# Patient Record
Sex: Female | Born: 1945
Health system: Southern US, Community
[De-identification: ages and names within clinical notes are randomized; demographics above are authoritative.]

## PROBLEM LIST (undated history)

## (undated) DIAGNOSIS — J300 Vasomotor rhinitis: Secondary | ICD-10-CM

## (undated) DIAGNOSIS — F419 Anxiety disorder, unspecified: Secondary | ICD-10-CM

## (undated) DIAGNOSIS — E7800 Pure hypercholesterolemia, unspecified: Secondary | ICD-10-CM

## (undated) DIAGNOSIS — K900 Celiac disease: Secondary | ICD-10-CM

## (undated) DIAGNOSIS — I959 Hypotension, unspecified: Secondary | ICD-10-CM

## (undated) DIAGNOSIS — J309 Allergic rhinitis, unspecified: Secondary | ICD-10-CM

## (undated) DIAGNOSIS — M1990 Unspecified osteoarthritis, unspecified site: Secondary | ICD-10-CM

## (undated) HISTORY — DX: Hypotension, unspecified: I95.9

## (undated) HISTORY — DX: Vasomotor rhinitis: J30.0

## (undated) HISTORY — DX: Anxiety disorder, unspecified: F41.9

## (undated) HISTORY — DX: Allergic rhinitis, unspecified: J30.9

## (undated) HISTORY — DX: Pure hypercholesterolemia, unspecified: E78.00

---

## 1979-01-06 HISTORY — PX: TUBAL LIGATION: SHX77

## 2001-01-05 HISTORY — PX: GANGLION CYST EXCISION: SHX1691

## 2001-05-20 ENCOUNTER — Other Ambulatory Visit: Admission: RE | Admit: 2001-05-20 | Discharge: 2001-05-20 | Payer: Self-pay | Admitting: Family Medicine

## 2002-08-08 ENCOUNTER — Encounter: Payer: Self-pay | Admitting: Gastroenterology

## 2002-08-29 ENCOUNTER — Encounter: Payer: Self-pay | Admitting: Gastroenterology

## 2002-08-29 ENCOUNTER — Encounter (INDEPENDENT_AMBULATORY_CARE_PROVIDER_SITE_OTHER): Payer: Self-pay | Admitting: *Deleted

## 2002-08-29 ENCOUNTER — Encounter: Admission: RE | Admit: 2002-08-29 | Discharge: 2002-08-29 | Payer: Self-pay | Admitting: Gastroenterology

## 2002-10-25 ENCOUNTER — Encounter (INDEPENDENT_AMBULATORY_CARE_PROVIDER_SITE_OTHER): Payer: Self-pay | Admitting: Specialist

## 2002-10-25 ENCOUNTER — Encounter (INDEPENDENT_AMBULATORY_CARE_PROVIDER_SITE_OTHER): Payer: Self-pay | Admitting: *Deleted

## 2002-10-25 ENCOUNTER — Encounter: Payer: Self-pay | Admitting: Gastroenterology

## 2002-10-25 ENCOUNTER — Ambulatory Visit (HOSPITAL_COMMUNITY): Admission: RE | Admit: 2002-10-25 | Discharge: 2002-10-25 | Payer: Self-pay | Admitting: Gastroenterology

## 2003-01-01 ENCOUNTER — Encounter (INDEPENDENT_AMBULATORY_CARE_PROVIDER_SITE_OTHER): Payer: Self-pay | Admitting: *Deleted

## 2003-01-01 ENCOUNTER — Observation Stay (HOSPITAL_COMMUNITY): Admission: RE | Admit: 2003-01-01 | Discharge: 2003-01-02 | Payer: Self-pay | Admitting: Surgery

## 2003-01-01 ENCOUNTER — Encounter (INDEPENDENT_AMBULATORY_CARE_PROVIDER_SITE_OTHER): Payer: Self-pay

## 2003-01-01 ENCOUNTER — Encounter: Payer: Self-pay | Admitting: Gastroenterology

## 2003-01-06 HISTORY — PX: GALLBLADDER SURGERY: SHX652

## 2004-08-29 ENCOUNTER — Other Ambulatory Visit: Admission: RE | Admit: 2004-08-29 | Discharge: 2004-08-29 | Payer: Self-pay | Admitting: Family Medicine

## 2006-01-08 ENCOUNTER — Other Ambulatory Visit: Admission: RE | Admit: 2006-01-08 | Discharge: 2006-01-08 | Payer: Self-pay | Admitting: Family Medicine

## 2006-08-26 ENCOUNTER — Encounter: Admission: RE | Admit: 2006-08-26 | Discharge: 2006-08-26 | Payer: Self-pay | Admitting: Otolaryngology

## 2007-03-10 ENCOUNTER — Other Ambulatory Visit: Admission: RE | Admit: 2007-03-10 | Discharge: 2007-03-10 | Payer: Self-pay | Admitting: Family Medicine

## 2007-04-29 ENCOUNTER — Emergency Department (HOSPITAL_COMMUNITY): Admission: EM | Admit: 2007-04-29 | Discharge: 2007-04-29 | Payer: Self-pay | Admitting: Emergency Medicine

## 2007-05-06 ENCOUNTER — Encounter: Admission: RE | Admit: 2007-05-06 | Discharge: 2007-05-06 | Payer: Self-pay | Admitting: Family Medicine

## 2007-05-10 ENCOUNTER — Encounter: Payer: Self-pay | Admitting: Gastroenterology

## 2007-05-11 ENCOUNTER — Encounter: Payer: Self-pay | Admitting: Gastroenterology

## 2007-05-18 ENCOUNTER — Encounter (INDEPENDENT_AMBULATORY_CARE_PROVIDER_SITE_OTHER): Payer: Self-pay | Admitting: *Deleted

## 2007-05-18 ENCOUNTER — Ambulatory Visit (HOSPITAL_COMMUNITY): Admission: RE | Admit: 2007-05-18 | Discharge: 2007-05-18 | Payer: Self-pay | Admitting: Gastroenterology

## 2007-05-26 ENCOUNTER — Encounter: Payer: Self-pay | Admitting: Gastroenterology

## 2007-06-08 ENCOUNTER — Encounter: Payer: Self-pay | Admitting: Gastroenterology

## 2007-06-24 ENCOUNTER — Encounter: Payer: Self-pay | Admitting: Gastroenterology

## 2007-07-14 ENCOUNTER — Encounter: Payer: Self-pay | Admitting: Gastroenterology

## 2007-08-03 ENCOUNTER — Encounter: Admission: RE | Admit: 2007-08-03 | Discharge: 2007-08-03 | Payer: Self-pay | Admitting: Gastroenterology

## 2007-08-03 ENCOUNTER — Encounter (INDEPENDENT_AMBULATORY_CARE_PROVIDER_SITE_OTHER): Payer: Self-pay | Admitting: *Deleted

## 2008-01-27 ENCOUNTER — Encounter: Admission: RE | Admit: 2008-01-27 | Discharge: 2008-01-27 | Payer: Self-pay | Admitting: Family Medicine

## 2008-09-07 ENCOUNTER — Encounter: Admission: RE | Admit: 2008-09-07 | Discharge: 2008-09-07 | Payer: Self-pay | Admitting: Family Medicine

## 2008-10-15 ENCOUNTER — Other Ambulatory Visit: Admission: RE | Admit: 2008-10-15 | Discharge: 2008-10-15 | Payer: Self-pay | Admitting: Family Medicine

## 2008-10-23 ENCOUNTER — Encounter: Admission: RE | Admit: 2008-10-23 | Discharge: 2008-10-23 | Payer: Self-pay | Admitting: Family Medicine

## 2009-04-05 ENCOUNTER — Encounter: Admission: RE | Admit: 2009-04-05 | Discharge: 2009-04-05 | Payer: Self-pay | Admitting: Family Medicine

## 2009-04-10 ENCOUNTER — Encounter: Admission: RE | Admit: 2009-04-10 | Discharge: 2009-04-10 | Payer: Self-pay | Admitting: Family Medicine

## 2009-11-18 ENCOUNTER — Encounter: Payer: Self-pay | Admitting: Gastroenterology

## 2009-12-18 ENCOUNTER — Encounter: Payer: Self-pay | Admitting: Gastroenterology

## 2010-01-07 ENCOUNTER — Encounter: Payer: Self-pay | Admitting: Gastroenterology

## 2010-01-27 ENCOUNTER — Encounter: Payer: Self-pay | Admitting: Gastroenterology

## 2010-02-06 NOTE — Letter (Signed)
Summary: New Patient letter  [facility]  [address] [place], [place] [address]   Phone: [phone]  Fax: [phone]       01/07/2010 MRN: [id number]  Shelly [name] [address] [place], [place]  [address]  Dear Ms. Kleist,  Welcome to the Gastroenterology Division at [facility].    You are scheduled to see Dr.  Russella Dar on 02-13-10 at 11am on the 3rd floor at [facility], [address]  We ask that you try to arrive at our office 15 minutes prior to your appointment time to allow for check-in.  We would like you to complete the enclosed self-administered evaluation form prior to your visit and bring it with you on the day of your appointment.  We will review it with you.  Also, please bring a complete list of all your medications or, if you prefer, bring the medication bottles and we will list them.  Please bring your insurance card so that we may make a copy of it.  If your insurance requires a referral to see a specialist, please bring your referral form from your primary care physician.  Co-payments are due at the time of your visit and may be paid by cash, check or credit card.     Your office visit will consist of a consult with your physician (includes a physical exam), any laboratory testing he/she may order, scheduling of any necessary diagnostic testing (e.g. x-ray, ultrasound, CT-scan), and scheduling of a procedure (e.g. Endoscopy, Colonoscopy) if required.  Please allow enough time on your schedule to allow for any/all of these possibilities.    If you cannot keep your appointment, please call [phone] to cancel or reschedule prior to your appointment date.  This allows [place] the opportunity to schedule an appointment for another patient in need of care.  If you do not cancel or reschedule by 5 p.m. the business day prior to your appointment date, you will be charged a $50.00 late cancellation/no-show fee.    Thank you for choosing [facility] for your medical needs.  We appreciate the opportunity to care for you.  Please visit [place] at our website  to learn more about our practice.                     Sincerely,                                                             The Gastroenterology Division

## 2010-02-13 ENCOUNTER — Ambulatory Visit (INDEPENDENT_AMBULATORY_CARE_PROVIDER_SITE_OTHER): Payer: BC Managed Care – PPO | Admitting: Gastroenterology

## 2010-02-13 ENCOUNTER — Encounter: Payer: Self-pay | Admitting: Gastroenterology

## 2010-02-13 DIAGNOSIS — R1033 Periumbilical pain: Secondary | ICD-10-CM

## 2010-02-13 DIAGNOSIS — R197 Diarrhea, unspecified: Secondary | ICD-10-CM | POA: Insufficient documentation

## 2010-02-13 DIAGNOSIS — F329 Major depressive disorder, single episode, unspecified: Secondary | ICD-10-CM | POA: Insufficient documentation

## 2010-02-13 DIAGNOSIS — F411 Generalized anxiety disorder: Secondary | ICD-10-CM | POA: Insufficient documentation

## 2010-02-20 NOTE — Assessment & Plan Note (Signed)
Summary: ABD APIN/YF - SAW DR MANN 2 YRS AGO DR GAGES WANTS HER TO SEE...   History of Present Illness Visit Type: Initial Consult Primary GI MD: Elie Goody MD [facility] Primary Provider: Shaune Pollack, MD Requesting Provider: Shaune Pollack, MD Chief Complaint: Patient c/o periumbilical abdominal pain as well as diarrhea. There has also been some nausea History of Present Illness:   This is a 65 year old female here today with her husband for a second opinion involving chronic, intermittent diarrhea, and abdominal pain. She's been followed by Dr. Loreta Ave for many years and has undergone extensive evaluations in 2004 and in 2009. I reviewed the results of the 2004 evaluation. However, I do not have any records from 2009 at this time. Patient states the 2009 workup included stool studies, celiac antibodies, colonoscopy and upper endoscopy. Recent stool studies and CMET per Dr. Kevan Ny were entirely unremarkable. Patient states she is intolerant to gluten, and since beginning a gluten-free diet her continuous, chronic diarrhea has abated.  Recently she has episodes about once per month of loose, watery, nonbloody diarrhea associated with periumbilical pain and nausea. Her symptoms tend to last for several days, then completely abate and then she is completely asymptomatic. She has been told of irritable bowel syndrome in the past.   GI Review of Systems    Reports abdominal pain, belching, loss of appetite, nausea, and  vomiting.     Location of  Abdominal pain: periumbilical.    Denies acid reflux, bloating, chest pain, dysphagia with liquids, dysphagia with solids, heartburn, vomiting blood, weight loss, and  weight gain.      Reports diarrhea.     Denies anal fissure, black tarry stools, change in bowel habit, constipation, diverticulosis, fecal incontinence, heme positive stool, hemorrhoids, irritable bowel syndrome, jaundice, light color stool, liver problems, rectal bleeding, and  rectal pain.    Current Medications (verified): 1)  Multivitamins   Tabs (Multiple Vitamin) .... One Tablet By Mouth Once Daily 2)  Vitamin D3 2000 Unit Caps (Cholecalciferol) .... One Capsule By Mouth Every Other Day 3)  Sudafed 30 Mg Tabs (Pseudoephedrine Hcl) .... 1/2 Tablet By Mouth As Needed 4)  Zofran 4 Mg Tabs (Ondansetron Hcl) .... One Tablet By Mouth As Needed 5)  Astepro 0.15 % Soln (Azelastine Hcl) .... One Spray in Each Nostril As Needed 6)  Ipratropium Bromide 0.03 % Soln (Ipratropium Bromide) .... 2 Applications in Each Nostril As Needed 7)  Zithromax Z-Pak 250 Mg Tabs (Azithromycin) .... Use As Directed 8)  Progesterone Cream .... Apply Per Vagina Every Night  Allergies: 1)  ! Penicillin 2)  ! * Levbid 3)  ! Prednisone 4)  ! Cortisone 5)  ! Sulfa 6)  ! * Gluten 7)  ! * Clindamycin  Past History:  Past Medical History: Reviewed history from 02/12/2010 and no changes required. Anxiety Disorder Depression Hyperlipidemia Vitamin D deficiency Allergic rhinitis Borderline delayed gastric emptying Hemorrhoids  Past Surgical History: Cholecystectomy 12/2002 Tubal Ligation Left wrist ganglion cyst removal  Family History: Reviewed history from 02/12/2010 and no changes required. Father: deceased 55 years prostate cancer Mother: deceased 78 years stroke, elevated cholesterol No FH of Colon Cancer:  Social History: Single and Widowed Retired Patient currently smokes. 4 cigs daily Alcohol Use - no Daily Caffeine Use 1 cups daily Illicit Drug Use - no  Review of Systems       The patient complains of allergy/sinus, back pain, cough, fatigue, and night sweats.  The pertinent positives and negatives are noted as above and in the HPI. All other ROS were reviewed and were negative.   Vital Signs:  Patient profile:   65 year old female Height:      65 inches Weight:      123.25 pounds BMI:     20.58 BSA:     1.61 Pulse rate:   100 / minute Pulse rhythm:    regular BP sitting:   96 / 64  (left arm)  Vitals Entered By: Lamona Curl CMA Duncan Dull) (February 13, 2010 10:51 AM)  Physical Exam  General:  Well developed, well nourished, no acute distress. Head:  Normocephalic and atraumatic. Eyes:  PERRLA, no icterus. Ears:  Normal auditory acuity. Mouth:  No deformity or lesions, dentition normal. Neck:  Supple; no masses or thyromegaly. Lungs:  Clear throughout to auscultation. Heart:  Regular rate and rhythm; no murmurs, rubs,  or bruits. Abdomen:  Soft, nontender and nondistended. No masses, hepatosplenomegaly or hernias noted. Normal bowel sounds. Msk:  Symmetrical with no gross deformities. Normal posture. Pulses:  Normal pulses noted. Extremities:  No clubbing, cyanosis, edema or deformities noted. Neurologic:  Alert and  oriented x4;  grossly normal neurologically. Cervical Nodes:  No significant cervical adenopathy. Inguinal Nodes:  No significant inguinal adenopathy. Psych:  Alert and cooperative. Anxious.    Impression & Recommendations:  Problem # 1:  DIARRHEA (ICD-787.91) Chronic, intermittent diarrhea. Previous continuous diarrhea resolved on a gluten-free diet. Requests records from Dr. Loreta Ave for further review before proceeding with any diagnostic testing. It seems quite likely that she has underlying irritable bowel syndrome and I recommended beginning an anti-spasmodic. She seems quite resistant to this diagnosis and is very skeptical that something else have been overlooked. She is reluctant to begin any medication because she states she frequently has side effects to "adult doses" of medications. Trial of glycopyrrolate 1 mg twice daily. Consider a trial probiotics. Consider further diagnostic evaluation and consider referral to a tertiary Medical Center based on review of Dr. Kenna Gilbert prior workup.  Problem # 2:  ABDOMINAL PAIN-PERIUMBILICAL (ICD-789.05) past intermittent periumbilical abdominal pain, associated with  diarrhea and nausea. Given the extensive evaluation to date. I think irritable bowel syndrome or functional abdominal pain is quite likely. Will consider further evaluation and consider referral to a tertiary medical center after review with Dr. Kenna Gilbert records.  Problem # 3:  ANXIETY (ICD-300.00)  Problem # 4:  DEPRESSION (ICD-311)  Patient Instructions: 1)  Pick up your prescription from your pharmacy.  2)  Faxed release of information to Dr. Kenna Gilbert office and we will obtain records of prior labs, x-rays, and procedure reports and pathologies. We will contact you after these records are obtained for further evaluation.  3)  Copy sent to : Shaune Pollack, MD 4)  The medication list was reviewed and reconciled.  All changed / newly prescribed medications were explained.  A complete medication list was provided to the patient / caregiver.  Prescriptions: GLYCOPYRROLATE 1 MG TABS (GLYCOPYRROLATE) one tablet by mouth two times a day  #60 x 5   Entered by:   Christie Nottingham CMA (AAMA)   Authorized by:   Meryl Dare MD [facility]   Signed by:   Christie Nottingham CMA (AAMA) on 02/13/2010   Method used:   Electronically to        [facility]  [phone]* (retail)       [address]       [place]  [place], [place]  [address]       Ph: [phone] or [phone]       Fax: [phone]   RxID:   [id number] GLYCOPYRROLATE 1 MG TABS (GLYCOPYRROLATE) one tablet by mouth two times a day  #60 x 5   Entered by:   Christie Nottingham CMA (AAMA)   Authorized by:   Meryl Dare MD [facility]   Signed by:   Christie Nottingham CMA (AAMA) on 02/13/2010   Method used:   Electronically to        [facility]  [phone]* (retail)       [address]       [place], [place]  [address]       Ph: [phone] or [phone]       Fax: [phone]   RxID:   [id number]

## 2010-02-20 NOTE — Op Note (Signed)
Summary: Colonoscopy and biopsy    NAME:  Shelly Shelly [name], Shelly Shelly [name]                        ACCOUNT NO.:  [id number]   MEDICAL RECORD NO.:  [id number]                   PATIENT TYPE:  AMB   LOCATION:  ENDO                                 FACILITY:  [facility]   PHYSICIAN:  Anselmo Rod, M.D.               DATE OF BIRTH:  1945-04-19   DATE OF PROCEDURE:  10/25/2002  DATE OF DISCHARGE:                                 OPERATIVE REPORT   PROCEDURE PERFORMED:  Colonoscopy with multiple biopsies.   ENDOSCOPIST:  Charna Elizabeth, M.D.   INSTRUMENT USED:  Olympus video colonoscope.   INDICATIONS FOR PROCEDURE:  The patient is Shelly [name] 65 year old white female  undergoing screening colonoscopy.  The patient has Shelly [name] history of occasional  rectal bleeding interspersed with bouts of diarrhea.   PREPROCEDURE PREPARATION:  Informed consent was procured from the patient.  The patient was fasted for eight hours prior to the procedure and prepped  with Shelly [name] bottle of magnesium citrate and Shelly [name] gallon of GoLYTELY the night prior  to the procedure.   PREPROCEDURE PHYSICAL:  The patient had stable vital signs.  Neck supple.  Chest clear to auscultation.  S1 and S2 regular.  Abdomen soft with normal  bowel sounds.   DESCRIPTION OF PROCEDURE:  The patient was placed in left lateral decubitus  position and sedated with 40 mg of Demerol and 4 mg of Versed intravenously.  Once the patient was adequately sedated and maintained on low flow oxygen  and continuous cardiac monitoring, the Olympus video colonoscope was  advanced from the rectum to the cecum and terminal ileum without difficulty.  The appendicular orifice and ileocecal valve were clearly visualized and  photographed.  Multiple erosions were seen in the terminal ileum and these  were biopsied for pathology.  Shelly [name] small polyp was seen in the base of the  cecum.  Some erythema was noted around the appendicular orifice and this was  biopsied for pathology as well considering  the patient's history of  diarrhea.  No masses, polyps, or diverticulosis were seen in the right  colon, transverse colon, left colon.  Two small polyps were biopsied from  the rectum.  Small internal hemorrhoids were seen on retroflexion and anal  inspection respectively.  Random colon biopsies were done to rule out  colitis versus microscopic colitis.   IMPRESSION:  1. Bleeding internal and external hemorrhoids.  2. Random colon biopsies done to rule out colitis versus lymphocytic     colitis.  3. Two small polyps biopsied from the rectum.  4. Polyp biopsied from the cecal base.  Biopsies done from the appendicular     orifice.  5. Scattered erosions in terminal ileum biopsied.   RECOMMENDATIONS:  1. Await pathology results.  2. Avoid all nonsteroidals including aspirin for now.  3. Outpatient follow-up in the next two weeks for further recommendations.  4. Continue high fiber diet for  now.                                                   Anselmo Rod, M.D.    JNM/MEDQ  D:  10/25/2002  T:  10/25/2002  Job:  [id number]   cc:   Duncan Dull, M.D.  [address]  [place]  [place] [address]  Fax: [phone]       SP Surgical Pathology - STATUS: Final             By: Windy Kalata,      Perform Date: 20Oct04 00:00  Ordered By: Cassandria Anger Date: 20Oct04 15:50  Facility: [facility]                              Department: CPATH  Service Report Text  [facility]   [address]   [place], [place] [address]   [phone]    REPORT OF SURGICAL PATHOLOGY    Case #: [id number]   Patient Name: Shelly Shelly [name], Shelly Shelly [name].   PID: [id number]   Pathologist: Lyn Hollingshead. Delila Spence, MD   DOB/Age April 07, 1945 (Age: 22) Gender: F   Date Taken: 10/25/2002   Date Received: 10/25/2002    FINAL DIAGNOSIS    ***MICROSCOPIC EXAMINATION AND DIAGNOSIS***    1. COLON BIOPSY, APPENDICAL ORIFICE: NO ACTIVE MUCOSAL COLITIS,    DYSPLASIA OR MALIGNANCY IDENTIFIED.    2. TERMINAL ILEUM, BIOPSY: FRAGMENTS OF TERMINAL ILEUM AND   COLONIC TYPE EPITHELIUM WITH FOCAL ACTIVE MUCOSAL INFLAMMATION.   NO DYSPLASIA, GRANULOMAS OR MALIGNANCY IDENTIFIED.    3. COLON, BIOPSY: BENIGN COLONIC MUCOSA. NO SIGNIFICANT   INFLAMMATION OR OTHER ABNORMALITIES IDENTIFIED.    4. rectum: HYPERPLASTIC POLYP(S). NO ADENOMATOUS CHANGE OR   MALIGNANCY IDENTIFIED.    COMMENT   2. The ileocecal valve type mucosa shows focal active mucosal   inflammation with associated mucus depletion and Shelly [name] focal increase   in inflammatory cells in the lamina propria. No granulomas are   seen. These findings may be seen with Crohn's disease. However,   another important differential diagnostic consideration is NSAIDS   related mucosal injury. Clinical and endoscopic correlation would   be helpful. EAA:mw 10-25-02    3. There is colonic mucosa with normal crypt architecture and no   objective increase in inflammation. No active inflammation,   microscopic colitis, collagenous colitis or significant chronic   changes identified. No hyperplastic or adenomatous changes are   seen, and there is no evidence of malignancy.    mw   Date Reported: 10/27/2002 Alden Server Shelly [name]. Delila Spence, MD   *** Electronically Signed Out By EAA ***    Clinical information   R/O collagenous colitis, diarrhea (mw)    specimen(s) obtained   1: Colon, biopsy, appendical orifice   2: Ileum, biopsy, lesions   3: Colon, biopsy, random   4: Rectum, polyp(s)    Gross Description   1. Received in formalin is Shelly [name] tan, soft tissue fragment that is   submitted in toto. Size: 0.2 cm    2. Received in formalin are tan, soft tissue fragments that are   submitted in toto. Number: 4   Size: 0.2  to 0.4 cm    3. Received in formalin are tan, soft tissue fragments that are   submitted in toto. Number: 5   Size: 0.2 to 0.3 cm    4. Received in formalin are tan, soft tissue fragments that are    submitted in toto. Number: 2   Size: each 0.3 cm (JBM:kcv 10-20)    kv/

## 2010-02-20 NOTE — Op Note (Signed)
Summary: Laparoscopic Cholecystectomy   NAME:  Shelly, [name]                        ACCOUNT NO.:  [id number]   MEDICAL RECORD NO.:  [id number]                   PATIENT TYPE:  AMB   LOCATION:  DAY                                  FACILITY:  [facility]   PHYSICIAN:  Abigail Miyamoto, M.D.              DATE OF BIRTH:  [birth year]   DATE OF PROCEDURE:  01/01/2003  DATE OF DISCHARGE:                                 OPERATIVE REPORT   PREOPERATIVE DIAGNOSIS:  Biliary dyskinesia.   POSTOPERATIVE DIAGNOSIS:  Biliary dyskinesia.   PROCEDURE:  Laparoscopic cholecystectomy with intraoperative cholangiogram.   SURGEON:  Abigail Miyamoto, M.D.   ANESTHESIA:  General endotracheal anesthesia, 0.25% Marcaine.   ESTIMATED BLOOD LOSS:  Minimal.   FINDINGS:  The patient was found to have a normal cholangiogram.   PROCEDURE IN DETAIL:  The patient was brought to the operating room,  identified as Shelly [name].  She was placed supine on the operating table,  and general anesthesia was induced.  Her abdomen was then prepped and draped  in the usual sterile fashion.  Using a #15 blade, a small transverse  incision was made below the umbilicus.  The incision was carried down to the  fascia, which was then opened with a scalpel.  A hemostat was then used to  pass into the peritoneal cavity.  Next a 0 Vicryl pursestring suture was  placed around the fascial opening.  The Hasson port was placed through the  opening and insufflation of the abdomen was begun.  A 12 mm port was then  placed in the patient's epigastrium and two 5 mm ports were placed in the  patient's right flank under direct vision.  The gallbladder was then grasped  and retracted above the liver bed.  Dissection was then carried down to the  base of the gallbladder.  The cystic duct was easily dissected out and  clipped once distally.  It was then partly opened with the laparoscopic  scissors.  An angiocatheter was then inserted in the  right upper quadrant  under direct vision.  The cholangiocatheter was then passed through this and  placed into the opening into the cystic duct.  A cholangiogram was then  performed with contrast under direct fluoroscopy.  Contrast was seen to flow  into the entire biliary system and duodenum without evidence of  abnormalities.  The cholangiocatheter was then completely removed from the  abdomen.  The cystic duct was then clipped three times proximally and  transected with the scissors.  The cystic artery was dissected out, clipped  twice proximally, once distally, and transected with the scissors.  The  gallbladder was slowly dissected free from the liver bed with the  electrocautery.  Once it was free from the liver bed, the liver bed was  examined and hemostasis was achieved with the cautery.  The gallbladder was  then removed through the incision at the umbilicus.  The 0 Vicryl at the  umbilicus was tied in place, closing the fascial defect.  All ports were  then removed under direct vision and the abdomen was deflated.  All  incisions were anesthetized with 0.25%  Marcaine and closed with 4-0 Monocryl subcuticular sutures.  Steri-Strips,  gauze, and tape were then applied.  The patient tolerated the procedure  well.  All sponge, needle, and instrument counts were correct at the end of  the procedure.  The patient was then extubated in the operating room and  taken in stable condition to the recovery room.                                               Abigail Miyamoto, M.D.    DB/MEDQ  D:  01/01/2003  T:  01/01/2003  Job:  [id number]   cc:   Anselmo Rod, M.D.  Fax: [phone]        SP Surgical Pathology - STATUS: Final             By: Guilford Shi MD , Clovis Pu       Perform Date: 27Dec04 00:00  Ordered By: Forrestine Him Date: 27Dec04 11:15  Facility: [facility]                              Department: CPATH  Service Report Text  [facility]   [address] [place], [place] [address]   [phone]    REPORT OF SURGICAL PATHOLOGY    Case #: [id number]   Patient Name: Shelly [name], [name]   PID: [id number]   Pathologist: Marcie Bal, MD   DOB/Age Jul 04, 1945 (Age: 65) Gender: F   Date Taken: 01/01/2003   Date Received: 01/01/2003    FINAL DIAGNOSIS    ***MICROSCOPIC EXAMINATION AND DIAGNOSIS***    GALLBLADDER: MILD NONSPECIFIC CHRONIC CHOLECYSTITIS, CLINICALLY   BILIARY DYSKINESIA.    cf   Date Reported: 01/02/2003 Marcie Bal, MD   *** Electronically Signed Out By TAZ ***    Clinical information   Biliary dyskinesia. (mw)    specimen(s) obtained   Gallbladder    Gross Description   Size/?Intact: 7.3 x 3 x 1.9 cm, intact   Serosal surface: Pink, hyperemic, focally roughened   Mucosa/Wall: The mucosa is tan-green to hyperemic, smooth, soft.   Contents: 0.2 cm thick, no gross stones   Cystic duct: Patent   Block Summary: 1 block (SW:jy) 01/01/03    jy/

## 2010-02-26 NOTE — Letter (Signed)
Summary: [facility]   Imported By: Sherian Rein 02/18/2010 11:46:27  _____________________________________________________________________  External Attachment:    Type:   Image     Comment:   External Document

## 2010-02-26 NOTE — Letter (Signed)
Summary: [facility]   Imported By: Sherian Rein 02/18/2010 11:43:10  _____________________________________________________________________  External Attachment:    Type:   Image     Comment:   External Document

## 2010-03-13 NOTE — Letter (Signed)
Summary: [facility]  [facility]   Imported By: Sherian Rein 03/07/2010 07:13:08  _____________________________________________________________________  External Attachment:    Type:   Image     Comment:   External Document

## 2010-03-13 NOTE — Procedures (Signed)
Summary: EGD/[facility]  EGD/[facility]   Imported By: Sherian Rein 03/07/2010 07:12:13  _____________________________________________________________________  External Attachment:    Type:   Image     Comment:   External Document

## 2010-03-13 NOTE — Procedures (Signed)
Summary: Colonoscopy/[facility]  Colonoscopy/[facility]   Imported By: Sherian Rein 03/07/2010 07:11:18  _____________________________________________________________________  External Attachment:    Type:   Image     Comment:   External Document

## 2010-03-13 NOTE — Letter (Signed)
Summary: [facility]  [facility]   Imported By: Sherian Rein 03/07/2010 07:15:23  _____________________________________________________________________  External Attachment:    Type:   Image     Comment:   External Document

## 2010-03-13 NOTE — Letter (Signed)
Summary: [facility]  [facility]   Imported By: Sherian Rein 03/07/2010 07:14:32  _____________________________________________________________________  External Attachment:    Type:   Image     Comment:   External Document

## 2010-03-13 NOTE — Letter (Signed)
Summary: [facility]  [facility]   Imported By: Sherian Rein 03/07/2010 07:13:48  _____________________________________________________________________  External Attachment:    Type:   Image     Comment:   External Document

## 2010-03-13 NOTE — Letter (Signed)
Summary: [facility]  [facility]   Imported By: Sherian Rein 03/07/2010 07:04:24  _____________________________________________________________________  External Attachment:    Type:   Image     Comment:   External Document

## 2010-05-05 ENCOUNTER — Other Ambulatory Visit: Payer: Self-pay | Admitting: Family Medicine

## 2010-05-05 DIAGNOSIS — R918 Other nonspecific abnormal finding of lung field: Secondary | ICD-10-CM

## 2010-05-12 ENCOUNTER — Other Ambulatory Visit: Payer: BC Managed Care – PPO

## 2010-05-23 NOTE — Op Note (Signed)
NAME:  Shelly [name], Shelly [name]                        ACCOUNT NO.:  [id number]   MEDICAL RECORD NO.:  [id number]                   PATIENT TYPE:  AMB   LOCATION:  DAY                                  FACILITY:  [facility]   PHYSICIAN:  Abigail Miyamoto, M.D.              DATE OF BIRTH:  [birth year]   DATE OF PROCEDURE:  01/01/2003  DATE OF DISCHARGE:                                 OPERATIVE REPORT   PREOPERATIVE DIAGNOSIS:  Biliary dyskinesia.   POSTOPERATIVE DIAGNOSIS:  Biliary dyskinesia.   PROCEDURE:  Laparoscopic cholecystectomy with intraoperative cholangiogram.   SURGEON:  Abigail Miyamoto, M.D.   ANESTHESIA:  General endotracheal anesthesia, 0.25% Marcaine.   ESTIMATED BLOOD LOSS:  Minimal.   FINDINGS:  The patient was found to have a normal cholangiogram.   PROCEDURE IN DETAIL:  The patient was brought to the operating room,  identified as [name].  She was placed supine on the operating table,  and general anesthesia was induced.  Her abdomen was then prepped and draped  in the usual sterile fashion.  Using a #15 blade, a small transverse  incision was made below the umbilicus.  The incision was carried down to the  fascia, which was then opened with a scalpel.  A hemostat was then used to  pass into the peritoneal cavity.  Next a 0 Vicryl pursestring suture was  placed around the fascial opening.  The Hasson port was placed through the  opening and insufflation of the abdomen was begun.  A 12 mm port was then  placed in the patient's epigastrium and two 5 mm ports were placed in the  patient's right flank under direct vision.  The gallbladder was then grasped  and retracted above the liver bed.  Dissection was then carried down to the  base of the gallbladder.  The cystic duct was easily dissected out and  clipped once distally.  It was then partly opened with the laparoscopic  scissors.  An angiocatheter was then inserted in the right upper quadrant  under direct vision.   The cholangiocatheter was then passed through this and  placed into the opening into the cystic duct.  A cholangiogram was then  performed with contrast under direct fluoroscopy.  Contrast was seen to flow  into the entire biliary system and duodenum without evidence of  abnormalities.  The cholangiocatheter was then completely removed from the  abdomen.  The cystic duct was then clipped three times proximally and  transected with the scissors.  The cystic artery was dissected out, clipped  twice proximally, once distally, and transected with the scissors.  The  gallbladder was slowly dissected free from the liver bed with the  electrocautery.  Once it was free from the liver bed, the liver bed was  examined and hemostasis was achieved with the cautery.  The gallbladder was  then removed through the incision at the umbilicus.  The 0 Vicryl at  the  umbilicus was tied in place, closing the fascial defect.  All ports were  then removed under direct vision and the abdomen was deflated.  All  incisions were anesthetized with 0.25%  Marcaine and closed with 4-0 Monocryl subcuticular sutures.  Steri-Strips,  gauze, and tape were then applied.  The patient tolerated the procedure  well.  All sponge, needle, and instrument counts were correct at the end of  the procedure.  The patient was then extubated in the operating room and  taken in stable condition to the recovery room.                                               Abigail Miyamoto, M.D.    DB/MEDQ  D:  01/01/2003  T:  01/01/2003  Job:  [id number]   cc:   Anselmo Rod, M.D.  Fax: [phone]

## 2010-05-23 NOTE — Op Note (Signed)
NAME:  Shelly [name], Shelly [name]                        ACCOUNT NO.:  [id number]   MEDICAL RECORD NO.:  [id number]                   PATIENT TYPE:  AMB   LOCATION:  ENDO                                 FACILITY:  [facility]   PHYSICIAN:  Anselmo Rod, M.D.               DATE OF BIRTH:  1945-02-25   DATE OF PROCEDURE:  10/25/2002  DATE OF DISCHARGE:                                 OPERATIVE REPORT   PROCEDURE PERFORMED:  Colonoscopy with multiple biopsies.   ENDOSCOPIST:  Charna Elizabeth, M.D.   INSTRUMENT USED:  Olympus video colonoscope.   INDICATIONS FOR PROCEDURE:  The patient is a 66 year old white female  undergoing screening colonoscopy.  The patient has a history of occasional  rectal bleeding interspersed with bouts of diarrhea.   PREPROCEDURE PREPARATION:  Informed consent was procured from the patient.  The patient was fasted for eight hours prior to the procedure and prepped  with a bottle of magnesium citrate and a gallon of GoLYTELY the night prior  to the procedure.   PREPROCEDURE PHYSICAL:  The patient had stable vital signs.  Neck supple.  Chest clear to auscultation.  S1 and S2 regular.  Abdomen soft with normal  bowel sounds.   DESCRIPTION OF PROCEDURE:  The patient was placed in left lateral decubitus  position and sedated with 40 mg of Demerol and 4 mg of Versed intravenously.  Once the patient was adequately sedated and maintained on low flow oxygen  and continuous cardiac monitoring, the Olympus video colonoscope was  advanced from the rectum to the cecum and terminal ileum without difficulty.  The appendicular orifice and ileocecal valve were clearly visualized and  photographed.  Multiple erosions were seen in the terminal ileum and these  were biopsied for pathology.  A small polyp was seen in the base of the  cecum.  Some erythema was noted around the appendicular orifice and this was  biopsied for pathology as well considering the patient's history of  diarrhea.   No masses, polyps, or diverticulosis were seen in the right  colon, transverse colon, left colon.  Two small polyps were biopsied from  the rectum.  Small internal hemorrhoids were seen on retroflexion and anal  inspection respectively.  Random colon biopsies were done to rule out  colitis versus microscopic colitis.   IMPRESSION:  1. Bleeding internal and external hemorrhoids.  2. Random colon biopsies done to rule out colitis versus lymphocytic     colitis.  3. Two small polyps biopsied from the rectum.  4. Polyp biopsied from the cecal base.  Biopsies done from the appendicular     orifice.  5. Scattered erosions in terminal ileum biopsied.   RECOMMENDATIONS:  1. Await pathology results.  2. Avoid all nonsteroidals including aspirin for now.  3. Outpatient follow-up in the next two weeks for further recommendations.  4. Continue high fiber diet for now.  Anselmo Rod, M.D.    JNM/MEDQ  D:  10/25/2002  T:  10/25/2002  Job:  [id number]   cc:   Duncan Dull, M.D.  [address]  [place]  [place] [address]  Fax: [phone]

## 2010-09-30 LAB — COMPREHENSIVE METABOLIC PANEL
ALT: 19
AST: 23
Albumin: 3.6
Alkaline Phosphatase: 60
BUN: 13
CO2: 27
Calcium: 8.4
Chloride: 108
Creatinine, Ser: 0.81
GFR calc Af Amer: >60
GFR calc non Af Amer: >60
Glucose, Bld: 113 — ABNORMAL HIGH
Potassium: 3.8
Sodium: 141
Total Bilirubin: 0.2 — ABNORMAL LOW
Total Protein: 6.1

## 2010-09-30 LAB — DIFFERENTIAL
Basophils Absolute: 0
Basophils Relative: 0
Eosinophils Absolute: 0.1
Eosinophils Relative: 1
Lymphocytes Relative: 8 — ABNORMAL LOW
Lymphs Abs: 1.3
Monocytes Absolute: 0.9
Monocytes Relative: 5
Neutro Abs: 14.2 — ABNORMAL HIGH
Neutrophils Relative %: 86 — ABNORMAL HIGH

## 2010-09-30 LAB — CBC
HCT: 36.7
Hemoglobin: 12.2
MCHC: 33.3
MCV: 95.8
Platelets: 362
RBC: 3.83 — ABNORMAL LOW
RDW: 12.3
WBC: 16.4 — ABNORMAL HIGH

## 2010-09-30 LAB — URINALYSIS, ROUTINE W REFLEX MICROSCOPIC
Bilirubin Urine: NEGATIVE
Glucose, UA: NEGATIVE
Hgb urine dipstick: NEGATIVE
Ketones, ur: NEGATIVE
Nitrite: NEGATIVE
Protein, ur: NEGATIVE
Specific Gravity, Urine: 1.018
Urobilinogen, UA: 0.2
pH: 7

## 2010-09-30 LAB — POCT CARDIAC MARKERS
CKMB, poc: <1 — ABNORMAL LOW
CKMB, poc: <1 — ABNORMAL LOW
Myoglobin, poc: 55.2
Myoglobin, poc: 72.3
Operator id: 272551
Operator id: 272551
Troponin i, poc: <0.05
Troponin i, poc: <0.05

## 2010-10-20 ENCOUNTER — Other Ambulatory Visit: Payer: BC Managed Care – PPO

## 2010-10-23 ENCOUNTER — Ambulatory Visit
Admission: RE | Admit: 2010-10-23 | Discharge: 2010-10-23 | Disposition: A | Discharge status: Home / Self Care | Payer: BC Managed Care – PPO | Source: Ambulatory Visit | Attending: Family Medicine | Admitting: Family Medicine

## 2010-10-23 DIAGNOSIS — R918 Other nonspecific abnormal finding of lung field: Secondary | ICD-10-CM

## 2010-10-23 MED ORDER — IOHEXOL 300 MG/ML  SOLN: 75.0000 mL | Freq: Once | INTRAMUSCULAR | Status: AC | PRN

## 2011-08-31 ENCOUNTER — Encounter: Payer: Self-pay | Admitting: Pulmonary Disease

## 2011-08-31 ENCOUNTER — Ambulatory Visit (INDEPENDENT_AMBULATORY_CARE_PROVIDER_SITE_OTHER): Payer: Medicare Other | Admitting: Pulmonary Disease

## 2011-08-31 VITALS — BP 98/60 | HR 84 | Temp 98.7°F | Ht 65.0 in | Wt 125.6 lb

## 2011-08-31 DIAGNOSIS — R042 Hemoptysis: Secondary | ICD-10-CM | POA: Insufficient documentation

## 2011-08-31 DIAGNOSIS — R918 Other nonspecific abnormal finding of lung field: Secondary | ICD-10-CM | POA: Insufficient documentation

## 2011-08-31 NOTE — Progress Notes (Signed)
  Subjective:    Patient ID: Shelly [name], female    DOB: [birth year], 66 y.o.   MRN: [id number]  HPI The patient is a very pleasant 66 year old female who I've been asked to see for hemoptysis.  Approximately one week ago she started having a significant cough with what she describes as "chunky mucus", but she is unable to describe if this was purulent in nature.  2 days later she began to cough up quarter-sized mucus with bright red blood, and did this multiple times over approximately 72 hours.  She currently is now bringing up scant mucus with very small streaks of bright red blood.  The patient did have a sinus infection approximately a month ago, and noticed that she did have some blood admixed in her nasal drainage.  She was treated with an antibiotic, and feels that she is better.  She currently is having no epistaxis, but does have persistent postnasal drip.  She denies any shortness of breath, chest pain, or lower extremity edema.  She has no history of thromboembolic disease.  She does have a history of pulmonary nodules that are being followed with serial CT chest, the last of which was done in October of last year.  2 of the nodules were stable from 2010, and one grew by 1.5 mm.  The patient does have a history of smoking one half pack per day for 50 years, but she tells me that her health maintenance is up to date.  She has no history of TB exposure, and has never had a positive PPD.   Review of Systems  Constitutional: Negative for fever and unexpected weight change.  HENT: Positive for congestion, rhinorrhea, sneezing, trouble swallowing and sinus pressure. Negative for ear pain, nosebleeds, sore throat, dental problem and postnasal drip.   Eyes: Negative for redness and itching.  Respiratory: Positive for cough and chest tightness. Negative for shortness of breath and wheezing.   Cardiovascular: Negative for palpitations and leg swelling.  Gastrointestinal: Negative for nausea and  vomiting.  Genitourinary: Negative for dysuria.  Musculoskeletal: Negative for joint swelling.  Skin: Negative for rash.  Neurological: Negative for headaches.  Hematological: Bruises/bleeds easily.  Psychiatric/Behavioral: Negative for dysphoric mood. The patient is not nervous/anxious.        Objective:   Physical Exam Constitutional:  Well developed, no acute distress  HENT:  Nares patent without discharge  Oropharynx without exudate, palate and uvula are normal  Eyes:  Perrla, eomi, no scleral icterus  Neck:  No JVD, no TMG  Cardiovascular:  Normal rate, regular rhythm, no rubs or gallops.  No murmurs        Intact distal pulses  Pulmonary :  Normal breath sounds, no stridor or respiratory distress   No rales, rhonchi, or wheezing  Abdominal:  Soft, nondistended, bowel sounds present.  No tenderness noted.   Musculoskeletal:  No lower extremity edema noted.  Lymph Nodes:  No cervical lymphadenopathy noted  Skin:  No cyanosis noted  Neurologic:  Alert, appropriate, moves all 4 extremities without obvious deficit.         Assessment & Plan:

## 2011-08-31 NOTE — Assessment & Plan Note (Signed)
The patient has a history of pulmonary nodules, with very little change over a year and a half.  I suspect this has nothing to do with her history of hemoptysis, however she is due for a followup CT in another month anyway.  I will go ahead and order a CT chest, and will discuss with her when the results are available.

## 2011-08-31 NOTE — Patient Instructions (Addendum)
Will schedule for scan of chest to followup prior nodules given your new symptoms.  Will call you with results, and decide on a plan from there.

## 2011-08-31 NOTE — Assessment & Plan Note (Signed)
The patient gives a history for hemoptysis of significant quantity for approximately 72 hours.  It has now almost totally resolved, and she is just bringing up a few streaks over the last 48 hours.  She did have a sinus infection about a month ago, and was treated with antibiotics, and she also had a worsening cough with "chunky" mucus 2 days prior to bringing up the blood.  It is unclear whether this is coming from her sinuses, or from her lower airway.  She does have a long history of smoking, and also an abnormal CT, and therefore have to be aggressive with our evaluation.  If her followup CT is unremarkable, I would probably just follow her and see how she responds.  She understands there is about a 2-3% incidence of an endobronchial process that is not seen by x-rays in patients with a hemoptysis that have a long history of smoking.  I would certainly do an airway exam if she has a recurrence of her symptoms.

## 2011-09-02 ENCOUNTER — Ambulatory Visit (INDEPENDENT_AMBULATORY_CARE_PROVIDER_SITE_OTHER)
Admission: RE | Admit: 2011-09-02 | Discharge: 2011-09-02 | Disposition: A | Discharge status: Home / Self Care | Payer: Medicare Other | Source: Ambulatory Visit | Attending: Pulmonary Disease | Admitting: Pulmonary Disease

## 2011-09-02 ENCOUNTER — Institutional Professional Consult (permissible substitution): Payer: BC Managed Care – PPO | Admitting: Pulmonary Disease

## 2011-09-02 DIAGNOSIS — R918 Other nonspecific abnormal finding of lung field: Secondary | ICD-10-CM

## 2011-09-02 DIAGNOSIS — R042 Hemoptysis: Secondary | ICD-10-CM

## 2011-12-28 ENCOUNTER — Other Ambulatory Visit (HOSPITAL_COMMUNITY)
Admission: RE | Admit: 2011-12-28 | Discharge: 2011-12-28 | Disposition: A | Discharge status: Home / Self Care | Payer: Medicare Other | Source: Ambulatory Visit | Attending: Family Medicine | Admitting: Family Medicine

## 2011-12-28 ENCOUNTER — Other Ambulatory Visit: Payer: Self-pay | Admitting: Family Medicine

## 2011-12-28 DIAGNOSIS — Z01419 Encounter for gynecological examination (general) (routine) without abnormal findings: Secondary | ICD-10-CM | POA: Insufficient documentation

## 2012-07-27 ENCOUNTER — Other Ambulatory Visit: Payer: Self-pay | Admitting: Family Medicine

## 2013-06-22 ENCOUNTER — Encounter (HOSPITAL_COMMUNITY): Payer: Self-pay | Admitting: *Deleted

## 2013-06-22 ENCOUNTER — Encounter (HOSPITAL_COMMUNITY): Payer: Self-pay | Admitting: Pharmacy Technician

## 2013-06-26 ENCOUNTER — Other Ambulatory Visit: Payer: Self-pay | Admitting: Gastroenterology

## 2013-06-26 NOTE — Addendum Note (Signed)
Addended by: Wesson Stith on: 06/26/2013 04:05 PM   Modules accepted: Orders  

## 2013-06-28 ENCOUNTER — Encounter (HOSPITAL_COMMUNITY): Payer: Self-pay | Admitting: *Deleted

## 2013-06-28 ENCOUNTER — Encounter (HOSPITAL_COMMUNITY): Payer: Medicare Other | Admitting: Anesthesiology

## 2013-06-28 ENCOUNTER — Encounter (HOSPITAL_COMMUNITY)
Admission: RE | Disposition: A | Discharge status: Home / Self Care | Payer: Self-pay | Source: Ambulatory Visit | Attending: Gastroenterology

## 2013-06-28 ENCOUNTER — Ambulatory Visit (HOSPITAL_COMMUNITY)
Admission: RE | Admit: 2013-06-28 | Discharge: 2013-06-28 | Disposition: A | Discharge status: Home / Self Care | Payer: Medicare Other | Source: Ambulatory Visit | Attending: Gastroenterology | Admitting: Gastroenterology

## 2013-06-28 ENCOUNTER — Ambulatory Visit (HOSPITAL_COMMUNITY): Payer: Medicare Other | Admitting: Anesthesiology

## 2013-06-28 DIAGNOSIS — F3289 Other specified depressive episodes: Secondary | ICD-10-CM | POA: Insufficient documentation

## 2013-06-28 DIAGNOSIS — R1011 Right upper quadrant pain: Secondary | ICD-10-CM | POA: Insufficient documentation

## 2013-06-28 DIAGNOSIS — F329 Major depressive disorder, single episode, unspecified: Secondary | ICD-10-CM | POA: Insufficient documentation

## 2013-06-28 DIAGNOSIS — F17200 Nicotine dependence, unspecified, uncomplicated: Secondary | ICD-10-CM | POA: Insufficient documentation

## 2013-06-28 DIAGNOSIS — F411 Generalized anxiety disorder: Secondary | ICD-10-CM | POA: Insufficient documentation

## 2013-06-28 HISTORY — DX: Celiac disease: K90.0

## 2013-06-28 HISTORY — PX: EUS: SHX5427

## 2013-06-28 HISTORY — DX: Unspecified osteoarthritis, unspecified site: M19.90

## 2013-06-28 SURGERY — ESOPHAGEAL ENDOSCOPIC ULTRASOUND (EUS) RADIAL
Anesthesia: Monitor Anesthesia Care

## 2013-06-28 MED ORDER — KETAMINE HCL 10 MG/ML IJ SOLN
INTRAMUSCULAR | Status: DC | PRN
  Administered 2013-06-28: 20 mg via INTRAVENOUS

## 2013-06-28 MED ORDER — LIDOCAINE HCL (CARDIAC) 20 MG/ML IV SOLN
INTRAVENOUS | Status: DC | PRN
  Administered 2013-06-28: 100 mg via INTRAVENOUS

## 2013-06-28 MED ORDER — PROPOFOL 10 MG/ML IV BOLUS
INTRAVENOUS | Status: DC | PRN
  Administered 2013-06-28: 20 mg via INTRAVENOUS
  Administered 2013-06-28: 10 mg via INTRAVENOUS
  Administered 2013-06-28: 20 mg via INTRAVENOUS

## 2013-06-28 MED ORDER — LIDOCAINE HCL (CARDIAC) 20 MG/ML IV SOLN
INTRAVENOUS | Status: AC
  Filled 2013-06-28: qty 5

## 2013-06-28 MED ORDER — LACTATED RINGERS IV SOLN
INTRAVENOUS | Status: DC
  Administered 2013-06-28: 1000 mL via INTRAVENOUS

## 2013-06-28 MED ORDER — PROPOFOL 10 MG/ML IV BOLUS
INTRAVENOUS | Status: AC
Start: 2013-06-28 — End: 2013-06-28
  Filled 2013-06-28: qty 20

## 2013-06-28 MED ORDER — KETAMINE HCL 10 MG/ML IJ SOLN
INTRAMUSCULAR | Status: AC
  Filled 2013-06-28: qty 1

## 2013-06-28 MED ORDER — PROPOFOL INFUSION 10 MG/ML OPTIME
INTRAVENOUS | Status: DC | PRN
  Administered 2013-06-28: 120 ug/kg/min via INTRAVENOUS

## 2013-06-28 MED ORDER — SODIUM CHLORIDE 0.9 % IV SOLN: INTRAVENOUS | Status: DC

## 2013-06-28 MED ORDER — PROPOFOL 10 MG/ML IV BOLUS
INTRAVENOUS | Status: AC
  Filled 2013-06-28: qty 20

## 2013-06-28 NOTE — H&P (Signed)
Patient interval history reviewed.  Patient examined again.  There has been no change from documented H/P dated 06/09/13 (scanned into chart from our office) except as documented above.  Assessment:  1.  Abdominal pain, right-sided.  Plan:  1.  Endoscopic ultrasound to assess for chronic pancreatitis, choledocholithiasis. 2.  Risks (bleeding, infection, bowel perforation that could require surgery, sedation-related changes in cardiopulmonary systems), benefits (identification and possible treatment of source of symptoms, exclusion of certain causes of symptoms), and alternatives (watchful waiting, radiographic imaging studies, empiric medical treatment) of upper endoscopy with ultrasound (EUS) were explained to patient/family in detail and patient wishes to proceed.

## 2013-06-28 NOTE — Anesthesia Postprocedure Evaluation (Signed)
  Anesthesia Post-op Note  Patient: Shelly [name]  Procedure(s) Performed: Procedure(s) (LRB): ESOPHAGEAL ENDOSCOPIC ULTRASOUND (EUS) RADIAL (N/A)  Patient Location: PACU  Anesthesia Type: MAC  Level of Consciousness: awake and alert   Airway and Oxygen Therapy: Patient Spontanous Breathing  Post-op Pain: mild  Post-op Assessment: Post-op Vital signs reviewed, Patient's Cardiovascular Status Stable, Respiratory Function Stable, Patent Airway and No signs of Nausea or vomiting  Last Vitals:  Filed Vitals:   06/28/13 1135  BP: 100/56  Pulse: 78  Temp:   Resp: 16    Post-op Vital Signs: stable   Complications: No apparent anesthesia complications

## 2013-06-28 NOTE — Op Note (Signed)
[facility] [place], [address]   ENDOSCOPIC ULTRASOUND PROCEDURE REPORT  PATIENT: Shelly, [name]  MR#: [id number] BIRTHDATE: 06-Feb-1945  GENDER: Female ENDOSCOPIST: Arta Silence, MD REFERRED BY:  Darcus Austin, M.D. PROCEDURE DATE:  06/28/2013 PROCEDURE:   Upper EUS ASA CLASS:      Class II INDICATIONS:   1.  right upper quadrant abdominal pain (cyclical). MEDICATIONS: MAC sedation, administered by CRNA  DESCRIPTION OF PROCEDURE:   After the risks benefits and alternatives of the procedure were  explained, informed consent was obtained. The patient was then placed in the left, lateral, decubitus postion and IV sedation was administered. Throughout the procedure, the patients blood pressure, pulse and oxygen saturations were monitored continuously.  Under direct visualization, the radial forward-viewing echoendoscope was introduced through the mouth  and advanced to the second portion of the duodenum .  Water was used as necessary to provide an acoustic interface.  Upon completion of the imaging, water was removed and the patient was sent to the recovery room in satisfactory condition.      FINDINGS:      Limited mucosal views of esophagus, stomach and duodenum to the second portion were normal.  Via EUS, Normal-appearing pancreas; no pancreatic mass; no pancreatic cyst; no features of chronic pancreatitis.  Normal ampulla via EUS.  Bile duct non-dilated.  No bile duct wall thickening and no choledocholithiasis seen.  Post-cholecystectomy.  Normal limited views of left lobe of liver.  IMPRESSION:     As above.  No explanation for patient's intermittent abdominal pain.  RECOMMENDATIONS:     1.  Watch for potential complications of procedure. 2.  If pain remains sporadic, would consider imaging (xray +/- CT) and labs (CBC, CMP, lipase, urine porphobilinogen) IN MIDST of symptoms. 3.  If pain is more progressive in nature, might  consider trial of antispasmodic. 4.  Follow-up with [facility] in 3 months.   _______________________________ Lorrin MaisArta Silence, MD 06/28/2013 11:08 AM   CC:

## 2013-06-28 NOTE — Transfer of Care (Signed)
Immediate Anesthesia Transfer of Care Note  Patient: Shelly [name]  Procedure(s) Performed: Procedure(s): ESOPHAGEAL ENDOSCOPIC ULTRASOUND (EUS) RADIAL (N/A)  Patient Location: PACU and Endoscopy Unit  Anesthesia Type:MAC  Level of Consciousness: awake, sedated and responds to stimulation  Airway & Oxygen Therapy: Patient Spontanous Breathing and Patient connected to nasal cannula oxygen  Post-op Assessment: Report given to PACU RN and Post -op Vital signs reviewed and stable  Post vital signs: Reviewed and stable  Complications: No apparent anesthesia complications

## 2013-06-28 NOTE — Anesthesia Preprocedure Evaluation (Signed)
Anesthesia Evaluation  Patient identified by MRN, date of birth, ID band Patient awake    Reviewed: Allergy & Precautions, H&P , NPO status , Patient's Chart, lab work & pertinent test results  Airway Mallampati: II TM Distance: >3 FB Neck ROM: Full    Dental no notable dental hx.    Pulmonary Current Smoker,  breath sounds clear to auscultation  Pulmonary exam normal       Cardiovascular negative cardio ROS  Rhythm:Regular Rate:Normal     Neuro/Psych PSYCHIATRIC DISORDERS Anxiety Depression negative neurological ROS  negative psych ROS   GI/Hepatic negative GI ROS, Neg liver ROS,   Endo/Other  negative endocrine ROS  Renal/GU negative Renal ROS  negative genitourinary   Musculoskeletal negative musculoskeletal ROS (+)   Abdominal   Peds negative pediatric ROS (+)  Hematology negative hematology ROS (+)   Anesthesia Other Findings   Reproductive/Obstetrics negative OB ROS                           Anesthesia Physical Anesthesia Plan  ASA: II  Anesthesia Plan: MAC   Post-op Pain Management:    Induction: Intravenous  Airway Management Planned:   Additional Equipment:   Intra-op Plan:   Post-operative Plan:   Informed Consent: I have reviewed the patients History and Physical, chart, labs and discussed the procedure including the risks, benefits and alternatives for the proposed anesthesia with the patient or authorized representative who has indicated his/her understanding and acceptance.   Dental advisory given  Plan Discussed with: CRNA  Anesthesia Plan Comments:         Anesthesia Quick Evaluation

## 2013-06-28 NOTE — Discharge Instructions (Signed)
Endoscopic Ultrasound  Care After Please read the instructions outlined below and refer to this sheet in the next few weeks. These discharge instructions provide you with general information on caring for yourself after you leave the hospital. Your doctor may also give you specific instructions. While your treatment has been planned according to the most current medical practices available, unavoidable complications occasionally occur. If you have any problems or questions after discharge, please call Dr. Paulita Fujita [facility]) at [phone].  HOME CARE INSTRUCTIONS Activity  You may resume your regular activity but move at a slower pace for the next 24 hours.   Take frequent rest periods for the next 24 hours.   Walking will help expel (get rid of) the air and reduce the bloated feeling in your abdomen.   No driving for 24 hours (because of the anesthesia (medicine) used during the test).   You may shower.   Do not sign any important legal documents or operate any machinery for 24 hours (because of the anesthesia used during the test).  Nutrition  Drink plenty of fluids.   You may resume your normal diet.   Begin with a light meal and progress to your normal diet.   Avoid alcoholic beverages for 24 hours or as instructed by your caregiver.  Medications You may resume your normal medications unless your caregiver tells you otherwise. What you can expect today  You may experience abdominal discomfort such as a feeling of fullness or "gas" pains.   You may experience a sore throat for 2 to 3 days. This is normal. Gargling with salt water may help this.    SEEK IMMEDIATE MEDICAL CARE IF:  You have excessive nausea (feeling sick to your stomach) and/or vomiting.   You have severe abdominal pain and distention (swelling).   You have trouble swallowing.   You have a temperature over 100 F (37.8 C).   You have rectal bleeding or vomiting of blood.  Document  Released: 08/06/2003 Document Revised: 09/03/2010 Document Reviewed: 02/16/2007 [facility] Patient Information 2012 [employer].

## 2013-06-29 ENCOUNTER — Encounter (HOSPITAL_COMMUNITY): Payer: Self-pay | Admitting: Gastroenterology

## 2014-05-29 ENCOUNTER — Other Ambulatory Visit: Payer: Self-pay | Admitting: Orthopaedic Surgery

## 2014-05-29 DIAGNOSIS — M79641 Pain in right hand: Secondary | ICD-10-CM

## 2014-06-08 ENCOUNTER — Ambulatory Visit
Admission: RE | Admit: 2014-06-08 | Discharge: 2014-06-08 | Disposition: A | Discharge status: Home / Self Care | Payer: Medicare Other | Source: Ambulatory Visit | Attending: Orthopaedic Surgery | Admitting: Orthopaedic Surgery

## 2014-06-08 DIAGNOSIS — M79641 Pain in right hand: Secondary | ICD-10-CM

## 2014-08-15 ENCOUNTER — Other Ambulatory Visit: Payer: Self-pay | Admitting: Orthopaedic Surgery

## 2014-08-15 DIAGNOSIS — M545 Low back pain: Secondary | ICD-10-CM

## 2014-08-17 ENCOUNTER — Ambulatory Visit
Admission: RE | Admit: 2014-08-17 | Discharge: 2014-08-17 | Disposition: A | Discharge status: Home / Self Care | Payer: Medicare Other | Source: Ambulatory Visit | Attending: Orthopaedic Surgery | Admitting: Orthopaedic Surgery

## 2014-08-17 ENCOUNTER — Other Ambulatory Visit: Payer: Self-pay | Admitting: Orthopaedic Surgery

## 2014-08-17 DIAGNOSIS — M545 Low back pain: Secondary | ICD-10-CM

## 2014-08-20 ENCOUNTER — Other Ambulatory Visit: Payer: Medicare Other

## 2014-08-22 ENCOUNTER — Other Ambulatory Visit: Payer: Medicare Other

## 2014-09-12 ENCOUNTER — Ambulatory Visit: Payer: Medicare Other | Attending: Orthopaedic Surgery | Admitting: Physical Therapy

## 2014-09-12 ENCOUNTER — Encounter: Payer: Self-pay | Admitting: Physical Therapy

## 2014-09-12 DIAGNOSIS — M545 Low back pain, unspecified: Secondary | ICD-10-CM

## 2014-09-12 DIAGNOSIS — M79605 Pain in left leg: Secondary | ICD-10-CM

## 2014-09-12 NOTE — Patient Instructions (Signed)
Sleeping on Side   Place pillow between knees. Use cervical support under neck and a roll around waist as needed.   Copyright  VHI. All rights reserved.  Posture - Sitting   Sit upright, head facing forward. Try using a roll to support lower back. Keep shoulders relaxed, and avoid rounded back. Keep hips level with knees. Avoid crossing legs for long periods.   Copyright  VHI. All rights reserved.  Reading   When reading, hold material in tilted position and maintain good sitting posture.   Copyright  VHI. All rights reserved.  Computer Work   Position work to Programmer, multimedia. Use proper work and seat height. Keep shoulders back and down, wrists straight, and elbows at right angles. Use chair that provides full back support. Add footrest and lumbar roll as needed.   Copyright  VHI. All rights reserved.  Lifting Principles .Maintain proper posture and head alignment. .Slide object as close as possible before lifting. .Move obstacles out of the way. .Test before lifting; ask for help if too heavy. .Tighten stomach muscles without holding breath. .Use smooth movements; do not jerk. .Use legs to do the work, and pivot with feet. .Distribute the work load symmetrically and close to the center of trunk. .Push instead of pull whenever possible.  Copyright  VHI. All rights reserved.  Low Shelf   Squat down, and bring item close to lift.   Copyright  VHI. All rights reserved.  Piriformis Stretch, Sitting   Sit, one ankle on opposite knee, same-side hand on crossed knee. Push down on knee, keeping spine straight. Lean torso forward, with flat back, until tension is felt in hamstrings and gluteals of crossed-leg side. Hold _15__ seconds.  Repeat _2__ times per session. Do _2__ sessions per day.  Copyright  VHI. All rights reserved.  [facility] [address], [place], [place] [address] Phone # [phone] Fax [phone]

## 2014-09-12 NOTE — Therapy (Signed)
[facility] [address] [place], [place], [address] Phone: [phone]   Fax:  [phone]  Physical Therapy Evaluation  Patient Details  Name: Shelly [name] MRN: [id number] Date of Birth: 09-06-1945 Referring Provider:  Garald Balding, MD  Encounter Date: 09/12/2014      PT End of Session - 09/12/14 1522    Visit Number 1   Date for PT Re-Evaluation 11/07/14   PT Start Time 0626   PT Stop Time 1523   PT Time Calculation (min) 38 min   Activity Tolerance Patient tolerated treatment well   Behavior During Therapy [facility] for tasks assessed/performed      Past Medical History  Diagnosis Date  . Allergic rhinitis   . Hypotension   . Arthritis     right hand  . Transient gluten sensitivity     diarrhea and stomaCH pains    Past Surgical History  Procedure Laterality Date  . Gallbladder surgery  2005  . Tubal ligation  1981  . Eus N/A 06/28/2013    Procedure: ESOPHAGEAL ENDOSCOPIC ULTRASOUND (EUS) RADIAL;  Surgeon: Arta Silence, MD;  Location: [facility];  Service: Endoscopy;  Laterality: N/A;    There were no vitals filed for this visit.  Visit Diagnosis:  Lumbar pain with radiation down left leg - Plan: PT plan of care cert/re-cert      Subjective Assessment - 09/12/14 1446    Subjective Patient reports sudden onset of back and leg pain that started 04/2014.  Patient reports it happened after she was painting and taking down wallpaper in her house. Patient had a steroid injection on 08/29/2014.  Scheduled another shot on 08/19/2014.    Pertinent History atient   Limitations Sitting   How long can you sit comfortably? 30 min   How long can you stand comfortably? No difficulty   How long can you walk comfortably? no difficulty   Patient Stated Goals stop hurting; increase back strength; return to prior functional level   Currently in Pain? Yes   Pain Score 1   10/10 in the AM   Pain Location Back   left leg   Pain Orientation Left   Pain Descriptors / Indicators Aching  left leg numbness   Pain Type Acute pain   Pain Onset More than a month ago   Pain Frequency Intermittent   Aggravating Factors  sitting and riding in a car   Pain Relieving Factors sit straight up   Effect of Pain on Daily Activities unable to do alot of stuff- gardening, cleaning, mopping floor, washing dishes, feed cats, bending over   Multiple Pain Sites No            [facility] PT Assessment - 09/12/14 0001    Assessment   Medical Diagnosis Facet OA L4-5, L5 nerve root irritation   Onset Date/Surgical Date 04/06/14   Prior Therapy None   Precautions   Precautions None   Balance Screen   Has the patient fallen in the past 6 months No   Has the patient had a decrease in activity level because of a fear of falling?  No   Is the patient reluctant to leave their home because of a fear of falling?  No   Prior Function   Level of Independence Needs assistance with ADLs;Needs assistance with homemaking   Leisure pain to sit and read, computer   Comments trouble gardening, riding bike   Cognition   Overall Cognitive Status Within  Functional Limits for tasks assessed   Observation/Other Assessments   Focus on Therapeutic Outcomes (FOTO)  55% limitation  goal 45% limitation   ROM / Strength   AROM / PROM / Strength AROM;Strength   AROM   AROM Assessment Site Lumbar   Lumbar Flexion 100% due to bending at hips   Lumbar Extension decreased by 25%   Lumbar - Right Side Bend full   Lumbar - Left Side Bend full   Strength   Overall Strength Comments bil. hip strength is 4/5.    Palpation   Spinal mobility L4-L5 decreased movement   SI assessment  left posteriorly rotated; sacrum rotated right   Palpation comment neural tenstion test on left LE positive; tenderness located around left greater trochanter   Special Tests    Special Tests Lumbar   Hip Special Tests  Trendelenberg Test   Straight Leg Raise    Findings Negative   Side  Left   Comment pain at 65 degree   Trendelenburg Test   Findings Positive   Side Right;Left                           PT Education - 09/12/14 1521    Education provided Yes   Education Details body mechanics with sitting, sleeping, lifting principles, piriformis stretch   Person(s) Educated Patient   Methods Explanation;Demonstration;Tactile cues;Verbal cues;Handout   Comprehension Returned demonstration;Verbalized understanding          PT Short Term Goals - 09/12/14 1527    PT SHORT TERM GOAL #1   Title independent with initial HEP   Time 4   Period Weeks   Status New   PT SHORT TERM GOAL #2   Title ability to return demonstration with correct body mechanics with daily tasks   Time 4   Period Weeks   Status New   PT SHORT TERM GOAL #3   Title pain with daily tasks decreased >/= 25%   Time 4   Period Weeks   Status New           PT Long Term Goals - 09/12/14 1528    PT LONG TERM GOAL #1   Title independent with HEP   Time 8   Period Weeks   Status New   PT LONG TERM GOAL #2   Title sit for 1- 2 hours with pain decreased >/= 75%   Time 8   Period Weeks   Status New   PT LONG TERM GOAL #3   Title perform daily tasks with pain decreased >/= 75%   Time 8   Period Weeks   Status New   PT LONG TERM GOAL #4   Title sleep without difficulty due to pain decreased   Time 8   Period Weeks   Status New   PT LONG TERM GOAL #5   Title sit in recliner with her cats with minimal difficulty   Time 8   Period Weeks   Status New               Plan - 09/12/14 1522    Clinical Impression Statement Patient is a 69 year old female with diagnosis of facet OA L4-5, L5 nerve root irritation with sudden onset on 04/06/2014.  Patient has had 1 steroid injection and scheduled for another next week.  FOTO score is 55% limitaiton.  Lumbar extension decreased by 25% and flexion decreased by 75% due to most movement at  hips.   Left ilium is rotated posteriorly and sacrum rotated right.  Decreased movement of L4-5.  Palpable tenderness located in left posterior greater trochanter.  Positive neural tension test in left LE.  Trendelenberg bil. Patient will benefit from physical therapy to improeve lumbar ROM and reduce pain.    Pt will benefit from skilled therapeutic intervention in order to improve on the following deficits Decreased activity tolerance;Decreased mobility;Decreased strength;Improper body mechanics;Pain;Increased muscle spasms;Decreased endurance;Decreased range of motion;Increased fascial restricitons   Rehab Potential Excellent   Clinical Impairments Affecting Rehab Potential None   PT Frequency 2x / week   PT Duration 8 weeks   PT Next Visit Plan modalities as needed, correct ilium, joint mobilization to lumbar and sacrum, soft tissue work to left greater trochanter, neural tension stretch to left LE, body mechanics, core stabilization   PT Home Exercise Plan Core stabilization   Recommended Other Services None   Consulted and Agree with Plan of Care Patient          G-Codes - 2014/09/27 1442    Functional Assessment Tool Used FOTO score is 55% limitaiton   Functional Limitation Other PT primary   Other PT Primary Current Status (B2010) At least 40 percent but less than 60 percent impaired, limited or restricted   Other PT Primary Goal Status (O7121) At least 40 percent but less than 60 percent impaired, limited or restricted       Problem List Patient Active Problem List   Diagnosis Date Noted  . Pulmonary nodules 08/31/2011  . Hemoptysis, unspecified 08/31/2011  . ANXIETY 02/13/2010  . DEPRESSION 02/13/2010  . DIARRHEA 02/13/2010  . ABDOMINAL PAIN-PERIUMBILICAL 97/58/8325    Rece Zechman,PT 2014/09/27, 3:31 PM  [facility] [address] [place], [place], [address] Phone: [phone]   Fax:  [phone]

## 2014-09-18 ENCOUNTER — Encounter: Payer: Self-pay | Admitting: Physical Therapy

## 2014-09-18 ENCOUNTER — Ambulatory Visit: Payer: Medicare Other | Admitting: Physical Therapy

## 2014-09-18 DIAGNOSIS — M545 Low back pain, unspecified: Secondary | ICD-10-CM

## 2014-09-18 DIAGNOSIS — M79605 Pain in left leg: Secondary | ICD-10-CM

## 2014-09-18 NOTE — Therapy (Signed)
[facility] [address] [place], [place], [address] Phone: [phone]   Fax:  [phone]  Physical Therapy Treatment  Patient Details  Name: Shelly [name] MRN: [id number] Date of Birth: [birth year] Referring Provider:  Garald Balding, MD  Encounter Date: 09/18/2014      PT End of Session - 09/18/14 1708    Visit Number 2   Number of Visits 10   Date for PT Re-Evaluation 11/07/14   PT Start Time 3154   PT Stop Time 1615   PT Time Calculation (min) 45 min   Activity Tolerance Patient tolerated treatment well   Behavior During Therapy [facility] for tasks assessed/performed      Past Medical History  Diagnosis Date  . Allergic rhinitis   . Hypotension   . Arthritis     right hand  . Transient gluten sensitivity     diarrhea and stomaCH pains    Past Surgical History  Procedure Laterality Date  . Gallbladder surgery  2005  . Tubal ligation  1981  . Eus N/A 06/28/2013    Procedure: ESOPHAGEAL ENDOSCOPIC ULTRASOUND (EUS) RADIAL;  Surgeon: Shelly Silence, MD;  Location: [facility];  Service: Endoscopy;  Laterality: N/A;    There were no vitals filed for this visit.  Visit Diagnosis:  Lumbar pain with radiation down left leg      Subjective Assessment - 09/18/14 1550    Subjective Patient reports sudden onset of back and leg pain that started 04/2014.  Patient reports it started after she was painting and taking down wallpaper in her house. Patient had a steroid injection on 08/29/2014 and is scheduled to have another injection tomorrow Sept 14th. .    Currently in Pain? Yes   Pain Score 7    Pain Location Back   Pain Orientation Left;Other (Comment)  buttocks area   Pain Descriptors / Indicators Aching   Pain Type Acute pain   Pain Onset More than a month ago   Pain Frequency Intermittent   Multiple Pain Sites No                         [facility] Adult PT Treatment/Exercise -  09/18/14 0001    Exercises   Exercises Lumbar;Knee/Hip   Lumbar Exercises: Stretches   Pelvic Tilt 5 reps  x 2    Piriformis Stretch 3 reps;20 seconds  each leg    Lumbar Exercises: Supine   Other Supine Lumbar Exercises SKC, BKC, trunkrotation, x 3 with 20 sec hold each while on Heating pack    Knee/Hip Exercises: Stretches   Active Hamstring Stretch Both;3 reps;20 seconds  using green strap   Knee/Hip Exercises: Aerobic   Nustep level 1 x 4 min                 PT Education - 09/18/14 1707    Education provided Yes   Education Details pelvic tilt, SKC, DKC, trunk rotation, hamstring stretch   Person(s) Educated Patient   Methods Explanation;Demonstration;Tactile cues;Handout   Comprehension Returned demonstration;Verbalized understanding          PT Short Term Goals - 09/18/14 1714    PT SHORT TERM GOAL #1   Title independent with initial HEP   Time 4   Period Weeks   Status On-going   PT SHORT TERM GOAL #2   Title ability to return demonstration with correct body mechanics with daily tasks   Time 4  Period Weeks   Status On-going   PT SHORT TERM GOAL #3   Title pain with daily tasks decreased >/= 25%   Time 4   Period Weeks   Status On-going           PT Long Term Goals - 09/18/14 1714    PT LONG TERM GOAL #1   Title independent with HEP   Time 8   Period Weeks   Status On-going   PT LONG TERM GOAL #2   Title sit for 1- 2 hours with pain decreased >/= 75%   Time 8   Period Weeks   Status On-going   PT LONG TERM GOAL #3   Title perform daily tasks with pain decreased >/= 75%   Time 8   Period Weeks   Status On-going   PT LONG TERM GOAL #4   Title sleep without difficulty due to pain decreased   Time 8   Period Weeks   Status On-going   PT LONG TERM GOAL #5   Title sit in recliner with her cats with minimal difficulty   Time 8   Period Weeks   Status On-going               Plan - 09/18/14 1709    Clinical Impression  Statement Patient is a 69 y o female with diagnois of facet OA l4-L5, L5 nerve root irritation with sudden onset on 04/06/2014. Pt arrived with pain 7/10 and after PT pain down to 5/10 in left buttocks area. Pt will continiue to benefit from skilled PT   Rehab Potential Excellent   PT Frequency 2x / week   PT Duration 8 weeks   PT Next Visit Plan review HE basic back, modalities as needed, correct ilium, joint mobilization to lumbar and sacrum, soft tissue work to left greater trochanter,  core stabilization   PT Home Exercise Plan basic back pelvic tilt, SKC, BKC, trunk rotation   Consulted and Agree with Plan of Care Patient        Problem List Patient Active Problem List   Diagnosis Date Noted  . Pulmonary nodules 08/31/2011  . Hemoptysis, unspecified 08/31/2011  . ANXIETY 02/13/2010  . DEPRESSION 02/13/2010  . DIARRHEA 02/13/2010  . ABDOMINAL PAIN-PERIUMBILICAL 92/11/9415    [name],Shelly [name] PTA 09/18/2014, 5:19 PM  [facility] [address], [place], [place], [address] Phone: [phone]   Fax:  [phone]

## 2014-09-18 NOTE — Patient Instructions (Signed)
Bridge   Lie back, legs bent. Inhale, pressing hips up. Keeping ribs in, lengthen lower back. Exhale, rolling down along spine from top. Repeat __ 10 times times. Do _2-3  ___ sessions per day.  Copyright  VHI. All rights reserved.   Pelvic Tilt   Flatten back by tightening stomach muscles and buttocks. Repeat  10 ____ times per set. Do 2-3 ____ sets per session. Do many ____ sessions per day.  http://orth.exer.us/134   Copyright  VHI. All rights reserved. Knee to Chest (Flexion)   Pull knee toward chest. Feel stretch in lower back or buttock area. Breathing deeply, Hold 20  seconds. Repeat with other knee. Repeat __3__ times each leg.. Do _2-3___ sessions per day.    PERFORM also with Both legs   X 3 with 20 sec hold each  http://gt2.exer.us/225   Copyright  VHI. All rights reserved.   Lower Trunk Rotation Stretch   Keeping back flat and feet together, rotate knees to left side. Hold 20  Seconds. Turn your head to opposite side  Repeat  3____ times each side. Do 1 sets per session. Do 2-3 sessions per day.  http://orth.exer.us/122   Copyright  VHI. All rights reserved.  Supine: Leg Stretch With Strap (Basic)   Lie on back with one knee bent, foot flat on floor. Hook strap around other foot. Straighten knee. . Hold 20 seconds. Relax leg completely down to floor.  Repeat _3 times per session. Do 2-3  sessions per day.

## 2014-09-27 ENCOUNTER — Ambulatory Visit: Payer: Medicare Other | Admitting: Physical Therapy

## 2014-09-27 ENCOUNTER — Encounter: Payer: Self-pay | Admitting: Physical Therapy

## 2014-09-27 DIAGNOSIS — M545 Low back pain, unspecified: Secondary | ICD-10-CM

## 2014-09-27 DIAGNOSIS — M79605 Pain in left leg: Secondary | ICD-10-CM

## 2014-09-27 NOTE — Therapy (Signed)
[facility] [address], [place], [place], [address] Phone: [phone]   Fax:  [phone]  Physical Therapy Treatment  Patient Details  Name: Shelly [name] MRN: [id number] Date of Birth: April 23, 1945 Referring Provider:  Garald Balding, MD  Encounter Date: 09/27/2014      PT End of Session - 09/27/14 1404    Visit Number 3   Number of Visits 10  Medicare   Date for PT Re-Evaluation 11/07/14   PT Start Time 1400   PT Stop Time 1440   PT Time Calculation (min) 40 min   Activity Tolerance Patient tolerated treatment well   Behavior During Therapy [facility] for tasks assessed/performed      Past Medical History  Diagnosis Date  . Allergic rhinitis   . Hypotension   . Arthritis     right hand  . Transient gluten sensitivity     diarrhea and stomaCH pains    Past Surgical History  Procedure Laterality Date  . Gallbladder surgery  2005  . Tubal ligation  1981  . Eus N/A 06/28/2013    Procedure: ESOPHAGEAL ENDOSCOPIC ULTRASOUND (EUS) RADIAL;  Surgeon: Arta Silence, MD;  Location: [facility];  Service: Endoscopy;  Laterality: N/A;    There were no vitals filed for this visit.  Visit Diagnosis:  Lumbar pain with radiation down left leg      Subjective Assessment - 09/27/14 1406    Subjective I had a steroid shot on Monday.  I feel 50% better.    Limitations Sitting   How long can you sit comfortably? 30 min   How long can you stand comfortably? No difficulty   How long can you walk comfortably? no difficulty   Patient Stated Goals stop hurting; increase back strength; return to prior functional level   Currently in Pain? Yes   Pain Score 5    Pain Location Hip  leg   Pain Orientation Left   Pain Descriptors / Indicators Aching   Pain Type Acute pain   Pain Radiating Towards down left leg   Pain Onset More than a month ago   Pain Frequency Intermittent   Aggravating Factors  sit in a slumped  position, riding in a car   Pain Relieving Factors sit up straight   Multiple Pain Sites No                         [facility] Adult PT Treatment/Exercise - 09/27/14 0001    Lumbar Exercises: Stretches   Single Knee to Chest Stretch 3 reps;10 seconds  left   Piriformis Stretch 3 reps;20 seconds  each leg    Lumbar Exercises: Quadruped   Madcat/Old Horse 5 reps  but stopped due to radiating pain into left gluteal   Knee/Hip Exercises: Aerobic   Nustep level 1 x 4 min   Manual Therapy   Manual Therapy Muscle Energy Technique;Soft tissue mobilization;Joint mobilization   Soft tissue mobilization left piriformis, left gluteal, left quadratus, left psoas   Muscle Energy Technique to correct left post. rotated ilium                PT Education - 09/27/14 1439    Education provided Yes   Education Details SKC, piriformis stretch, trunk sidebending,    Person(s) Educated Patient   Methods Explanation;Demonstration;Verbal cues;Handout   Comprehension Returned demonstration;Verbalized understanding          PT Short Term Goals - 09/18/14 1714  PT SHORT TERM GOAL #1   Title independent with initial HEP   Time 4   Period Weeks   Status On-going   PT SHORT TERM GOAL #2   Title ability to return demonstration with correct body mechanics with daily tasks   Time 4   Period Weeks   Status On-going   PT SHORT TERM GOAL #3   Title pain with daily tasks decreased >/= 25%   Time 4   Period Weeks   Status On-going           PT Long Term Goals - 09/18/14 1714    PT LONG TERM GOAL #1   Title independent with HEP   Time 8   Period Weeks   Status On-going   PT LONG TERM GOAL #2   Title sit for 1- 2 hours with pain decreased >/= 75%   Time 8   Period Weeks   Status On-going   PT LONG TERM GOAL #3   Title perform daily tasks with pain decreased >/= 75%   Time 8   Period Weeks   Status On-going   PT LONG TERM GOAL #4   Title sleep without difficulty  due to pain decreased   Time 8   Period Weeks   Status On-going   PT LONG TERM GOAL #5   Title sit in recliner with her cats with minimal difficulty   Time 8   Period Weeks   Status On-going               Plan - 09/27/14 1441    Clinical Impression Statement Patient is a 69 year old female with diagnosis of facet OA K4-5 nerve root irritation.  Patient had left rotated ilium but after therapy was corrected.  Patient had trigger points in left piriformis, left quadratus and psoas.  Paitent is not abel to perfrom activities with trunk flexion due to increase pain.  Patient will benefit from physical therapy to reduce pain and increase mobility.    Pt will benefit from skilled therapeutic intervention in order to improve on the following deficits Decreased activity tolerance;Decreased mobility;Decreased strength;Improper body mechanics;Pain;Increased muscle spasms;Decreased endurance;Decreased range of motion;Increased fascial restricitons   Rehab Potential Excellent   Clinical Impairments Affecting Rehab Potential None   PT Frequency 2x / week   PT Duration 8 weeks   PT Treatment/Interventions Taping;Manual techniques;Patient/family education;Neuromuscular re-education;Traction;Ultrasound;Therapeutic activities;Therapeutic exercise;Moist Heat;Electrical Stimulation;Cryotherapy;ADLs/Self Care Home Management   PT Next Visit Plan soft tissue work, Economist, core stabilization   PT Home Exercise Plan body mechanics   Consulted and Agree with Plan of Care Patient        Problem List Patient Active Problem List   Diagnosis Date Noted  . Pulmonary nodules 08/31/2011  . Hemoptysis, unspecified 08/31/2011  . ANXIETY 02/13/2010  . DEPRESSION 02/13/2010  . DIARRHEA 02/13/2010  . ABDOMINAL PAIN-PERIUMBILICAL 81/85/6314    Fayette Hamada,PT 09/27/2014, 2:45 PM  [facility] [address] [place], [place],  [address] Phone: [phone]   Fax:  [phone]

## 2014-09-27 NOTE — Patient Instructions (Signed)
Piriformis stretch laying on back with left leg on right knee, push left knee downward.  Hold 30 sec 3 times.  Knee-to-Chest Stretch: Unilateral   With hand behind right knee, pull knee in to chest until a comfortable stretch is felt in lower back and buttocks. Keep back relaxed. Hold _30___ seconds. Repeat __2__ times per set. Do __1__ sets per session. Do __1__ sessions per day.  http://orth.exer.us/126   Copyright  VHI. All rights reserved.  Thoracolumbar Side-Bend: Double Arm (Standing)   Hands clasped, reach over head to left side until stretch is felt. Hold __5__ seconds. Relax. Repeat _2___ times per set. Do __1__ sets per session. Do _1___ sessions per day.  http://orth.exer.us/264   Copyright  VHI. All rights reserved.  [place] [address] [place], [place] [address] Phone # [phone] Fax [phone]

## 2014-10-02 ENCOUNTER — Encounter: Payer: Self-pay | Admitting: Physical Therapy

## 2014-10-02 ENCOUNTER — Ambulatory Visit: Payer: Medicare Other | Admitting: Physical Therapy

## 2014-10-02 DIAGNOSIS — M545 Low back pain, unspecified: Secondary | ICD-10-CM

## 2014-10-02 DIAGNOSIS — M79605 Pain in left leg: Secondary | ICD-10-CM

## 2014-10-02 NOTE — Patient Instructions (Signed)

## 2014-10-02 NOTE — Therapy (Signed)
[facility] [address], [place], [place], [address] Phone: [phone]   Fax:  [phone]  Physical Therapy Treatment  Patient Details  Name: Shelly [name] MRN: [id number] Date of Birth: 1945/11/26 Referring Provider:  Garald Balding, MD  Encounter Date: 10/02/2014      PT End of Session - 10/02/14 1546    Visit Number 4   Number of Visits 10   Date for PT Re-Evaluation 11/07/14   PT Start Time 5681   PT Stop Time 1615   PT Time Calculation (min) 45 min   Activity Tolerance Patient tolerated treatment well   Behavior During Therapy [facility] for tasks assessed/performed      Past Medical History  Diagnosis Date  . Allergic rhinitis   . Hypotension   . Arthritis     right hand  . Transient gluten sensitivity     diarrhea and stomaCH pains    Past Surgical History  Procedure Laterality Date  . Gallbladder surgery  2005  . Tubal ligation  1981  . Eus N/A 06/28/2013    Procedure: ESOPHAGEAL ENDOSCOPIC ULTRASOUND (EUS) RADIAL;  Surgeon: Arta Silence, MD;  Location: [facility];  Service: Endoscopy;  Laterality: N/A;    There were no vitals filed for this visit.  Visit Diagnosis:  Lumbar pain with radiation down left leg      Subjective Assessment - 10/02/14 1537    Subjective Pt reports pain in right buttocks area rated as 6/10, the back and left leg feels better since steroid shot   Currently in Pain? Yes   Pain Score 6    Pain Location Hip   Pain Orientation Left   Pain Descriptors / Indicators Aching   Pain Type Acute pain   Pain Onset More than a month ago   Pain Frequency Intermittent   Multiple Pain Sites No                         [facility] Adult PT Treatment/Exercise - 10/02/14 0001    Exercises   Exercises Lumbar;Knee/Hip   Lumbar Exercises: Stretches   Single Knee to Chest Stretch 3 reps;10 seconds  diagonal left only   Piriformis Stretch 3 reps;20 seconds  each  leg   Lumbar Exercises: Quadruped   Madcat/Old Horse 5 reps  x 3, pt tolerated better today   Knee/Hip Exercises: Aerobic   Nustep level 1 x 6 min  seat#5, arms #7   Manual Therapy   Manual Therapy Soft tissue mobilization;Myofascial release  to left gluteal area, with PROM    Soft tissue mobilization left piriformis positioning, left gluteal area, hamstrings with PROM for elongation                PT Education - 10/02/14 1557    Education provided Yes   Education Details TA activation, with leg movements ER, slides, marching   Person(s) Educated Patient   Methods Explanation;Handout   Comprehension Returned demonstration;Verbal cues required          PT Short Term Goals - 10/02/14 1553    PT SHORT TERM GOAL #1   Title independent with initial HEP   Time 4   Period Weeks   Status On-going   PT SHORT TERM GOAL #2   Title ability to return demonstration with correct body mechanics with daily tasks   Time 4   Period Weeks   Status On-going   PT SHORT TERM GOAL #3  Title pain with daily tasks decreased >/= 25%   Time 4   Period Weeks   Status On-going           PT Long Term Goals - 10/02/14 1553    PT LONG TERM GOAL #1   Title independent with HEP   Time 8   Period Weeks   Status On-going   PT LONG TERM GOAL #2   Title sit for 1- 2 hours with pain decreased >/= 75%   Time 8   Period Weeks   Status On-going   PT LONG TERM GOAL #3   Title perform daily tasks with pain decreased >/= 75%   Time 8   Period Weeks   Status On-going   PT LONG TERM GOAL #4   Title sleep without difficulty due to pain decreased   Time 8   Status On-going   PT LONG TERM GOAL #5   Title sit in recliner with her cats with minimal difficulty   Time 8   Period Weeks   Status On-going               Plan - 10/02/14 1546    Clinical Impression Statement Patient is a 69 year old female with diagnosis of facet OA L4-5 nerve root irritation. Pt with palpaple  tenderness left piriformis, lumbar paraspinals, QL and iliopsoas. Pt will conitnue to benefit from skilled Pt to reduce pain and increase mobility and strength.   Pt will benefit from skilled therapeutic intervention in order to improve on the following deficits Decreased activity tolerance;Decreased mobility;Decreased strength;Improper body mechanics;Pain;Increased muscle spasms;Decreased endurance;Decreased range of motion;Increased fascial restricitons   Rehab Potential Excellent   PT Frequency 2x / week   PT Duration 8 weeks   PT Next Visit Plan soft tissue work, Economist, core stabilization   PT Home Exercise Plan body mechanics   Consulted and Agree with Plan of Care Patient        Problem List Patient Active Problem List   Diagnosis Date Noted  . Pulmonary nodules 08/31/2011  . Hemoptysis, unspecified 08/31/2011  . ANXIETY 02/13/2010  . DEPRESSION 02/13/2010  . DIARRHEA 02/13/2010  . ABDOMINAL PAIN-PERIUMBILICAL 58/09/9831    [name],Akaisha Truman PTA 10/02/2014, 4:22 PM  [facility] [address] [place], [place], [address] Phone: [phone]   Fax:  [phone]

## 2014-10-04 ENCOUNTER — Encounter: Payer: Self-pay | Admitting: Physical Therapy

## 2014-10-04 ENCOUNTER — Ambulatory Visit: Payer: Medicare Other | Admitting: Physical Therapy

## 2014-10-04 DIAGNOSIS — M545 Low back pain, unspecified: Secondary | ICD-10-CM

## 2014-10-04 DIAGNOSIS — M79605 Pain in left leg: Secondary | ICD-10-CM

## 2014-10-04 NOTE — Therapy (Signed)
[facility] [address] [place], [place], [address] Phone: [phone]   Fax:  [phone]  Physical Therapy Treatment  Patient Details  Name: Shelly [name] MRN: [id number] Date of Birth: 05/08/1945 Referring Provider:  Garald Balding, MD  Encounter Date: 10/04/2014      PT End of Session - 10/04/14 1617    Visit Number 5   Number of Visits 10   Date for PT Re-Evaluation 11/07/14   PT Start Time 1532   PT Stop Time 1614   PT Time Calculation (min) 42 min   Activity Tolerance Patient tolerated treatment well   Behavior During Therapy [facility] for tasks assessed/performed      Past Medical History  Diagnosis Date  . Allergic rhinitis   . Hypotension   . Arthritis     right hand  . Transient gluten sensitivity     diarrhea and stomaCH pains    Past Surgical History  Procedure Laterality Date  . Gallbladder surgery  2005  . Tubal ligation  1981  . Eus N/A 06/28/2013    Procedure: ESOPHAGEAL ENDOSCOPIC ULTRASOUND (EUS) RADIAL;  Surgeon: Arta Silence, MD;  Location: [facility];  Service: Endoscopy;  Laterality: N/A;    There were no vitals filed for this visit.  Visit Diagnosis:  Lumbar pain with radiation down left leg      Subjective Assessment - 10/04/14 1539    Subjective Pt reports pain in Rt buttocks area rated as 4/10, the back and the left leg feels better.   Currently in Pain? Yes   Pain Score 4    Pain Location Buttocks   Pain Orientation Left   Pain Descriptors / Indicators Sharp;Numbness   Pain Type Acute pain   Pain Onset More than a month ago   Pain Frequency Intermittent   Multiple Pain Sites No                         [facility] Adult PT Treatment/Exercise - 10/04/14 0001    Bed Mobility   Bed Mobility --  Educated pt on fascial release with foam roll and tennisball   Exercises   Exercises Lumbar;Knee/Hip   Lumbar Exercises: Stretches   Single Knee to Chest  Stretch 3 reps;10 seconds  diagonal left only   Piriformis Stretch 3 reps;20 seconds  each leg   Lumbar Exercises: Quadruped   Madcat/Old Horse 5 reps   Knee/Hip Exercises: Aerobic   Nustep level 1 x 8 min   Knee/Hip Exercises: Standing   Other Standing Knee Exercises tennis ball release to left gluteus area    Knee/Hip Exercises: Supine   Other Supine Knee/Hip Exercises Foam roll  to release gluteus left    Manual Therapy   Manual Therapy Soft tissue mobilization;Myofascial release   Soft tissue mobilization left piriformis positioning, left gluteal area, hamstrings with PROM for elongation                  PT Short Term Goals - 10/02/14 1553    PT SHORT TERM GOAL #1   Title independent with initial HEP   Time 4   Period Weeks   Status On-going   PT SHORT TERM GOAL #2   Title ability to return demonstration with correct body mechanics with daily tasks   Time 4   Period Weeks   Status On-going   PT SHORT TERM GOAL #3   Title pain with daily tasks decreased >/=  25%   Time 4   Period Weeks   Status On-going           PT Long Term Goals - 10/02/14 1553    PT LONG TERM GOAL #1   Title independent with HEP   Time 8   Period Weeks   Status On-going   PT LONG TERM GOAL #2   Title sit for 1- 2 hours with pain decreased >/= 75%   Time 8   Period Weeks   Status On-going   PT LONG TERM GOAL #3   Title perform daily tasks with pain decreased >/= 75%   Time 8   Period Weeks   Status On-going   PT LONG TERM GOAL #4   Title sleep without difficulty due to pain decreased   Time 8   Status On-going   PT LONG TERM GOAL #5   Title sit in recliner with her cats with minimal difficulty   Time 8   Period Weeks   Status On-going               Plan - 10/04/14 1618    Clinical Impression Statement Pt is a 68 y.o. female with diagnosis of facet OA L4-5 nerve irritation. Pt with palpaple tenderness along left sacral border and piriformis, lumbar paraspinals  and Lt QL and iliopsoas. Pt will continue to benefit from skilled PT to reduce pain and to incre mobility and strength   Pt will benefit from skilled therapeutic intervention in order to improve on the following deficits Decreased activity tolerance;Decreased mobility;Decreased strength;Improper body mechanics;Pain;Increased muscle spasms;Decreased endurance;Decreased range of motion;Increased fascial restricitons   Rehab Potential Excellent   Clinical Impairments Affecting Rehab Potential None   PT Frequency 2x / week   PT Duration 8 weeks   PT Treatment/Interventions Taping;Manual techniques;Patient/family education;Neuromuscular re-education;Traction;Ultrasound;Therapeutic activities;Therapeutic exercise;Moist Heat;Electrical Stimulation;Cryotherapy;ADLs/Self Care Home Management   PT Next Visit Plan Review exercises with faom roll and ball to release left buttocks area   PT Home Exercise Plan body mechanics   Consulted and Agree with Plan of Care Patient        Problem List Patient Active Problem List   Diagnosis Date Noted  . Pulmonary nodules 08/31/2011  . Hemoptysis, unspecified 08/31/2011  . ANXIETY 02/13/2010  . DEPRESSION 02/13/2010  . DIARRHEA 02/13/2010  . ABDOMINAL PAIN-PERIUMBILICAL 81/27/5170    [name],Danialle Dement PTA 10/04/2014, 5:26 PM  [facility] [address] [place], [place], [address] Phone: [phone]   Fax:  [phone]

## 2014-10-09 ENCOUNTER — Ambulatory Visit: Payer: Medicare Other | Attending: Orthopaedic Surgery | Admitting: Physical Therapy

## 2014-10-09 ENCOUNTER — Encounter: Payer: Self-pay | Admitting: Physical Therapy

## 2014-10-09 DIAGNOSIS — M545 Low back pain, unspecified: Secondary | ICD-10-CM

## 2014-10-09 DIAGNOSIS — M79605 Pain in left leg: Secondary | ICD-10-CM

## 2014-10-09 NOTE — Therapy (Signed)
[facility] [address] [place], [place], [address] Phone: [phone]   Fax:  [phone]  Physical Therapy Treatment  Patient Details  Name: Shelly [name] MRN: [id number] Date of Birth: [birth year] Referring Provider:  Garald Balding, MD  Encounter Date: 10/09/2014      PT End of Session - 10/09/14 1447    Visit Number 6   Number of Visits 10  Medicare   Date for PT Re-Evaluation 11/07/14   PT Start Time 9381   PT Stop Time 1525   PT Time Calculation (min) 40 min   Activity Tolerance Patient tolerated treatment well   Behavior During Therapy [facility] for tasks assessed/performed      Past Medical History  Diagnosis Date  . Allergic rhinitis   . Hypotension   . Arthritis     right hand  . Transient gluten sensitivity     diarrhea and stomaCH pains    Past Surgical History  Procedure Laterality Date  . Gallbladder surgery  2005  . Tubal ligation  1981  . Eus N/A 06/28/2013    Procedure: ESOPHAGEAL ENDOSCOPIC ULTRASOUND (EUS) RADIAL;  Surgeon: Arta Silence, MD;  Location: [facility];  Service: Endoscopy;  Laterality: N/A;    There were no vitals filed for this visit.  Visit Diagnosis:  Lumbar pain with radiation down left leg      Subjective Assessment - 10/09/14 1447    Subjective Patient reports the left buttocks is feeling better.  I bought a foam roll to work the muscle out.  The foam roll is helping.    How long can you sit comfortably? unable to sit in a soft chair, sit in a hard chair for as long as she wants   How long can you stand comfortably? No difficulty   How long can you walk comfortably? no difficulty   Patient Stated Goals stop hurting; increase back strength; return to prior functional level   Currently in Pain? Yes   Pain Score 5    Pain Location Buttocks   Pain Orientation Left   Pain Descriptors / Indicators Sharp;Numbness   Pain Type Acute pain   Pain Radiating  Towards down left leg   Pain Onset More than a month ago   Pain Frequency Intermittent   Aggravating Factors  sitting in soft chair, mornings are worse, riding in a car   Pain Relieving Factors sit up straight on hard chair   Effect of Pain on Daily Activities unable to do gardening   Multiple Pain Sites No                         [facility] Adult PT Treatment/Exercise - 10/09/14 0001    Lumbar Exercises: Standing   Wall Slides 10 reps;5 seconds  with green ball between back and wall   Lumbar Exercises: Supine   Other Supine Lumbar Exercises lay supine on foam roll for decompression 1 min; alternate shoulder flexion 15 x; horizontal abduction 15x; heel raises 15x, alternate hip flexion 10x   Lumbar Exercises: Quadruped   Madcat/Old Horse 5 reps  VC to stay in painfree range   Knee/Hip Exercises: Aerobic   Nustep level 1 x 8 min   Knee/Hip Exercises: Supine   Other Supine Knee/Hip Exercises Foam roll  to release gluteus left    Manual Therapy   Manual Therapy Soft tissue mobilization;Myofascial release   Soft tissue mobilization left piriformis , left gluteal and  left qudaratus                PT Education - 10/09/14 1520    Education provided Yes   Education Details wall squats, foam roll exercises   Person(s) Educated Patient   Methods Explanation;Demonstration;Verbal cues;Handout   Comprehension Returned demonstration;Verbalized understanding          PT Short Term Goals - 10/02/14 1553    PT SHORT TERM GOAL #1   Title independent with initial HEP   Time 4   Period Weeks   Status On-going   PT SHORT TERM GOAL #2   Title ability to return demonstration with correct body mechanics with daily tasks   Time 4   Period Weeks   Status On-going   PT SHORT TERM GOAL #3   Title pain with daily tasks decreased >/= 25%   Time 4   Period Weeks   Status On-going           PT Long Term Goals - 10/09/14 1526    PT LONG TERM GOAL #1   Title  independent with HEP   Time 8   Period Weeks   Status On-going  still learning exercises   PT LONG TERM GOAL #2   Title sit for 1- 2 hours with pain decreased >/= 75%   Time 8   Period Weeks   Status On-going   PT LONG TERM GOAL #3   Title perform daily tasks with pain decreased >/= 75%   Time 8   Period Weeks   Status On-going   PT LONG TERM GOAL #4   Title sleep without difficulty due to pain decreased   Time 8   Period Weeks   Status On-going   PT LONG TERM GOAL #5   Title sit in recliner with her cats with minimal difficulty   Time 8   Period Weeks   Status On-going               Plan - 10/09/14 1521    Clinical Impression Statement Patient is a 69 year old female with diagnosis of facet OA L4-5 nerve irritation. Patient has tightness located in left gluteal and quadratus.  Patient able to start squatting with back against green physioball. Patient will benefit from physical therapy to improve core strength, restore her to bending for her gardening, and decrease her pain.    Pt will benefit from skilled therapeutic intervention in order to improve on the following deficits Decreased activity tolerance;Decreased mobility;Decreased strength;Improper body mechanics;Pain;Increased muscle spasms;Decreased endurance;Decreased range of motion;Increased fascial restricitons   Rehab Potential Excellent   Clinical Impairments Affecting Rehab Potential None   PT Frequency 2x / week   PT Duration 8 weeks   PT Treatment/Interventions Taping;Manual techniques;Patient/family education;Neuromuscular re-education;Traction;Ultrasound;Therapeutic activities;Therapeutic exercise;Moist Heat;Electrical Stimulation;Cryotherapy;ADLs/Self Care Home Management   PT Next Visit Plan See patient 1 time per week as per her request, continue with core strengthening   PT Home Exercise Plan progress as needed   Consulted and Agree with Plan of Care Patient        Problem List Patient Active  Problem List   Diagnosis Date Noted  . Pulmonary nodules 08/31/2011  . Hemoptysis, unspecified 08/31/2011  . ANXIETY 02/13/2010  . DEPRESSION 02/13/2010  . DIARRHEA 02/13/2010  . ABDOMINAL PAIN-PERIUMBILICAL 08/18/4816    Latarshia Jersey,PT 10/09/2014, 3:27 PM  [facility] [address], [place], [place], [address] Phone: [phone]   Fax:  [phone]

## 2014-10-11 ENCOUNTER — Encounter: Payer: Medicare Other | Admitting: Physical Therapy

## 2014-10-17 ENCOUNTER — Encounter: Payer: Self-pay | Admitting: Physical Therapy

## 2014-10-17 ENCOUNTER — Ambulatory Visit: Payer: Medicare Other | Admitting: Physical Therapy

## 2014-10-17 DIAGNOSIS — M79605 Pain in left leg: Secondary | ICD-10-CM

## 2014-10-17 DIAGNOSIS — M545 Low back pain, unspecified: Secondary | ICD-10-CM

## 2014-10-17 NOTE — Therapy (Signed)
[facility] [address] [place], [place], [address] Phone: [phone]   Fax:  [phone]  Physical Therapy Treatment  Patient Details  Name: Shelly [name] MRN: [id number] Date of Birth: [birth year] Referring Provider:  Darcus Austin, MD  Encounter Date: 10/17/2014      PT End of Session - 10/17/14 1532    Visit Number 7   Number of Visits 10   Date for PT Re-Evaluation 11/07/14   PT Start Time 1450   PT Stop Time 1545   PT Time Calculation (min) 55 min   Activity Tolerance Patient tolerated treatment well   Behavior During Therapy [facility] for tasks assessed/performed      Past Medical History  Diagnosis Date  . Allergic rhinitis   . Hypotension   . Arthritis     right hand  . Transient gluten sensitivity     diarrhea and stomaCH pains    Past Surgical History  Procedure Laterality Date  . Gallbladder surgery  2005  . Tubal ligation  1981  . Eus N/A 06/28/2013    Procedure: ESOPHAGEAL ENDOSCOPIC ULTRASOUND (EUS) RADIAL;  Surgeon: Arta Silence, MD;  [facility]: [facility];  Service: Endoscopy;  Laterality: N/A;    There were no vitals filed for this visit.  Visit Diagnosis:  Lumbar pain with radiation down left leg      Subjective Assessment - 10/17/14 1454    Subjective Had a few good days after last weeks sessoin. Starting yesterday pain increased in back, leg and calf.    Currently in Pain? Yes   Pain Score 5    Pain Location Back   Pain Orientation Left   Pain Descriptors / Indicators Shooting   Aggravating Factors  Gardening   Pain Relieving Factors Walking, standing   Multiple Pain Sites No                         [facility] Adult PT Treatment/Exercise - 10/17/14 0001    Knee/Hip Exercises: Aerobic   Nustep Started but stopped after 2 min secondary to increasing pain.   Moist Heat Therapy   Number Minutes Moist Heat 15 Minutes   Moist Heat Location Lumbar Spine   Electrical Stimulation   Electrical Stimulation Location Lt low back   Electrical Stimulation Action IFC   Electrical Stimulation Parameters 80-150 HZ sidelying   Electrical Stimulation Goals Pain  muscle inhibition   Ultrasound   Ultrasound Location LT low back   Ultrasound Parameters 100% 1.2wtcm2 1 MZ   Ultrasound Goals Pain  Muscle spasm   Manual Therapy   Manual Therapy Soft tissue mobilization;Myofascial release   Soft tissue mobilization LT lumbar and paraspinals                  PT Short Term Goals - 10/17/14 1533    PT SHORT TERM GOAL #1   Title independent with initial HEP   Time 4   Period Weeks   Status Achieved   PT SHORT TERM GOAL #2   Title ability to return demonstration with correct body mechanics with daily tasks   Time 4   Period Weeks   Status On-going  Pt still bending and stooping ina staining fashion   PT SHORT TERM GOAL #3   Title pain with daily tasks decreased >/= 25%   Time 4   Period Weeks   Status On-going  Not consistent this week.  PT Long Term Goals - 10/09/14 1526    PT LONG TERM GOAL #1   Title independent with HEP   Time 8   Period Weeks   Status On-going  still learning exercises   PT LONG TERM GOAL #2   Title sit for 1- 2 hours with pain decreased >/= 75%   Time 8   Period Weeks   Status On-going   PT LONG TERM GOAL #3   Title perform daily tasks with pain decreased >/= 75%   Time 8   Period Weeks   Status On-going   PT LONG TERM GOAL #4   Title sleep without difficulty due to pain decreased   Time 8   Period Weeks   Status On-going   PT LONG TERM GOAL #5   Title sit in recliner with her cats with minimal difficulty   Time 8   Period Weeks   Status On-going               Plan - 10/17/14 1522    Clinical Impression Statement Pt did a lot of gardening last week and feels this is why her pain has exacerbated. She reportes she did a lot of bending and unsupported stooping. Pt's back  muscles were in significant spasm today. No new goals were  met this week. Sh ewill continue with her current HEP.    Pt will benefit from skilled therapeutic intervention in order to improve on the following deficits Decreased activity tolerance;Decreased mobility;Decreased strength;Improper body mechanics;Pain;Increased muscle spasms;Decreased endurance;Decreased range of motion;Increased fascial restricitons   Rehab Potential Excellent   Clinical Impairments Affecting Rehab Potential None   PT Frequency 2x / week   PT Duration 8 weeks   PT Treatment/Interventions Taping;Manual techniques;Patient/family education;Neuromuscular re-education;Traction;Ultrasound;Therapeutic activities;Therapeutic exercise;Moist Heat;Electrical Stimulation;Cryotherapy;ADLs/Self Care Home Management   PT Next Visit Plan See if pain better, continue with core strengthening if better.    Consulted and Agree with Plan of Care Patient        Problem List Patient Active Problem List   Diagnosis Date Noted  . Pulmonary nodules 08/31/2011  . Hemoptysis, unspecified 08/31/2011  . ANXIETY 02/13/2010  . DEPRESSION 02/13/2010  . DIARRHEA 02/13/2010  . ABDOMINAL PAIN-PERIUMBILICAL 44/01/270    Weslyn Holsonback, PTA 10/17/2014, 3:35 PM  [facility] [address], [place], [place], [address] Phone: [phone]   Fax:  [phone]

## 2014-10-24 ENCOUNTER — Ambulatory Visit: Payer: Medicare Other | Admitting: Physical Therapy

## 2014-10-24 ENCOUNTER — Encounter: Payer: Self-pay | Admitting: Physical Therapy

## 2014-10-24 DIAGNOSIS — M545 Low back pain, unspecified: Secondary | ICD-10-CM

## 2014-10-24 DIAGNOSIS — M79605 Pain in left leg: Secondary | ICD-10-CM

## 2014-10-24 NOTE — Therapy (Signed)
[facility] [address] [place], [place], [address] Phone: [phone]   Fax:  [phone]  Physical Therapy Treatment  Patient Details  Name: Shelly [name] MRN: [id number] Date of Birth: 10/24/1945 No Data Recorded  Encounter Date: 10/24/2014      PT End of Session - 10/24/14 1514    Visit Number 8   Number of Visits 10   Date for PT Re-Evaluation 11/07/14   PT Start Time 6962   PT Stop Time 1525   PT Time Calculation (min) 38 min   Activity Tolerance Patient tolerated treatment well   Behavior During Therapy [facility] for tasks assessed/performed      Past Medical History  Diagnosis Date  . Allergic rhinitis   . Hypotension   . Arthritis     right hand  . Transient gluten sensitivity     diarrhea and stomaCH pains    Past Surgical History  Procedure Laterality Date  . Gallbladder surgery  2005  . Tubal ligation  1981  . Eus N/A 06/28/2013    Procedure: ESOPHAGEAL ENDOSCOPIC ULTRASOUND (EUS) RADIAL;  Surgeon: Arta Silence, MD;  Location: [facility];  Service: Endoscopy;  Laterality: N/A;    There were no vitals filed for this visit.  Visit Diagnosis:  Lumbar pain with radiation down left leg      Subjective Assessment - 10/24/14 1450    Subjective Much, much better since last week. Back feels weak.    Currently in Pain? No/denies   Aggravating Factors  HAd a good week.   Pain Relieving Factors Therapy has helped.    Multiple Pain Sites No                         [facility] Adult PT Treatment/Exercise - 10/24/14 0001    Lumbar Exercises: Supine   Ab Set --  TA series added for HEP through leg movements.                 PT Education - 10/24/14 1455    Education provided Yes   Education Details TA series for [employer]) Educated Patient   Methods Explanation;Demonstration;Tactile cues;Verbal cues;Handout   Comprehension Verbalized understanding;Returned  demonstration          PT Short Term Goals - 10/24/14 1501    PT SHORT TERM GOAL #2   Title ability to return demonstration with correct body mechanics with daily tasks   Time 4   Period Weeks   Status Achieved   PT SHORT TERM GOAL #3   Title pain with daily tasks decreased >/= 25%   Time 4   Period Weeks   Status Achieved  Based on last week, pt reports 50%-70%.           PT Long Term Goals - 10/09/14 1526    PT LONG TERM GOAL #1   Title independent with HEP   Time 8   Period Weeks   Status On-going  still learning exercises   PT LONG TERM GOAL #2   Title sit for 1- 2 hours with pain decreased >/= 75%   Time 8   Period Weeks   Status On-going   PT LONG TERM GOAL #3   Title perform daily tasks with pain decreased >/= 75%   Time 8   Period Weeks   Status On-going   PT LONG TERM GOAL #4   Title sleep without difficulty due to pain  decreased   Time 8   Period Weeks   Status On-going   PT LONG TERM GOAL #5   Title sit in recliner with her cats with minimal difficulty   Time 8   Period Weeks   Status On-going               Plan - 10/24/14 1523    Clinical Impression Statement Pt has had no nerve pain since last visit. Her main complaint today is of her back feeling week.  We added core stabilization exercises to her home program today.  ALL STGs met this week. She reports she is more aware of her body mechanics when gardening which has been very helpful.    Pt will benefit from skilled therapeutic intervention in order to improve on the following deficits Decreased activity tolerance;Decreased mobility;Decreased strength;Improper body mechanics;Pain;Increased muscle spasms;Decreased endurance;Decreased range of motion;Increased fascial restricitons   Rehab Potential Excellent   Clinical Impairments Affecting Rehab Potential None   PT Frequency 2x / week   PT Duration 8 weeks   PT Treatment/Interventions Taping;Manual techniques;Patient/family  education;Neuromuscular re-education;Traction;Ultrasound;Therapeutic activities;Therapeutic exercise;Moist Heat;Electrical Stimulation;Cryotherapy;ADLs/Self Care Home Management   PT Next Visit Plan Review core HEP and progress.    Consulted and Agree with Plan of Care Patient        Problem List Patient Active Problem List   Diagnosis Date Noted  . Pulmonary nodules 08/31/2011  . Hemoptysis, unspecified 08/31/2011  . ANXIETY 02/13/2010  . DEPRESSION 02/13/2010  . DIARRHEA 02/13/2010  . ABDOMINAL PAIN-PERIUMBILICAL 21/30/8657    Porfiria Heinrich, PTA 10/24/2014, 3:27 PM  [facility] [address], [place], [place], [address] Phone: [phone]   Fax:  [phone]  Name: ELIYAH MCSHEA MRN: [id number] Date of Birth: October 30, 1945   Pt on moist heating pad for all exercise instruction.

## 2014-10-24 NOTE — Patient Instructions (Signed)

## 2014-10-31 ENCOUNTER — Ambulatory Visit: Payer: Medicare Other | Admitting: Physical Therapy

## 2014-10-31 ENCOUNTER — Encounter: Payer: Self-pay | Admitting: Physical Therapy

## 2014-10-31 DIAGNOSIS — M545 Low back pain, unspecified: Secondary | ICD-10-CM

## 2014-10-31 DIAGNOSIS — M79605 Pain in left leg: Secondary | ICD-10-CM

## 2014-10-31 NOTE — Patient Instructions (Signed)
Strengthening: Hip Abduction (Side-Lying)    Tighten muscles on front of left thigh, then lift leg _6___ inches from surface, keeping knee locked.  Repeat _15___ times per set. Do __1__ sets per session. Do __1__ sessions per day.  http://orth.exer.us/622   Copyright  VHI. All rights reserved.  Mid-Back Stretch    Push chest toward floor, reaching forward as far as possible. Hold _15___ seconds. Repeat _2___ times per set. Do ___1_ sets per session. Do __1__ sessions per day.  http://orth.exer.us/130   Copyright  VHI. All rights reserved.  Mid-Back Rotation Stretch    Reach to each side as far as possible, keeping chest low to floor. Hold _15___ seconds. Repeat __2__ times per set. Do __1__ sets per session. Do __1__ sessions per day.  http://orth.exer.us/132   Copyright  VHI. All rights reserved.  [place] [address] [place], [place] [address] Phone # [phone] Fax [phone]

## 2014-10-31 NOTE — Therapy (Signed)
[facility] [address] [place], [place], [address] Phone: [phone]   Fax:  [phone]  Physical Therapy Treatment  Patient Details  Name: Shelly [name] MRN: [id number] Date of Birth: November 08, 1945 Referring Provider: Dr. Darcus Austin  Encounter Date: 10/31/2014      PT End of Session - 10/31/14 1519    Visit Number 9   Date for PT Re-Evaluation 11/07/14   PT Start Time 1445   PT Stop Time 1525   PT Time Calculation (min) 40 min   Activity Tolerance Patient tolerated treatment well   Behavior During Therapy [facility] for tasks assessed/performed      Past Medical History  Diagnosis Date  . Allergic rhinitis   . Hypotension   . Arthritis     right hand  . Transient gluten sensitivity     diarrhea and stomaCH pains    Past Surgical History  Procedure Laterality Date  . Gallbladder surgery  2005  . Tubal ligation  1981  . Eus N/A 06/28/2013    Procedure: ESOPHAGEAL ENDOSCOPIC ULTRASOUND (EUS) RADIAL;  Surgeon: Arta Silence, MD;  [facility];  Service: Endoscopy;  Laterality: N/A;    There were no vitals filed for this visit.  Visit Diagnosis:  Lumbar pain with radiation down left leg          [facility] PT Assessment - 10/31/14 0001    Assessment   Medical Diagnosis Facet OA L4-5, L5 nerve root irritation   Referring Provider Dr. Darcus Austin   Onset Date/Surgical Date 04/06/14   Prior Therapy None   Precautions   Precautions None   Balance Screen   Has the patient fallen in the past 6 months No   Has the patient had a decrease in activity level because of a fear of falling?  No   Is the patient reluctant to leave their home because of a fear of falling?  No   Prior Function   Level of Independence Needs assistance with ADLs;Needs assistance with homemaking   Cognition   Overall Cognitive Status Within Functional Limits for tasks assessed   Observation/Other Assessments   Focus on  Therapeutic Outcomes (FOTO)  39% limitation   AROM   AROM Assessment Site Lumbar   Lumbar Flexion full   Lumbar Extension full   Lumbar - Right Side Bend full   Lumbar - Left Side Bend full   Strength   Overall Strength Comments bil. hip strength 4+/5   Palpation   SI assessment  pelvis in correct slignment                     [facility] Adult PT Treatment/Exercise - 10/31/14 0001    Lumbar Exercises: Aerobic   Stationary Bike L1 7 min   Knee/Hip Exercises: Sidelying   Hip ABduction 1 set;10 reps;Left;Right                PT Education - 10/31/14 1518    Education provided Yes   Education Details hip abduction, prayer stretch forward and sideways   Person(s) Educated Patient   Methods Explanation;Demonstration;Handout   Comprehension Returned demonstration;Verbalized understanding          PT Short Term Goals - 10/31/14 1521    PT SHORT TERM GOAL #1   Title independent with initial HEP   Time 4   Period Weeks   Status Achieved   PT SHORT TERM GOAL #2   Title ability to return demonstration with  correct body mechanics with daily tasks   Time 4   Period Weeks   Status Achieved   PT SHORT TERM GOAL #3   Title pain with daily tasks decreased >/= 25%   Time 4   Period Weeks   Status Achieved           PT Long Term Goals - 2014/11/02 1515    PT LONG TERM GOAL #1   Title independent with HEP   Time 8   Period Weeks   Status Achieved   PT LONG TERM GOAL #2   Title sit for 1- 2 hours with pain decreased >/= 75%   Time 8   Period Weeks   Status Achieved   PT LONG TERM GOAL #3   Title perform daily tasks with pain decreased >/= 75%   Time 8   Period Weeks   Status Achieved   PT LONG TERM GOAL #4   Title sleep without difficulty due to pain decreased   Time 8   Period Weeks   Status Achieved   PT LONG TERM GOAL #5   Title sit in recliner with her cats with minimal difficulty   Time 8   Period Weeks   Status Achieved                Plan - 2014/11/02 1519    Clinical Impression Statement Patient has met all of her STG and LTG's.  FOTO score is 39% limitation.  Lumbar ROM is full.  Bilateral hip strength is 4+/5.   Patient is indepdent with he rHEP.  Paitent reports pain decreased by 75%. Patient is able to sleep without difficulty.     Pt will benefit from skilled therapeutic intervention in order to improve on the following deficits Decreased activity tolerance;Decreased mobility;Decreased strength;Improper body mechanics;Pain;Increased muscle spasms;Decreased endurance;Decreased range of motion;Increased fascial restricitons   Rehab Potential Excellent   Clinical Impairments Affecting Rehab Potential None   PT Treatment/Interventions Taping;Manual techniques;Patient/family education;Neuromuscular re-education;Traction;Ultrasound;Therapeutic activities;Therapeutic exercise;Moist Heat;Electrical Stimulation;Cryotherapy;ADLs/Self Care Home Management   PT Next Visit Plan discharge to HEP   PT Home Exercise Plan Current HEP   Consulted and Agree with Plan of Care Patient          G-Codes - Nov 02, 2014 1453    Functional Assessment Tool Used FOTO score 39%  limitation   Functional Limitation Other PT primary   Other PT Primary Goal Status (E3212) At least 40 percent but less than 60 percent impaired, limited or restricted   Other PT Primary Discharge Status (Y4825) At least 20 percent but less than 40 percent impaired, limited or restricted      Problem List Patient Active Problem List   Diagnosis Date Noted  . Pulmonary nodules 08/31/2011  . Hemoptysis, unspecified 08/31/2011  . ANXIETY 02/13/2010  . DEPRESSION 02/13/2010  . DIARRHEA 02/13/2010  . ABDOMINAL PAIN-PERIUMBILICAL 00/37/0488    Shelly [name],PT November 02, 2014, 3:26 PM  [facility] [address], [place], [place], [address] Phone: [phone]   Fax:  [phone]  Name: Shelly [name] MRN: [id number] Date of Birth: [birth year]  PHYSICAL THERAPY DISCHARGE SUMMARY  Visits from Start of Care: 9  Current functional level related to goals / functional outcomes: See above.    Remaining deficits: See above  Education / Equipment: HEP Plan: Patient agrees to discharge.  Patient goals were met. Patient is being discharged due to meeting the stated rehab goals.  Thank you for the referral. Earlie Counts, PT 11/02/2014  3:23 PM  ?????

## 2015-02-06 DIAGNOSIS — M810 Age-related osteoporosis without current pathological fracture: Secondary | ICD-10-CM | POA: Diagnosis not present

## 2015-02-06 DIAGNOSIS — Z1231 Encounter for screening mammogram for malignant neoplasm of breast: Secondary | ICD-10-CM | POA: Diagnosis not present

## 2015-02-13 DIAGNOSIS — M810 Age-related osteoporosis without current pathological fracture: Secondary | ICD-10-CM | POA: Diagnosis not present

## 2015-03-05 DIAGNOSIS — H25813 Combined forms of age-related cataract, bilateral: Secondary | ICD-10-CM | POA: Diagnosis not present

## 2015-03-05 DIAGNOSIS — H52223 Regular astigmatism, bilateral: Secondary | ICD-10-CM | POA: Diagnosis not present

## 2015-03-05 DIAGNOSIS — H5213 Myopia, bilateral: Secondary | ICD-10-CM | POA: Diagnosis not present

## 2015-03-05 DIAGNOSIS — H524 Presbyopia: Secondary | ICD-10-CM | POA: Diagnosis not present

## 2015-05-30 DIAGNOSIS — K219 Gastro-esophageal reflux disease without esophagitis: Secondary | ICD-10-CM | POA: Diagnosis not present

## 2015-09-10 DIAGNOSIS — M4806 Spinal stenosis, lumbar region: Secondary | ICD-10-CM | POA: Diagnosis not present

## 2015-09-10 DIAGNOSIS — M5416 Radiculopathy, lumbar region: Secondary | ICD-10-CM | POA: Diagnosis not present

## 2015-10-03 DIAGNOSIS — F411 Generalized anxiety disorder: Secondary | ICD-10-CM | POA: Diagnosis not present

## 2015-10-14 DIAGNOSIS — F411 Generalized anxiety disorder: Secondary | ICD-10-CM | POA: Diagnosis not present

## 2016-01-16 DIAGNOSIS — F411 Generalized anxiety disorder: Secondary | ICD-10-CM | POA: Diagnosis not present

## 2016-01-29 DIAGNOSIS — E559 Vitamin D deficiency, unspecified: Secondary | ICD-10-CM | POA: Diagnosis not present

## 2016-01-29 DIAGNOSIS — Z23 Encounter for immunization: Secondary | ICD-10-CM | POA: Diagnosis not present

## 2016-01-29 DIAGNOSIS — H9311 Tinnitus, right ear: Secondary | ICD-10-CM | POA: Diagnosis not present

## 2016-01-29 DIAGNOSIS — Z720 Tobacco use: Secondary | ICD-10-CM | POA: Diagnosis not present

## 2016-01-29 DIAGNOSIS — F411 Generalized anxiety disorder: Secondary | ICD-10-CM | POA: Diagnosis not present

## 2016-01-29 DIAGNOSIS — M810 Age-related osteoporosis without current pathological fracture: Secondary | ICD-10-CM | POA: Diagnosis not present

## 2016-01-29 DIAGNOSIS — E782 Mixed hyperlipidemia: Secondary | ICD-10-CM | POA: Diagnosis not present

## 2016-01-29 DIAGNOSIS — Z0000 Encounter for general adult medical examination without abnormal findings: Secondary | ICD-10-CM | POA: Diagnosis not present

## 2016-01-29 DIAGNOSIS — Z131 Encounter for screening for diabetes mellitus: Secondary | ICD-10-CM | POA: Diagnosis not present

## 2016-02-18 DIAGNOSIS — J300 Vasomotor rhinitis: Secondary | ICD-10-CM | POA: Diagnosis not present

## 2016-02-18 DIAGNOSIS — H9311 Tinnitus, right ear: Secondary | ICD-10-CM | POA: Diagnosis not present

## 2016-02-18 DIAGNOSIS — H903 Sensorineural hearing loss, bilateral: Secondary | ICD-10-CM | POA: Diagnosis not present

## 2016-03-02 DIAGNOSIS — Z1231 Encounter for screening mammogram for malignant neoplasm of breast: Secondary | ICD-10-CM | POA: Diagnosis not present

## 2016-04-16 ENCOUNTER — Telehealth (INDEPENDENT_AMBULATORY_CARE_PROVIDER_SITE_OTHER): Payer: Self-pay | Admitting: Physical Medicine and Rehabilitation

## 2016-04-16 DIAGNOSIS — F411 Generalized anxiety disorder: Secondary | ICD-10-CM

## 2016-04-16 MED ORDER — DIAZEPAM 5 MG PO TABS: ORAL_TABLET | ORAL | 0 refills | Status: DC

## 2016-04-16 NOTE — Telephone Encounter (Signed)
Signed prescription faxed to patient's pharmacy.

## 2016-04-16 NOTE — Telephone Encounter (Signed)
Okay to repeat and patient will need Valium preprocedure. Did you look up on SRS and see what we gave her for sedation and then 70 a message back.

## 2016-04-28 ENCOUNTER — Encounter (INDEPENDENT_AMBULATORY_CARE_PROVIDER_SITE_OTHER): Payer: Self-pay

## 2016-04-28 ENCOUNTER — Ambulatory Visit (INDEPENDENT_AMBULATORY_CARE_PROVIDER_SITE_OTHER): Payer: Medicare Other

## 2016-04-28 ENCOUNTER — Encounter (INDEPENDENT_AMBULATORY_CARE_PROVIDER_SITE_OTHER): Payer: Self-pay | Admitting: Physical Medicine and Rehabilitation

## 2016-04-28 ENCOUNTER — Ambulatory Visit (INDEPENDENT_AMBULATORY_CARE_PROVIDER_SITE_OTHER): Payer: Medicare Other | Admitting: Physical Medicine and Rehabilitation

## 2016-04-28 VITALS — BP 93/59 | HR 86

## 2016-04-28 DIAGNOSIS — M5416 Radiculopathy, lumbar region: Secondary | ICD-10-CM

## 2016-04-28 MED ORDER — METHYLPREDNISOLONE ACETATE 80 MG/ML IJ SUSP
80.0000 mg | Freq: Once | INTRAMUSCULAR | Status: AC
  Administered 2016-04-28: 80 mg

## 2016-04-28 MED ORDER — IIOPAMIDOL (ISOVUE-250) INJECTION 51%
3.0000 mL | Freq: Once | INTRAVENOUS | Status: AC
  Administered 2016-04-28: 3 mL
  Filled 2016-04-28: qty 50

## 2016-04-28 MED ORDER — LIDOCAINE HCL (PF) 1 % IJ SOLN
2.0000 mL | Freq: Once | INTRAMUSCULAR | Status: AC
  Administered 2016-04-28: 2 mL

## 2016-04-28 NOTE — Progress Notes (Deleted)
Increased Left side low back pain for around 2 weeks. Radiates down leg to calf. Hurts worse first thing in the morning and after she sits for a long period.

## 2016-04-28 NOTE — Progress Notes (Signed)
Shelly [name] - 71 y.o. female MRN [id number]  Date of birth: 1945-12-30  Office Visit Note: Visit Date: 04/28/2016 PCP: Marjorie Smolder, MD Referred by: Darcus Austin, MD  Subjective: Chief Complaint  Patient presents with  . Lower Back - Pain   HPI: Shelly [name] 71 year old Female with Moderate Multifactorial Stenosis at L4-5 with Left Lateral Recess Stenosis. We Had Completed a Couple of Injections Early Last Year with Good Relief of Her Symptoms. We Have Not Seen Her in Many Months. She Says She Was Doing Extremely Well up until Just A Few Weeks Ago When She Started Having Left Radicular Leg Pain. No New Injury No Weakness. She Reports Symptoms Are Very Similar. There Are More of an L5 Distribution. She's Had No Bowel or Bladder Changes. We Are Repeat the Left L5-S1 Intralaminar Epidural Steroid Injection.    ROS Otherwise per HPI.  Assessment & Plan: Visit Diagnoses:  1. Lumbar radiculopathy     Plan: Findings:  Left L5-S1 intralaminar epidural steroid injection.    Meds & Orders:  Meds ordered this encounter  Medications  . lidocaine (PF) (XYLOCAINE) 1 % injection 2 mL  . iopamidol (ISOVUE-250) 51 % injection 3 mL  . methylPREDNISolone acetate (DEPO-MEDROL) injection 80 mg    Orders Placed This Encounter  Procedures  . XR C-ARM NO REPORT  . Epidural Steroid injection    Follow-up: Return if symptoms worsen or fail to improve.   Procedures: No procedures performed  Lumbar Epidural Steroid Injection - Interlaminar Approach with Fluoroscopic Guidance  Patient: Shelly [name]      Date of Birth: 1945-11-07 MRN: [id number] PCP: Marjorie Smolder, MD      Visit Date: 04/28/2016   Universal Protocol:    Date/Time: 04/24/182:32 PM  Consent Given By: the patient  Position: PRONE  Additional Comments: Vital signs were monitored before and after the procedure. Patient was prepped and draped in the usual sterile fashion. The correct patient, procedure, and site  was verified.   Injection Procedure Details:  Procedure Site One Meds Administered:  Meds ordered this encounter  Medications  . lidocaine (PF) (XYLOCAINE) 1 % injection 2 mL  . iopamidol (ISOVUE-250) 51 % injection 3 mL  . methylPREDNISolone acetate (DEPO-MEDROL) injection 80 mg     Laterality: Left  Location/Site:  L5-S1  Needle size: 20 G  Needle type: Tuohy  Needle Placement: Paramedian epidural  Findings:  -Contrast Used: 1 mL iohexol 180 mg iodine/mL   -Comments: Excellent flow of contrast into the epidural space.  Procedure Details: Using a paramedian approach from the side mentioned above, the region overlying the inferior lamina was localized under fluoroscopic visualization and the soft tissues overlying this structure were infiltrated with 4 ml. of 1% Lidocaine without Epinephrine. The Tuohy needle was inserted into the epidural space using a paramedian approach.   The epidural space was localized using loss of resistance along with lateral and bi-planar fluoroscopic views.  After negative aspirate for air, blood, and CSF, a 2 ml. volume of Isovue-250 was injected into the epidural space and the flow of contrast was observed. Radiographs were obtained for documentation purposes.    The injectate was administered into the level noted above.   Additional Comments:  The patient tolerated the procedure well Dressing: Band-Aid    Post-procedure details: Patient was observed during the procedure. Post-procedure instructions were reviewed.  Patient left the clinic in stable condition.    Clinical History: Lumbar MRI 08/18/2014 1 L4-5: Mild disc bulging and moderate  facet hypertrophy result in moderate spinal stenosis, mild right and moderate left lateral recess stenosis, and no significant neural foraminal stenosis. There is the potential for left-sided nerve root impingement in the left lateral recess, particularly of the L5 nerve root.  L5-S1: Mild disc  bulging and moderate facet arthrosis without stenosis. Small right facet joint effusion.  IMPRESSION: 1. Mild disc and moderate facet degeneration at L4-5 resulting in moderate spinal stenosis and left greater than right lateral recess stenosis. 2. Mild disc and facet degeneration elsewhere without stenosis.  She reports that she has been smoking Cigarettes.  She has a 25.00 pack-year smoking history. She has never used smokeless tobacco. No results for input(s): HGBA1C, LABURIC in the last 8760 hours.  Objective:  VS:  HT:    WT:   BMI:     BP:(!) 93/59  HR:86bpm  TEMP: ( )  RESP:98 % Physical Exam  Musculoskeletal:  The patient ambulates without aid with a forward flexed spine with good distal strength.    Ortho Exam Imaging: No results found.  Past Medical/Family/Surgical/Social History: Medications & Allergies reviewed per EMR Patient Active Problem List   Diagnosis Date Noted  . Pulmonary nodules 08/31/2011  . Hemoptysis, unspecified 08/31/2011  . ANXIETY 02/13/2010  . DEPRESSION 02/13/2010  . DIARRHEA 02/13/2010  . ABDOMINAL PAIN-PERIUMBILICAL 38/10/1749   Past Medical History:  Diagnosis Date  . Allergic rhinitis   . Arthritis    right hand  . Hypotension   . Transient gluten sensitivity    diarrhea and stomaCH pains   Family History  Problem Relation Age of Onset  . Prostate cancer Father   . Emphysema Father    Past Surgical History:  Procedure Laterality Date  . EUS N/A 06/28/2013   Procedure: ESOPHAGEAL ENDOSCOPIC ULTRASOUND (EUS) RADIAL;  Surgeon: Arta Silence, MD;  [facility];  Service: Endoscopy;  Laterality: N/A;  . GALLBLADDER SURGERY  2005  . TUBAL LIGATION  1981   Social History   Occupational History  . retired     Social History Main Topics  . Smoking status: Current Every Day Smoker    Packs/day: 0.50    Years: 50.00    Types: Cigarettes  . Smokeless tobacco: Never Used     Comment: smokes approx 4- 5 cigarettes a  day   . Alcohol use No  . Drug use: No  . Sexual activity: Not on file

## 2016-04-28 NOTE — Patient Instructions (Signed)

## 2016-04-28 NOTE — Procedures (Signed)
Lumbar Epidural Steroid Injection - Interlaminar Approach with Fluoroscopic Guidance  Patient: Shelly [name]      Date of Birth: 1945/04/22 MRN: [id number] PCP: Marjorie Smolder, MD      Visit Date: 04/28/2016   Universal Protocol:    Date/Time: 04/24/182:32 PM  Consent Given By: the patient  Position: PRONE  Additional Comments: Vital signs were monitored before and after the procedure. Patient was prepped and draped in the usual sterile fashion. The correct patient, procedure, and site was verified.   Injection Procedure Details:  Procedure Site One Meds Administered:  Meds ordered this encounter  Medications  . lidocaine (PF) (XYLOCAINE) 1 % injection 2 mL  . iopamidol (ISOVUE-250) 51 % injection 3 mL  . methylPREDNISolone acetate (DEPO-MEDROL) injection 80 mg     Laterality: Left  Location/Site:  L5-S1  Needle size: 20 G  Needle type: Tuohy  Needle Placement: Paramedian epidural  Findings:  -Contrast Used: 1 mL iohexol 180 mg iodine/mL   -Comments: Excellent flow of contrast into the epidural space.  Procedure Details: Using a paramedian approach from the side mentioned above, the region overlying the inferior lamina was localized under fluoroscopic visualization and the soft tissues overlying this structure were infiltrated with 4 ml. of 1% Lidocaine without Epinephrine. The Tuohy needle was inserted into the epidural space using a paramedian approach.   The epidural space was localized using loss of resistance along with lateral and bi-planar fluoroscopic views.  After negative aspirate for air, blood, and CSF, a 2 ml. volume of Isovue-250 was injected into the epidural space and the flow of contrast was observed. Radiographs were obtained for documentation purposes.    The injectate was administered into the level noted above.   Additional Comments:  The patient tolerated the procedure well Dressing: Band-Aid    Post-procedure details: Patient was  observed during the procedure. Post-procedure instructions were reviewed.  Patient left the clinic in stable condition.

## 2016-04-30 DIAGNOSIS — H5213 Myopia, bilateral: Secondary | ICD-10-CM | POA: Diagnosis not present

## 2016-04-30 DIAGNOSIS — H25813 Combined forms of age-related cataract, bilateral: Secondary | ICD-10-CM | POA: Diagnosis not present

## 2016-04-30 DIAGNOSIS — H52223 Regular astigmatism, bilateral: Secondary | ICD-10-CM | POA: Diagnosis not present

## 2017-02-08 DIAGNOSIS — M810 Age-related osteoporosis without current pathological fracture: Secondary | ICD-10-CM | POA: Diagnosis not present

## 2017-02-08 DIAGNOSIS — Z1159 Encounter for screening for other viral diseases: Secondary | ICD-10-CM | POA: Diagnosis not present

## 2017-02-08 DIAGNOSIS — E559 Vitamin D deficiency, unspecified: Secondary | ICD-10-CM | POA: Diagnosis not present

## 2017-02-08 DIAGNOSIS — Z0000 Encounter for general adult medical examination without abnormal findings: Secondary | ICD-10-CM | POA: Diagnosis not present

## 2017-02-08 DIAGNOSIS — E782 Mixed hyperlipidemia: Secondary | ICD-10-CM | POA: Diagnosis not present

## 2017-03-17 DIAGNOSIS — M8589 Other specified disorders of bone density and structure, multiple sites: Secondary | ICD-10-CM | POA: Diagnosis not present

## 2017-03-17 DIAGNOSIS — Z1231 Encounter for screening mammogram for malignant neoplasm of breast: Secondary | ICD-10-CM | POA: Diagnosis not present

## 2017-03-17 DIAGNOSIS — M810 Age-related osteoporosis without current pathological fracture: Secondary | ICD-10-CM | POA: Diagnosis not present

## 2017-05-10 ENCOUNTER — Telehealth (INDEPENDENT_AMBULATORY_CARE_PROVIDER_SITE_OTHER): Payer: Self-pay | Admitting: Physical Medicine and Rehabilitation

## 2017-05-10 NOTE — Telephone Encounter (Signed)
Ok to repeat if she feels similar location and no trauma, otherwise OV, well known if you see old notes, ask about pre-procedure valium

## 2017-05-11 NOTE — Telephone Encounter (Signed)
Patient is scheduled for 5/14. She would like Valium prior. [facility] [place].

## 2017-05-14 ENCOUNTER — Other Ambulatory Visit (INDEPENDENT_AMBULATORY_CARE_PROVIDER_SITE_OTHER): Payer: Self-pay | Admitting: Physical Medicine and Rehabilitation

## 2017-05-14 DIAGNOSIS — H5213 Myopia, bilateral: Secondary | ICD-10-CM | POA: Diagnosis not present

## 2017-05-14 DIAGNOSIS — F411 Generalized anxiety disorder: Secondary | ICD-10-CM

## 2017-05-14 DIAGNOSIS — H52221 Regular astigmatism, right eye: Secondary | ICD-10-CM | POA: Diagnosis not present

## 2017-05-14 DIAGNOSIS — H25813 Combined forms of age-related cataract, bilateral: Secondary | ICD-10-CM | POA: Diagnosis not present

## 2017-05-14 DIAGNOSIS — H43813 Vitreous degeneration, bilateral: Secondary | ICD-10-CM | POA: Diagnosis not present

## 2017-05-14 MED ORDER — DIAZEPAM 5 MG PO TABS: ORAL_TABLET | ORAL | 0 refills | Status: DC

## 2017-05-14 NOTE — Telephone Encounter (Signed)
done

## 2017-05-14 NOTE — Telephone Encounter (Signed)
Left message notifying patient that prescription has been sent.

## 2017-05-14 NOTE — Progress Notes (Signed)
Pre-procedure valium

## 2017-05-18 ENCOUNTER — Ambulatory Visit (INDEPENDENT_AMBULATORY_CARE_PROVIDER_SITE_OTHER): Payer: Medicare Other | Admitting: Physical Medicine and Rehabilitation

## 2017-05-18 ENCOUNTER — Encounter (INDEPENDENT_AMBULATORY_CARE_PROVIDER_SITE_OTHER): Payer: Self-pay | Admitting: Physical Medicine and Rehabilitation

## 2017-05-18 ENCOUNTER — Ambulatory Visit (INDEPENDENT_AMBULATORY_CARE_PROVIDER_SITE_OTHER): Payer: Medicare Other

## 2017-05-18 VITALS — BP 87/56 | HR 86 | Temp 98.6°F

## 2017-05-18 DIAGNOSIS — M5442 Lumbago with sciatica, left side: Secondary | ICD-10-CM

## 2017-05-18 DIAGNOSIS — M5416 Radiculopathy, lumbar region: Secondary | ICD-10-CM | POA: Diagnosis not present

## 2017-05-18 DIAGNOSIS — M5116 Intervertebral disc disorders with radiculopathy, lumbar region: Secondary | ICD-10-CM

## 2017-05-18 DIAGNOSIS — G8929 Other chronic pain: Secondary | ICD-10-CM | POA: Diagnosis not present

## 2017-05-18 DIAGNOSIS — M48062 Spinal stenosis, lumbar region with neurogenic claudication: Secondary | ICD-10-CM | POA: Diagnosis not present

## 2017-05-18 DIAGNOSIS — F411 Generalized anxiety disorder: Secondary | ICD-10-CM

## 2017-05-18 DIAGNOSIS — M7918 Myalgia, other site: Secondary | ICD-10-CM

## 2017-05-18 MED ORDER — METHYLPREDNISOLONE ACETATE 80 MG/ML IJ SUSP
80.0000 mg | Freq: Once | INTRAMUSCULAR | Status: AC
  Administered 2017-05-18: 80 mg

## 2017-05-18 NOTE — Procedures (Signed)
Lumbar Epidural Steroid Injection - Interlaminar Approach with Fluoroscopic Guidance  Patient: Shelly [name]      Date of Birth: Feb 01, 1945 MRN: [id number] PCP: Darcus Austin, MD      Visit Date: 05/18/2017   Universal Protocol:     Consent Given By: the patient  Position: PRONE  Additional Comments: Vital signs were monitored before and after the procedure. Patient was prepped and draped in the usual sterile fashion. The correct patient, procedure, and site was verified.   Injection Procedure Details:  Procedure Site One Meds Administered:  Meds ordered this encounter  Medications  . methylPREDNISolone acetate (DEPO-MEDROL) injection 80 mg     Laterality: Left  Location/Site:  L5-S1  Needle size: 20 G  Needle type: Tuohy  Needle Placement: Paramedian epidural  Findings:   -Comments: Excellent flow of contrast into the epidural space.  Procedure Details: Using a paramedian approach from the side mentioned above, the region overlying the inferior lamina was localized under fluoroscopic visualization and the soft tissues overlying this structure were infiltrated with 4 ml. of 1% Lidocaine without Epinephrine. The Tuohy needle was inserted into the epidural space using a paramedian approach.   The epidural space was localized using loss of resistance along with lateral and bi-planar fluoroscopic views.  After negative aspirate for air, blood, and CSF, a 2 ml. volume of Isovue-250 was injected into the epidural space and the flow of contrast was observed. Radiographs were obtained for documentation purposes.    The injectate was administered into the level noted above.   Additional Comments:  The patient tolerated the procedure well Dressing: Band-Aid    Post-procedure details: Patient was observed during the procedure. Post-procedure instructions were reviewed.  Patient left the clinic in stable condition.

## 2017-05-18 NOTE — Progress Notes (Signed)
 .  Numeric Pain Rating Scale and Functional Assessment Average Pain 6   In the last MONTH (on 0-10 scale) has pain interfered with the following?  1. General activity like being  able to carry out your everyday physical activities such as walking, climbing stairs, carrying groceries, or moving a chair?  Rating(4)   +Driver, -BT, -Dye Allergies.  

## 2017-05-18 NOTE — Patient Instructions (Signed)

## 2017-05-28 DIAGNOSIS — R208 Other disturbances of skin sensation: Secondary | ICD-10-CM | POA: Diagnosis not present

## 2017-05-28 DIAGNOSIS — S30860A Insect bite (nonvenomous) of lower back and pelvis, initial encounter: Secondary | ICD-10-CM | POA: Diagnosis not present

## 2017-05-28 DIAGNOSIS — L299 Pruritus, unspecified: Secondary | ICD-10-CM | POA: Diagnosis not present

## 2017-05-28 NOTE — Progress Notes (Signed)
Shelly [name] - 72 y.o. female MRN [id number]  Date of birth: 03-Apr-1945  Office Visit Note: Visit Date: 05/18/2017 PCP: Darcus Austin, MD Referred by: Darcus Austin, MD  Subjective: Chief Complaint  Patient presents with  . Lower Back - Pain   HPI: Shelly [name] is a 72 year old female who [place] for orthopedic care and ultimately was having low back and radicular leg pain and MRI 16 showing multifactorial moderate stenosis at L4-5.  We went on to complete a couple of injections that Shelly [name] responded to quite well and really had a lot of relief for many many months.  Shelly [name] does have some depression and anxiety issues and we do usually premedicate her with some Valium prior to injection.  The first injection we ever performed was problematic for her and mainly it was a lot of anxiety issues at the time because since that time Shelly [name] is done extremely well with the injections with very little discomfort.  Last time we saw her was last year almost a year ago.  Since that time Shelly [name] has had worsening low back pain and what Shelly [name] refers to is nerve pain in the buttock but also now down the side of the leg in a classic L5 distribution.  This began several months ago but really over the last 3 weeks has become fairly severe.  Shelly [name] reports this is worse first thing in the morning and if Shelly [name] sits for a long period of time.  Is also worse if Shelly [name] stands and walks.  Some activity actually gets her to feel better.  Medications have not been very beneficial and Shelly [name] has intolerances to several medications.  Shelly [name] is able to use ibuprofen at times once not severe.  Shelly [name] has had physical therapy in the past and does try to do those exercises.  Shelly [name] has not had any bowel or bladder dysfunction or focal weakness or fevers chills or night sweats.  Shelly [name] has had no unintended weight loss.  No trauma since we have last seen her.  MRI again reviewed with her today.   Review of Systems  Constitutional: Negative for chills, fever,  malaise/fatigue and weight loss.  HENT: Negative for hearing loss and sinus pain.   Eyes: Negative for blurred vision, double vision and photophobia.  Respiratory: Negative for cough and shortness of breath.   Cardiovascular: Negative for chest pain, palpitations and leg swelling.  Gastrointestinal: Negative for abdominal pain, nausea and vomiting.  Genitourinary: Negative for flank pain.  Musculoskeletal: Positive for back pain and joint pain. Negative for myalgias.       Left hip and leg pain  Skin: Negative for itching and rash.  Neurological: Negative for tremors, focal weakness and weakness.  Endo/Heme/Allergies: Negative.   Psychiatric/Behavioral: Negative for depression.  All other systems reviewed and are negative.  Otherwise per HPI.  Assessment & Plan: Visit Diagnoses:  1. Lumbar radiculopathy   2. Spinal stenosis of lumbar region with neurogenic claudication   3. Radiculopathy due to lumbar intervertebral disc disorder   4. Chronic bilateral low back pain with left-sided sciatica   5. Myofascial pain syndrome   6. Anxiety state     Plan: Findings:  History of chronic low back pain and chronic pain syndrome with some level of myofascial pain syndrome but clearly radicular type pain from multifactorial stenosis at L4-5.  Shelly [name] has some pain concordant with facet joint arthropathy but this is really not her biggest complaint and usually does not bring her in.  Shelly [name]  is having left L5 radicular pain particularly with prolonged sitting and prolonged walking.  Shelly [name] does still try to exercise which I think is great.  We spent an extended period of time again talking about injections and there ability to be repeated and definitive treatment for stenosis including surgery.  Greater than 50% of this visit (total duration 20 minutes) was spent in counseling and coordination of care discussing natural history of lumbar stenosis and treatment plans.    Meds & Orders:  Meds ordered this  encounter  Medications  . methylPREDNISolone acetate (DEPO-MEDROL) injection 80 mg    Orders Placed This Encounter  Procedures  . XR C-ARM NO REPORT  . Epidural Steroid injection    Follow-up: Return if symptoms worsen or fail to improve.   Procedures: No procedures performed  Lumbar Epidural Steroid Injection - Interlaminar Approach with Fluoroscopic Guidance  Shelly [name]      Date of Birth: 09/04/1945 MRN: [id number] PCP: Darcus Austin, MD      Visit Date: 05/18/2017   Universal Protocol:     Consent Given By: the patient  Position: PRONE  Additional Comments: Vital signs were monitored before and after the procedure. Patient was prepped and draped in the usual sterile fashion. The correct patient, procedure, and site was verified.   Injection Procedure Details:  Procedure Site One Meds Administered:  Meds ordered this encounter  Medications  . methylPREDNISolone acetate (DEPO-MEDROL) injection 80 mg     Laterality: Left  Location/Site:  L5-S1  Needle size: 20 G  Needle type: Tuohy  Needle Placement: Paramedian epidural  Findings:   -Comments: Excellent flow of contrast into the epidural space.  Procedure Details: Using a paramedian approach from the side mentioned above, the region overlying the inferior lamina was localized under fluoroscopic visualization and the soft tissues overlying this structure were infiltrated with 4 ml. of 1% Lidocaine without Epinephrine. The Tuohy needle was inserted into the epidural space using a paramedian approach.   The epidural space was localized using loss of resistance along with lateral and bi-planar fluoroscopic views.  After negative aspirate for air, blood, and CSF, a 2 ml. volume of Isovue-250 was injected into the epidural space and the flow of contrast was observed. Radiographs were obtained for documentation purposes.    The injectate was administered into the level noted above.   Additional  Comments:  The patient tolerated the procedure well Dressing: Band-Aid    Post-procedure details: Patient was observed during the procedure. Post-procedure instructions were reviewed.  Patient left the clinic in stable condition.   Clinical History: Lumbar MRI 08/18/2014 1 L4-5: Mild disc bulging and moderate facet hypertrophy result in moderate spinal stenosis, mild right and moderate left lateral recess stenosis, and no significant neural foraminal stenosis. There is the potential for left-sided nerve root impingement in the left lateral recess, particularly of the L5 nerve root.  L5-S1: Mild disc bulging and moderate facet arthrosis without stenosis. Small right facet joint effusion.  IMPRESSION: 1. Mild disc and moderate facet degeneration at L4-5 resulting in moderate spinal stenosis and left greater than right lateral recess stenosis. 2. Mild disc and facet degeneration elsewhere without stenosis.   Shelly [name] reports that Shelly [name] has been smoking cigarettes.  Shelly [name] has a 25.00 pack-year smoking history. Shelly [name] has never used smokeless tobacco. No results for input(s): HGBA1C, LABURIC in the last 8760 hours.  Objective:  VS:  HT:    WT:   BMI:     BP:(!) 87/56  HR:86bpm  TEMP:98.6 F (37 C)(Oral)  RESP:96 % Physical Exam  Constitutional: Shelly [name] is oriented to person, place, and time. Shelly [name] appears well-developed and well-nourished.  Eyes: Pupils are equal, round, and reactive to light. Conjunctivae and EOM are normal.  Cardiovascular: Normal rate and intact distal pulses.  Pulmonary/Chest: Effort normal.  Musculoskeletal:  Patient ambulates without aid.  Shelly [name] does stand with a forward flexed lumbar spine.  Shelly [name] has pain with extension of the lumbar spine.  Shelly [name] has some trigger points noted in the paraspinal musculature and mild tenderness over the greater trochanters left more than right.  No pain with hip rotation.  Good distal strength without clonus.  Neurological: Shelly [name] is alert and  oriented to person, place, and time. Shelly [name] exhibits normal muscle tone. Coordination normal.  Skin: Skin is warm and dry. No rash noted. No erythema.  Psychiatric: Shelly [name] has a normal mood and affect. Her behavior is normal.  Nursing note and vitals reviewed.   Ortho Exam Imaging: No results found.  Past Medical/Family/Surgical/Social History: Medications & Allergies reviewed per EMR, new medications updated. Patient Active Problem List   Diagnosis Date Noted  . Pulmonary nodules 08/31/2011  . Hemoptysis, unspecified 08/31/2011  . Anxiety state 02/13/2010  . DEPRESSION 02/13/2010  . DIARRHEA 02/13/2010  . ABDOMINAL PAIN-PERIUMBILICAL 48/18/5631   Past Medical History:  Diagnosis Date  . Allergic rhinitis   . Arthritis    right hand  . Hypotension   . Transient gluten sensitivity    diarrhea and stomaCH pains   Family History  Problem Relation Age of Onset  . Prostate cancer Father   . Emphysema Father    Past Surgical History:  Procedure Laterality Date  . EUS N/A 06/28/2013   Procedure: ESOPHAGEAL ENDOSCOPIC ULTRASOUND (EUS) RADIAL;  Surgeon: Arta Silence, MD;  Location: [facility];  Service: Endoscopy;  Laterality: N/A;  . GALLBLADDER SURGERY  2005  . TUBAL LIGATION  1981   Social History   Occupational History  . Occupation: retired   Tobacco Use  . Smoking status: Current Every Day Smoker    Packs/day: 0.50    Years: 50.00    Pack years: 25.00    Types: Cigarettes  . Smokeless tobacco: Never Used  . Tobacco comment: smokes approx 4- 5 cigarettes a day   Substance and Sexual Activity  . Alcohol use: No  . Drug use: No  . Sexual activity: Not on file

## 2017-06-02 DIAGNOSIS — S30860A Insect bite (nonvenomous) of lower back and pelvis, initial encounter: Secondary | ICD-10-CM | POA: Diagnosis not present

## 2017-08-04 DIAGNOSIS — Z1211 Encounter for screening for malignant neoplasm of colon: Secondary | ICD-10-CM | POA: Diagnosis not present

## 2017-08-04 DIAGNOSIS — T7840XA Allergy, unspecified, initial encounter: Secondary | ICD-10-CM | POA: Diagnosis not present

## 2017-10-06 DIAGNOSIS — K219 Gastro-esophageal reflux disease without esophagitis: Secondary | ICD-10-CM | POA: Diagnosis not present

## 2017-10-06 DIAGNOSIS — Z1211 Encounter for screening for malignant neoplasm of colon: Secondary | ICD-10-CM | POA: Diagnosis not present

## 2017-11-05 DIAGNOSIS — Z23 Encounter for immunization: Secondary | ICD-10-CM | POA: Diagnosis not present

## 2017-11-16 DIAGNOSIS — D126 Benign neoplasm of colon, unspecified: Secondary | ICD-10-CM | POA: Diagnosis not present

## 2017-11-16 DIAGNOSIS — K635 Polyp of colon: Secondary | ICD-10-CM | POA: Diagnosis not present

## 2017-11-16 DIAGNOSIS — K5730 Diverticulosis of large intestine without perforation or abscess without bleeding: Secondary | ICD-10-CM | POA: Diagnosis not present

## 2017-11-16 DIAGNOSIS — K648 Other hemorrhoids: Secondary | ICD-10-CM | POA: Diagnosis not present

## 2017-11-16 DIAGNOSIS — Z1211 Encounter for screening for malignant neoplasm of colon: Secondary | ICD-10-CM | POA: Diagnosis not present

## 2017-11-19 DIAGNOSIS — D126 Benign neoplasm of colon, unspecified: Secondary | ICD-10-CM | POA: Diagnosis not present

## 2017-11-19 DIAGNOSIS — K635 Polyp of colon: Secondary | ICD-10-CM | POA: Diagnosis not present

## 2018-02-17 DIAGNOSIS — Z0000 Encounter for general adult medical examination without abnormal findings: Secondary | ICD-10-CM | POA: Diagnosis not present

## 2018-02-17 DIAGNOSIS — E559 Vitamin D deficiency, unspecified: Secondary | ICD-10-CM | POA: Diagnosis not present

## 2018-02-17 DIAGNOSIS — M810 Age-related osteoporosis without current pathological fracture: Secondary | ICD-10-CM | POA: Diagnosis not present

## 2018-02-17 DIAGNOSIS — E782 Mixed hyperlipidemia: Secondary | ICD-10-CM | POA: Diagnosis not present

## 2018-02-22 ENCOUNTER — Other Ambulatory Visit: Payer: Self-pay | Admitting: Family Medicine

## 2018-02-22 ENCOUNTER — Ambulatory Visit
Admission: RE | Admit: 2018-02-22 | Discharge: 2018-02-22 | Disposition: A | Discharge status: Home / Self Care | Payer: Medicare Other | Source: Ambulatory Visit | Attending: Family Medicine | Admitting: Family Medicine

## 2018-02-22 DIAGNOSIS — R059 Cough, unspecified: Secondary | ICD-10-CM

## 2018-02-22 DIAGNOSIS — R05 Cough: Secondary | ICD-10-CM

## 2018-03-01 ENCOUNTER — Other Ambulatory Visit: Payer: Self-pay | Admitting: Family Medicine

## 2018-03-01 DIAGNOSIS — R911 Solitary pulmonary nodule: Secondary | ICD-10-CM

## 2018-03-04 ENCOUNTER — Ambulatory Visit
Admission: RE | Admit: 2018-03-04 | Discharge: 2018-03-04 | Disposition: A | Discharge status: Home / Self Care | Payer: Medicare Other | Source: Ambulatory Visit | Attending: Family Medicine | Admitting: Family Medicine

## 2018-03-04 DIAGNOSIS — R911 Solitary pulmonary nodule: Secondary | ICD-10-CM

## 2018-03-04 DIAGNOSIS — R918 Other nonspecific abnormal finding of lung field: Secondary | ICD-10-CM | POA: Diagnosis not present

## 2018-03-17 DIAGNOSIS — K130 Diseases of lips: Secondary | ICD-10-CM | POA: Diagnosis not present

## 2018-03-21 DIAGNOSIS — Z1231 Encounter for screening mammogram for malignant neoplasm of breast: Secondary | ICD-10-CM | POA: Diagnosis not present

## 2018-07-14 ENCOUNTER — Other Ambulatory Visit (HOSPITAL_COMMUNITY): Payer: Self-pay | Admitting: Family Medicine

## 2018-07-14 ENCOUNTER — Other Ambulatory Visit: Payer: Self-pay | Admitting: Family Medicine

## 2018-07-14 DIAGNOSIS — R911 Solitary pulmonary nodule: Secondary | ICD-10-CM

## 2018-07-19 ENCOUNTER — Ambulatory Visit (HOSPITAL_COMMUNITY)
Admission: RE | Admit: 2018-07-19 | Discharge: 2018-07-19 | Disposition: A | Discharge status: Home / Self Care | Payer: Medicare Other | Source: Ambulatory Visit | Attending: Family Medicine | Admitting: Family Medicine

## 2018-07-19 ENCOUNTER — Other Ambulatory Visit: Payer: Self-pay

## 2018-07-19 DIAGNOSIS — R911 Solitary pulmonary nodule: Secondary | ICD-10-CM

## 2018-07-19 LAB — GLUCOSE, CAPILLARY: Glucose-Capillary: 97 mg/dL (ref 70–99)

## 2018-07-19 MED ORDER — FLUDEOXYGLUCOSE F - 18 (FDG) INJECTION
6.6600 | Freq: Once | INTRAVENOUS | Status: AC | PRN
  Administered 2018-07-19: 13:00:00 6.66 via INTRAVENOUS

## 2018-08-15 DIAGNOSIS — Q383 Other congenital malformations of tongue: Secondary | ICD-10-CM | POA: Diagnosis not present

## 2018-08-18 DIAGNOSIS — H34211 Partial retinal artery occlusion, right eye: Secondary | ICD-10-CM | POA: Diagnosis not present

## 2018-08-18 DIAGNOSIS — H5213 Myopia, bilateral: Secondary | ICD-10-CM | POA: Diagnosis not present

## 2018-08-18 DIAGNOSIS — H52221 Regular astigmatism, right eye: Secondary | ICD-10-CM | POA: Diagnosis not present

## 2018-08-18 DIAGNOSIS — H524 Presbyopia: Secondary | ICD-10-CM | POA: Diagnosis not present

## 2018-08-18 DIAGNOSIS — H35031 Hypertensive retinopathy, right eye: Secondary | ICD-10-CM | POA: Diagnosis not present

## 2018-08-24 ENCOUNTER — Institutional Professional Consult (permissible substitution): Payer: Medicare Other | Admitting: Internal Medicine

## 2018-09-05 ENCOUNTER — Ambulatory Visit: Payer: Medicare Other | Admitting: Internal Medicine

## 2018-09-05 ENCOUNTER — Other Ambulatory Visit: Payer: Self-pay

## 2018-09-05 ENCOUNTER — Encounter: Payer: Self-pay | Admitting: Internal Medicine

## 2018-09-05 DIAGNOSIS — Z23 Encounter for immunization: Secondary | ICD-10-CM

## 2018-09-05 DIAGNOSIS — R918 Other nonspecific abnormal finding of lung field: Secondary | ICD-10-CM | POA: Diagnosis not present

## 2018-09-05 DIAGNOSIS — F17210 Nicotine dependence, cigarettes, uncomplicated: Secondary | ICD-10-CM

## 2018-09-05 LAB — SEDIMENTATION RATE: Sed Rate: 8 mm/hr (ref 0–30)

## 2018-09-05 NOTE — Progress Notes (Signed)
Shelly [name], female    DOB: 1945-03-14     MRN: [id number]   Brief patient profile:  23 yowf active smoker with RUL nodule detected in 2010 and felt to be benign per Dr Janifer Adie last note in 08/2011  but f/u cxr for cough done 02/22/18 showing ? Same nodule viz on plain film, confirmed on ct 03/04/18 and Pos on PET so referred to pulmonary clinic 09/05/2018 by Dr   Orland Mustard.      History of Present Illness  09/05/2018  Pulmonary/ 1st office eval/Ravi Tuccillo  Chief Complaint  Patient presents with   Pulmonary Consult    Referred by Dr. Orland Mustard for eval of pulmonary nodule. Pt c/o prod cough x 1-2 years- clear sputum.   Dyspnea:  Limited by legs/back and not by sob / walks to clubhouse x 20 minutes/ fast walk, some hills  Cough: intermittent, assoc with vm rhinitis / clear mucus never  blood  Sleep: flat ok / one pillow SABA use:  None No cp, fever, NS   No obvious patterns in cough in terms of day to day or daytime variability or assoc excess/ purulent sputum or mucus plugs or hemoptysis or cp or chest tightness, subjective wheeze or overt sinus or hb symptoms.   Sleeping as above  without nocturnal  or early am exacerbation  of respiratory  c/o's or need for noct saba. Also denies any obvious fluctuation of symptoms with weather or environmental changes or other aggravating or alleviating factors except as outlined above   No unusual exposure hx or h/o childhood pna/ asthma or knowledge of premature birth.  Current Allergies, Complete Past Medical History, Past Surgical History, Family History, and Social History were reviewed in [employer] record.  ROS  The following are not active complaints unless bolded Hoarseness, sore throat, dysphagia, dental problems, itching, sneezing,  nasal congestion or discharge of excess mucus or purulent secretions, ear ache,   fever, chills, sweats, unintended wt loss or wt gain, classically pleuritic or exertional cp,  orthopnea pnd or  arm/hand swelling  or leg swelling, presyncope, palpitations, abdominal pain, anorexia, nausea, vomiting, diarrhea  or change in bowel habits or change in bladder habits, change in stools or change in urine, dysuria, hematuria,  rash, arthralgias, visual complaints, headache, numbness, weakness or ataxia or problems with walking or coordination,  change in mood or  memory.           Past Medical History:  Diagnosis Date   Allergic rhinitis    Arthritis    right hand   Hypotension    Transient gluten sensitivity    diarrhea and stomaCH pains    Outpatient Medications Prior to Visit  Medication Sig Dispense Refill   acetaminophen (TYLENOL) 500 MG tablet Take 1,000 mg by mouth every 6 (six) hours as needed for mild pain.     B Complex-C (B-COMPLEX WITH VITAMIN C) tablet Take 1 tablet by mouth daily.     calcium carbonate (OS-CAL) 600 MG TABS tablet Take 600 mg by mouth daily with breakfast.     cetirizine (ZYRTEC) 5 MG tablet Take 5 mg by mouth daily.     Cholecalciferol (VITAMIN D) 2000 UNITS tablet Take 2,000 Units by mouth daily.     diazepam (VALIUM) 5 MG tablet Take 1 by mouth 1 to 2 hours pre-procedure. May repeat if necessary. 2 tablet 0   ibuprofen (ADVIL,MOTRIN) 200 MG tablet Take 400 mg by mouth every 6 (six) hours as needed for  mild pain.     ipratropium (ATROVENT) 0.06 % nasal spray Place 2 sprays into the nose 4 (four) times daily as needed.     pseudoephedrine (SUDAFED) 30 MG tablet Take 30 mg by mouth every 4 (four) hours as needed. Pt takes 1/2 tablet prn        Objective:     BP 102/60 (BP Location: Left Arm, Cuff Size: Normal)    Pulse 77    Temp (!) 97.2 F (36.2 C) (Oral)    Ht 5' 4.5" (1.638 m)    Wt 131 lb (59.4 kg)    SpO2 96% Comment: on RA   BMI 22.14 kg/m   SpO2: 96 % RA   Pleasant amb wf nad  HEENT: nl dentition, turbinates bilaterally, and oropharynx. Nl external ear canals without cough reflex   NECK :  without JVD/Nodes/TM/ nl carotid  upstrokes bilaterally   LUNGS: no acc muscle use,  Nl contour chest which is clear to A and P bilaterally without cough on insp or exp maneuvers   CV:  RRR  no s3 or murmur or increase in P2, and no edema   ABD:  soft and nontender with nl inspiratory excursion in the supine position. No bruits or organomegaly appreciated, bowel sounds nl  MS:  Nl gait/ ext warm without deformities, calf tenderness, cyanosis or clubbing No obvious joint restrictions   SKIN: warm and dry without lesions    NEURO:  alert, approp, nl sensorium with  no motor or cerebellar deficits apparent.          I personally reviewed images and agree with radiology impression as follows:  PET CT 07/19/2018 1. The dominant right upper lobe nodule is hypermetabolic with maximum SUV of 6.0. This measured 0.6 by 0.4 cm back on 10/23/2008 and currently measures 1.5 by 0.9 cm. Slow growing neoplasm such as carcinoid tumor favored. No findings of metastatic spread. 2. Several other small pulmonary nodules are not hypermetabolic. Some of these have characteristics of small bronchoceles. 3. Asymmetry of the tongue base eccentric to the right on the CT data, with some slightly asymmetric right eccentric activity along the lingual tonsillar region, maximum SUV 7.0. Although quite likely benign, direct inspection of the lingual tonsils is suggested.>  [facility] eval neg 08/2018 4.  Aortic Atherosclerosis (ICD10-I70.0). 5. Small hypodense hepatic and renal lesions are not hypermetabolic and likely benign cysts or similar benign lesions. 6. Calcified uterine fibroid.   Labs ordered 09/05/2018 :   Esr, quant gold tb   Assessment   Pulmonary nodules Ct chest 10/2010:  RUL nodule 5.32m to 770m RML and RLL nodules unchanged (from 04/2009) - CT chest  03/04/18 Elongated 11 x 9 mm smoothly marginated and well circumscribed pulmonary nodule has increased in size since 2013 where it measured 8 x 6 mm. - PET CT 07/19/18  right  upper lobe nodule is hypermetabolic with maximum SUV of 6.0. This measured 0.6 by 0.4 cm back on 10/23/2008 and currently measures 1.5 by 0.9 cm. Slow growing neoplasm such as carcinoid tumor favored.> 09/05/2018 rec T surgery eval for VATS bx/ RULobectomy if needed and pfts favorable  - Quant Gold TB 09/05/2018 pending - ESR 09/05/2018 pending   Agree either carcinoid or well diff NSCL Ca or "scar carcinoma" in ddx and explained why attempting a bx with significant false neg results is problematic in this setting   >>>>> Best option in pt who appears to be good candidate for VATS and  up to  a RULobectomy if needed = excisional bx and RULobectomy if indicated by frozen intra-operatively.  Discussed in detail all the  indications, usual  risks and alternatives  relative to the benefits with patient who agrees to proceed with rx as outlined.       Cigarette smoker Counseled re importance of smoking cessation but did not meet time criteria for separate billing   >>> Advised on complete smoking cessation x 2 weeks preop     Total time devoted to counseling  > 50 % of initial 60 min office visit:  review case with pt/longterm live in bf,  discussion of options/alternatives/ personally creating written customized instructions  in presence of pt  then going over those specific  Instructions directly with the pt including how to use all of the meds but in particular covering each new medication in detail and the difference between the maintenance= "automatic" meds and the prns using an action plan format for the latter (If this problem/symptom => do that organization reading Left to right).  Please see AVS from this visit for a full list of these instructions which I personally wrote for this pt and  are unique to this visit.      Christinia Gully, MD 09/05/2018

## 2018-09-05 NOTE — Patient Instructions (Signed)
Please remember to go to the lab department   for your tests - we will call you with the results when they are available.     We will call you with referral to see Dr Roxan Hockey   Call me if you have any questions after you see Dr Roxan Hockey

## 2018-09-05 NOTE — Assessment & Plan Note (Signed)
Counseled re importance of smoking cessation but did not meet time criteria for separate billing    Advised on complete smoking cessation x 2 weeks preop   Total time devoted to counseling  > 50 % of initial 60 min office visit:  review case with pt/longterm live in bf,  discussion of options/alternatives/ personally creating written customized instructions  in presence of pt  then going over those specific  Instructions directly with the pt including how to use all of the meds but in particular covering each new medication in detail and the difference between the maintenance= "automatic" meds and the prns using an action plan format for the latter (If this problem/symptom => do that organization reading Left to right).  Please see AVS from this visit for a full list of these instructions which I personally wrote for this pt and  are unique to this visit.

## 2018-09-05 NOTE — Assessment & Plan Note (Addendum)
Ct chest 10/2010:  RUL nodule 5.58m to 754m RML and RLL nodules unchanged (from 04/2009) - CT chest  03/04/18 Elongated 11 x 9 mm smoothly marginated and well circumscribed pulmonary nodule has increased in size since 2013 where it measured 8 x 6 mm. - PET CT 07/19/18  right upper lobe nodule is hypermetabolic with maximum SUV of 6.0. This measured 0.6 by 0.4 cm back on 10/23/2008 and currently measures 1.5 by 0.9 cm. Slow growing neoplasm such as carcinoid tumor favored.> 09/05/2018 rec T surgery eval for VATS bx/ RULobectomy if needed and pfts favorable  - Quant Gold TB 09/05/2018 pending - ESR 09/05/2018 pending   Agree either carcinoid or well diff NSCL Ca or "scar carcinoma" in ddx and explained why attempting a bx with significant false neg results is problematic in this setting   Best option in pt who appears to be good candidate for VATS and  up to a RULobectomy if needed = excisional bx and RULobectomy if indicated by frozen intra-operatively.  Discussed in detail all the  indications, usual  risks and alternatives  relative to the benefits with patient who agrees to proceed with rx as outlined.

## 2018-09-08 LAB — QUANTIFERON-TB GOLD PLUS
Mitogen-NIL: >10 IU/mL
NIL: 0.04 IU/mL
QuantiFERON-TB Gold Plus: NEGATIVE
TB1-NIL: 0.05 IU/mL
TB2-NIL: 0.03 IU/mL

## 2018-09-09 NOTE — Progress Notes (Signed)
Spoke with pt and notified of results per Dr. Wert. Pt verbalized understanding and denied any questions. 

## 2018-09-14 ENCOUNTER — Other Ambulatory Visit: Payer: Self-pay | Admitting: Thoracic Surgery (Cardiothoracic Vascular Surgery)

## 2018-09-14 ENCOUNTER — Encounter: Payer: Medicare Other | Admitting: Thoracic Surgery (Cardiothoracic Vascular Surgery)

## 2018-09-14 ENCOUNTER — Other Ambulatory Visit: Payer: Self-pay

## 2018-09-14 ENCOUNTER — Other Ambulatory Visit: Payer: Self-pay | Admitting: *Deleted

## 2018-09-14 ENCOUNTER — Institutional Professional Consult (permissible substitution): Payer: Medicare Other | Admitting: Thoracic Surgery (Cardiothoracic Vascular Surgery)

## 2018-09-14 ENCOUNTER — Encounter: Payer: Self-pay | Admitting: Thoracic Surgery (Cardiothoracic Vascular Surgery)

## 2018-09-14 VITALS — BP 111/68 | HR 86 | Temp 97.7°F | Resp 16 | Ht 64.5 in | Wt 130.0 lb

## 2018-09-14 DIAGNOSIS — D381 Neoplasm of uncertain behavior of trachea, bronchus and lung: Secondary | ICD-10-CM | POA: Diagnosis not present

## 2018-09-14 DIAGNOSIS — R911 Solitary pulmonary nodule: Secondary | ICD-10-CM

## 2018-09-14 NOTE — Progress Notes (Signed)
PCP is London Pepper, MD Referring Provider is Tanda Rockers, MD  Chief Complaint  Patient presents with  . Lung Lesion    RUL.Marland KitchenMarland KitchenSurgical eval, PET Scan 07/19/18, Chest CT 03/04/18    HPI: [name] is sent for consultation regarding a right upper lobe lung nodule.  Shelly [name] is a 73 year old woman with past medical history significant for tobacco use (less than half a pack a day for 50 years), arthritis, gluten allergy, and alpha-gal.  She was found to have multiple lung nodules back in 2010.  These were followed radiographically until 2013 without any significant change in size.  She saw Dr. Orland Mustard in February 2020.  She complained of a cough.  A repeat CT was done to follow-up on the lung nodules and a nodule in the right upper lobe had increased in size while other nodules have remained stable.  Follow-up was recommended.  She had a follow-up with the PET CT in July.  That showed the right upper lobe nodule was hypermetabolic with an SUV of 6.  There were other nodules that were not metabolically active.  There was some activity at the base of her tongue.  That has been evaluated, and was not significant.  She is retired.  She remains fairly active.  Her exertion is mainly limited by arthritis and back pain.  She does not have shortness of breath with walking upstairs or walking for 45 minutes on level ground.  She does have a productive cough, no hemoptysis.  No significant weight change or change in appetite.  She thinks she may have gained a few pounds during the COVID isolation.  She denies any chest pain, pressure, or tightness with exertion.   Past Medical History:  Diagnosis Date  . Allergic rhinitis   . Arthritis    right hand  . Hypotension   . Transient gluten sensitivity    diarrhea and stomaCH pains    Past Surgical History:  Procedure Laterality Date  . EUS N/A 06/28/2013   Procedure: ESOPHAGEAL ENDOSCOPIC ULTRASOUND (EUS) RADIAL;  Surgeon: Arta Silence, MD;   Location: [facility];  Service: Endoscopy;  Laterality: N/A;  . GALLBLADDER SURGERY  2005  . TUBAL LIGATION  1981    Family History  Problem Relation Age of Onset  . Prostate cancer Father   . Emphysema Father     Social History Social History   Tobacco Use  . Smoking status: Current Every Day Smoker    Packs/day: 0.50    Years: 57.00    Pack years: 28.50    Types: Cigarettes  . Smokeless tobacco: Never Used  Substance Use Topics  . Alcohol use: No  . Drug use: No    Current Outpatient Medications  Medication Sig Dispense Refill  . acetaminophen (TYLENOL) 500 MG tablet Take 1,000 mg by mouth every 6 (six) hours as needed for mild pain.    . B Complex-C (B-COMPLEX WITH VITAMIN C) tablet Take 1 tablet by mouth daily.    . calcium carbonate (OS-CAL) 600 MG TABS tablet Take 600 mg by mouth daily with breakfast.    . cetirizine (ZYRTEC) 5 MG tablet Take 5 mg by mouth daily.    . Cholecalciferol (VITAMIN D) 2000 UNITS tablet Take 2,000 Units by mouth daily.    Marland Kitchen ibuprofen (ADVIL,MOTRIN) 200 MG tablet Take 400 mg by mouth every 6 (six) hours as needed for mild pain.    Marland Kitchen ipratropium (ATROVENT) 0.06 % nasal spray Place 2 sprays into the nose  4 (four) times daily as needed.    . pseudoephedrine (SUDAFED) 30 MG tablet Take 30 mg by mouth every 4 (four) hours as needed. Pt takes 1/2 tablet prn     No current facility-administered medications for this visit.     Allergies  Allergen Reactions  . Gelatin Anaphylaxis  . Clindamycin     REACTION: rash  . Gluten Diarrhea and Nausea Only  . Hyoscyamine Sulfate Nausea Only  . Cortisone Rash  . Penicillins Rash  . Prednisone Rash  . Sulfonamide Derivatives Rash    Review of Systems  Constitutional: Negative for activity change, appetite change and unexpected weight change.  HENT: Negative for trouble swallowing and voice change.   Respiratory: Positive for cough. Negative for shortness of breath and wheezing.   Cardiovascular:  Negative for chest pain and leg swelling.  Gastrointestinal: Negative for abdominal distention and abdominal pain.  Genitourinary: Negative for difficulty urinating and dysuria.  Musculoskeletal: Positive for arthralgias and back pain.  Neurological: Negative for seizures, syncope and weakness.  Hematological: Negative for adenopathy. Does not bruise/bleed easily.  All other systems reviewed and are negative.   BP 111/68 (BP Location: Right Arm, Patient Position: Sitting, Cuff Size: Normal)   Pulse 86   Temp 97.7 F (36.5 C)   Resp 16   Ht 5' 4.5" (1.638 m)   Wt 130 lb (59 kg)   SpO2 96% Comment: RA  BMI 21.97 kg/m  Physical Exam Vitals signs reviewed.  Constitutional:      General: She is not in acute distress.    Appearance: Normal appearance. She is not toxic-appearing.  HENT:     Head: Normocephalic and atraumatic.  Eyes:     General: No scleral icterus.    Extraocular Movements: Extraocular movements intact.     Pupils: Pupils are equal, round, and reactive to light.  Neck:     Musculoskeletal: No muscular tenderness.  Cardiovascular:     Rate and Rhythm: Normal rate and regular rhythm.     Heart sounds: Normal heart sounds. No murmur. No friction rub. No gallop.   Pulmonary:     Effort: No respiratory distress.     Breath sounds: Normal breath sounds. No wheezing or rales.  Abdominal:     General: There is no distension.     Palpations: Abdomen is soft.     Tenderness: There is no abdominal tenderness.  Musculoskeletal:        General: No swelling.  Lymphadenopathy:     Cervical: No cervical adenopathy.  Skin:    General: Skin is warm and dry.  Neurological:     General: No focal deficit present.     Mental Status: She is alert and oriented to person, place, and time.     Cranial Nerves: No cranial nerve deficit.     Motor: No weakness.     Gait: Gait normal.  Psychiatric:        Mood and Affect: Mood normal.    Diagnostic Tests: NUCLEAR MEDICINE PET  SKULL BASE TO THIGH  TECHNIQUE: 6.7 mCi F-18 FDG was injected intravenously. Full-ring PET imaging was performed from the skull base to thigh after the radiotracer. CT data was obtained and used for attenuation correction and anatomic localization.  Fasting blood glucose: 97 mg/dl  COMPARISON:  Chest CT 03/04/2018  FINDINGS: Mediastinal blood pool activity: SUV max 2.8  Liver activity: SUV max NA  NECK: Mild asymmetry of the tongue base eccentric to the right, along with some bilateral  tongue base/lingual tonsillar activity likewise minimally eccentric to right with maximum SUV 7.0.  Incidental CT findings: none  CHEST: 1.5 by 0.9 cm right upper lobe pulmonary nodule on image 24/8 has a maximum SUV of 6.0.  Indistinct clustered nodularity in the left upper lobe which has a branching morphology on image 26/8, maximum SUV 0.6, favoring bronchocele or postinflammatory lesion.  0.6 by 0.5 cm right middle lobe pulmonary nodule on image 42/8 appears roughly stable from prior, maximum SUV 1.7.  Branching right basilar pulmonary nodule is continuous from an airway and likely a bronchocele, measuring 0.7 by 0.7 cm on image 51/8, maximum SUV 1.2.  Mild lingular scarring without significant accentuated metabolic activity.  Incidental CT findings: Atherosclerotic calcification of the thoracic aorta.  ABDOMEN/PELVIS: No significant abnormal hypermetabolic activity in this region.  Incidental CT findings: Small hypodense single lesions in the lateral segment left hepatic lobe and in segment 8 of the liver are not hypermetabolic and are unchanged from prior chest CT, and most likely benign given the lack of hypermetabolic activity. Right renal cyst. Aortoiliac atherosclerotic vascular disease. Cholecystectomy. Calcification in the uterine fundus likely from a fibroid.  SKELETON: No significant abnormal hypermetabolic activity in this region.  Incidental CT  findings: none  IMPRESSION: 1. The dominant right upper lobe nodule is hypermetabolic with maximum SUV of 6.0. This measured 0.6 by 0.4 cm back on 10/23/2008 and currently measures 1.5 by 0.9 cm. Slow growing neoplasm such as carcinoid tumor favored. No findings of metastatic spread. 2. Several other small pulmonary nodules are not hypermetabolic. Some of these have characteristics of small bronchoceles. 3. Asymmetry of the tongue base eccentric to the right on the CT data, with some slightly asymmetric right eccentric activity along the lingual tonsillar region, maximum SUV 7.0. Although quite likely benign, direct inspection of the lingual tonsils is suggested. 4.  Aortic Atherosclerosis (ICD10-I70.0). 5. Small hypodense hepatic and renal lesions are not hypermetabolic and likely benign cysts or similar benign lesions. 6. Calcified uterine fibroid.   Electronically Signed   By: Van Clines M.D.   On: 07/19/2018 17:10 I personally reviewed the CT images and concur with the findings noted above  Impression: Shelly [name] is a 73 year old woman with a longstanding history of light tobacco use.  She has a history of multiple pulmonary nodules first noted on CT scan in 2010.  Those were followed for several years with no change and were felt to be benign.  She recently had a follow-up CT after 7 years and the right upper lobe nodule had increased in size from 6 x 8 mm to 9 x 11 mm.  A PET/CT done 5 months later showed the nodule increased in size and was now 9 x 15 mm.  It also was markedly hypermetabolic with an SUV of 6.  Overall the findings are consistent with a very slow-growing lesion, although there has been relatively little increase in rate of growth recently in significant hypermetabolic activity on PET/CT.  Differential diagnosis includes primary bronchogenic carcinoma, carcinoid tumor, other neoplasms, and infectious and inflammatory nodules.  Just based on CT imaging  carcinoid seem to be favored although it has a relatively high SUV on PET.  It is possible this is a more aggressive neoplasm originating in an old scar.  In any event a definitive diagnosis is needed.  We discussed potential options of biopsy versus surgical resection.  I favor surgical resection given the degree of activity on PET CT.  Both bronchoscopic and CT-guided  biopsies are subject to false negative readings and could not be relied on to definitively rule out cancer.  I recommended that we proceed with right VATS for wedge resection and possible right upper lobectomy depending on intraoperative frozen section findings.  I described the general nature of the procedure to the patient and her significant other.  They understand the need for general anesthesia, the incisions to be used, the intraoperative decision making, the use of a drainage tube postoperatively, the expected hospital stay, and the overall recovery.  I informed them of the indications, risk, benefits, and alternatives.  They understand the risks include, but are not limited to death, MI, DVT, PE, bleeding, possible need for transfusion, infection, prolonged air leak, cardiac arrhythmias, as well as the possibility of other unforeseeable complications.  She has concerns about diet due to her gluten allergy and her alpha-gal.  Those concerns are understandable we will work with dietary on those issues.  She accepts the risks and wishes to proceed.  Plan: Right VATS, wedge resection right upper lobe nodule, possible right upper lobectomy on Monday, 10/03/2018  Melrose Nakayama, MD [facility] [phone]

## 2018-09-14 NOTE — H&P (View-Only) (Signed)
PCP is London Pepper, MD Referring Provider is Tanda Rockers, MD  Chief Complaint  Patient presents with  . Lung Lesion    RUL.Marland KitchenMarland KitchenSurgical eval, PET Scan 07/19/18, Chest CT 03/04/18    HPI: Shelly [name] is sent for consultation regarding a right upper lobe lung nodule.  Shelly [name] is a 73 year old woman with past medical history significant for tobacco use (less than half a pack a day for 50 years), arthritis, gluten allergy, and alpha-gal.  Shelly [name] was found to have multiple lung nodules back in 2010.  These were followed radiographically until 2013 without any significant change in size.  Shelly [name] saw Dr. Orland Mustard in February 2020.  Shelly [name] complained of a cough.  A repeat CT was done to follow-up on the lung nodules and a nodule in the right upper lobe had increased in size while other nodules have remained stable.  Follow-up was recommended.  Shelly [name] had a follow-up with the PET CT in July.  That showed the right upper lobe nodule was hypermetabolic with an SUV of 6.  There were other nodules that were not metabolically active.  There was some activity at the base of Shelly [name] tongue.  That has been evaluated, and was not significant.  Shelly [name] is retired.  Shelly [name] remains fairly active.  Shelly [name] exertion is mainly limited by arthritis and back pain.  Shelly [name] does not have shortness of breath with walking upstairs or walking for 45 minutes on level ground.  Shelly [name] does have a productive cough, no hemoptysis.  No significant weight change or change in appetite.  Shelly [name] thinks Shelly [name] may have gained a few pounds during the COVID isolation.  Shelly [name] denies any chest pain, pressure, or tightness with exertion.   Past Medical History:  Diagnosis Date  . Allergic rhinitis   . Arthritis    right hand  . Hypotension   . Transient gluten sensitivity    diarrhea and stomaCH pains    Past Surgical History:  Procedure Laterality Date  . EUS N/A 06/28/2013   Procedure: ESOPHAGEAL ENDOSCOPIC ULTRASOUND (EUS) RADIAL;  Surgeon: Arta Silence, MD;   Location: [facility];  Service: Endoscopy;  Laterality: N/A;  . GALLBLADDER SURGERY  2005  . TUBAL LIGATION  1981    Family History  Problem Relation Age of Onset  . Prostate cancer Father   . Emphysema Father     Social History Social History   Tobacco Use  . Smoking status: Current Every Day Smoker    Packs/day: 0.50    Years: 57.00    Pack years: 28.50    Types: Cigarettes  . Smokeless tobacco: Never Used  Substance Use Topics  . Alcohol use: No  . Drug use: No    Current Outpatient Medications  Medication Sig Dispense Refill  . acetaminophen (TYLENOL) 500 MG tablet Take 1,000 mg by mouth every 6 (six) hours as needed for mild pain.    . B Complex-C (B-COMPLEX WITH VITAMIN C) tablet Take 1 tablet by mouth daily.    . calcium carbonate (OS-CAL) 600 MG TABS tablet Take 600 mg by mouth daily with breakfast.    . cetirizine (ZYRTEC) 5 MG tablet Take 5 mg by mouth daily.    . Cholecalciferol (VITAMIN D) 2000 UNITS tablet Take 2,000 Units by mouth daily.    Marland Kitchen ibuprofen (ADVIL,MOTRIN) 200 MG tablet Take 400 mg by mouth every 6 (six) hours as needed for mild pain.    Marland Kitchen ipratropium (ATROVENT) 0.06 % nasal spray Place 2 sprays into the nose  4 (four) times daily as needed.    . pseudoephedrine (SUDAFED) 30 MG tablet Take 30 mg by mouth every 4 (four) hours as needed. Pt takes 1/2 tablet prn     No current facility-administered medications for this visit.     Allergies  Allergen Reactions  . Gelatin Anaphylaxis  . Clindamycin     REACTION: rash  . Gluten Diarrhea and Nausea Only  . Hyoscyamine Sulfate Nausea Only  . Cortisone Rash  . Penicillins Rash  . Prednisone Rash  . Sulfonamide Derivatives Rash    Review of Systems  Constitutional: Negative for activity change, appetite change and unexpected weight change.  HENT: Negative for trouble swallowing and voice change.   Respiratory: Positive for cough. Negative for shortness of breath and wheezing.   Cardiovascular:  Negative for chest pain and leg swelling.  Gastrointestinal: Negative for abdominal distention and abdominal pain.  Genitourinary: Negative for difficulty urinating and dysuria.  Musculoskeletal: Positive for arthralgias and back pain.  Neurological: Negative for seizures, syncope and weakness.  Hematological: Negative for adenopathy. Does not bruise/bleed easily.  All other systems reviewed and are negative.   BP 111/68 (BP Location: Right Arm, Patient Position: Sitting, Cuff Size: Normal)   Pulse 86   Temp 97.7 F (36.5 C)   Resp 16   Ht 5' 4.5" (1.638 m)   Wt 130 lb (59 kg)   SpO2 96% Comment: RA  BMI 21.97 kg/m  Physical Exam Vitals signs reviewed.  Constitutional:      General: Shelly [name] is not in acute distress.    Appearance: Normal appearance. Shelly [name] is not toxic-appearing.  HENT:     Head: Normocephalic and atraumatic.  Eyes:     General: No scleral icterus.    Extraocular Movements: Extraocular movements intact.     Pupils: Pupils are equal, round, and reactive to light.  Neck:     Musculoskeletal: No muscular tenderness.  Cardiovascular:     Rate and Rhythm: Normal rate and regular rhythm.     Heart sounds: Normal heart sounds. No murmur. No friction rub. No gallop.   Pulmonary:     Effort: No respiratory distress.     Breath sounds: Normal breath sounds. No wheezing or rales.  Abdominal:     General: There is no distension.     Palpations: Abdomen is soft.     Tenderness: There is no abdominal tenderness.  Musculoskeletal:        General: No swelling.  Lymphadenopathy:     Cervical: No cervical adenopathy.  Skin:    General: Skin is warm and dry.  Neurological:     General: No focal deficit present.     Mental Status: Shelly [name] is alert and oriented to person, place, and time.     Cranial Nerves: No cranial nerve deficit.     Motor: No weakness.     Gait: Gait normal.  Psychiatric:        Mood and Affect: Mood normal.    Diagnostic Tests: NUCLEAR MEDICINE PET  SKULL BASE TO THIGH  TECHNIQUE: 6.7 mCi F-18 FDG was injected intravenously. Full-ring PET imaging was performed from the skull base to thigh after the radiotracer. CT data was obtained and used for attenuation correction and anatomic localization.  Fasting blood glucose: 97 mg/dl  COMPARISON:  Chest CT 03/04/2018  FINDINGS: Mediastinal blood pool activity: SUV max 2.8  Liver activity: SUV max NA  NECK: Mild asymmetry of the tongue base eccentric to the right, along with some bilateral  tongue base/lingual tonsillar activity likewise minimally eccentric to right with maximum SUV 7.0.  Incidental CT findings: none  CHEST: 1.5 by 0.9 cm right upper lobe pulmonary nodule on image 24/8 has a maximum SUV of 6.0.  Indistinct clustered nodularity in the left upper lobe which has a branching morphology on image 26/8, maximum SUV 0.6, favoring bronchocele or postinflammatory lesion.  0.6 by 0.5 cm right middle lobe pulmonary nodule on image 42/8 appears roughly stable from prior, maximum SUV 1.7.  Branching right basilar pulmonary nodule is continuous from an airway and likely a bronchocele, measuring 0.7 by 0.7 cm on image 51/8, maximum SUV 1.2.  Mild lingular scarring without significant accentuated metabolic activity.  Incidental CT findings: Atherosclerotic calcification of the thoracic aorta.  ABDOMEN/PELVIS: No significant abnormal hypermetabolic activity in this region.  Incidental CT findings: Small hypodense single lesions in the lateral segment left hepatic lobe and in segment 8 of the liver are not hypermetabolic and are unchanged from prior chest CT, and most likely benign given the lack of hypermetabolic activity. Right renal cyst. Aortoiliac atherosclerotic vascular disease. Cholecystectomy. Calcification in the uterine fundus likely from a fibroid.  SKELETON: No significant abnormal hypermetabolic activity in this region.  Incidental CT  findings: none  IMPRESSION: 1. The dominant right upper lobe nodule is hypermetabolic with maximum SUV of 6.0. This measured 0.6 by 0.4 cm back on 10/23/2008 and currently measures 1.5 by 0.9 cm. Slow growing neoplasm such as carcinoid tumor favored. No findings of metastatic spread. 2. Several other small pulmonary nodules are not hypermetabolic. Some of these have characteristics of small bronchoceles. 3. Asymmetry of the tongue base eccentric to the right on the CT data, with some slightly asymmetric right eccentric activity along the lingual tonsillar region, maximum SUV 7.0. Although quite likely benign, direct inspection of the lingual tonsils is suggested. 4.  Aortic Atherosclerosis (ICD10-I70.0). 5. Small hypodense hepatic and renal lesions are not hypermetabolic and likely benign cysts or similar benign lesions. 6. Calcified uterine fibroid.   Electronically Signed   By: Van Clines M.D.   On: 07/19/2018 17:10 I personally reviewed the CT images and concur with the findings noted above  Impression: Shelly [name] is a 73 year old woman with a longstanding history of light tobacco use.  Shelly [name] has a history of multiple pulmonary nodules first noted on CT scan in 2010.  Those were followed for several years with no change and were felt to be benign.  Shelly [name] recently had a follow-up CT after 7 years and the right upper lobe nodule had increased in size from 6 x 8 mm to 9 x 11 mm.  A PET/CT done 5 months later showed the nodule increased in size and was now 9 x 15 mm.  It also was markedly hypermetabolic with an SUV of 6.  Overall the findings are consistent with a very slow-growing lesion, although there has been relatively little increase in rate of growth recently in significant hypermetabolic activity on PET/CT.  Differential diagnosis includes primary bronchogenic carcinoma, carcinoid tumor, other neoplasms, and infectious and inflammatory nodules.  Just based on CT imaging  carcinoid seem to be favored although it has a relatively high SUV on PET.  It is possible this is a more aggressive neoplasm originating in an old scar.  In any event a definitive diagnosis is needed.  We discussed potential options of biopsy versus surgical resection.  I favor surgical resection given the degree of activity on PET CT.  Both bronchoscopic and CT-guided  biopsies are subject to false negative readings and could not be relied on to definitively rule out cancer.  I recommended that we proceed with right VATS for wedge resection and possible right upper lobectomy depending on intraoperative frozen section findings.  I described the general nature of the procedure to the patient and Shelly [name] significant other.  They understand the need for general anesthesia, the incisions to be used, the intraoperative decision making, the use of a drainage tube postoperatively, the expected hospital stay, and the overall recovery.  I informed them of the indications, risk, benefits, and alternatives.  They understand the risks include, but are not limited to death, MI, DVT, PE, bleeding, possible need for transfusion, infection, prolonged air leak, cardiac arrhythmias, as well as the possibility of other unforeseeable complications.  Shelly [name] has concerns about diet due to Shelly [name] gluten allergy and Shelly [name] alpha-gal.  Those concerns are understandable we will work with dietary on those issues.  Shelly [name] accepts the risks and wishes to proceed.  Plan: Right VATS, wedge resection right upper lobe nodule, possible right upper lobectomy on Monday, 10/03/2018  Melrose Nakayama, MD [facility] [phone]

## 2018-09-15 ENCOUNTER — Encounter: Payer: Self-pay | Admitting: *Deleted

## 2018-09-15 ENCOUNTER — Other Ambulatory Visit: Payer: Self-pay | Admitting: *Deleted

## 2018-09-15 DIAGNOSIS — R911 Solitary pulmonary nodule: Secondary | ICD-10-CM

## 2018-09-26 ENCOUNTER — Other Ambulatory Visit (HOSPITAL_COMMUNITY)
Admission: RE | Admit: 2018-09-26 | Discharge: 2018-09-26 | Disposition: A | Discharge status: Home / Self Care | Payer: Medicare Other | Source: Ambulatory Visit | Attending: Thoracic Surgery (Cardiothoracic Vascular Surgery) | Admitting: Thoracic Surgery (Cardiothoracic Vascular Surgery)

## 2018-09-26 DIAGNOSIS — Z20828 Contact with and (suspected) exposure to other viral communicable diseases: Secondary | ICD-10-CM | POA: Diagnosis not present

## 2018-09-26 DIAGNOSIS — Z01812 Encounter for preprocedural laboratory examination: Secondary | ICD-10-CM | POA: Diagnosis not present

## 2018-09-26 DIAGNOSIS — R911 Solitary pulmonary nodule: Secondary | ICD-10-CM | POA: Diagnosis not present

## 2018-09-28 LAB — NOVEL CORONAVIRUS, NAA (HOSP ORDER, SEND-OUT TO REF LAB; TAT 18-24 HRS): SARS-CoV-2, NAA: NOT DETECTED

## 2018-09-29 ENCOUNTER — Encounter (HOSPITAL_COMMUNITY)
Admission: RE | Admit: 2018-09-29 | Discharge: 2018-09-29 | Disposition: A | Discharge status: Home / Self Care | Payer: Medicare Other | Source: Ambulatory Visit | Attending: Thoracic Surgery (Cardiothoracic Vascular Surgery) | Admitting: Thoracic Surgery (Cardiothoracic Vascular Surgery)

## 2018-09-29 ENCOUNTER — Encounter (HOSPITAL_COMMUNITY): Payer: Self-pay

## 2018-09-29 ENCOUNTER — Ambulatory Visit (HOSPITAL_COMMUNITY)
Admission: RE | Admit: 2018-09-29 | Discharge: 2018-09-29 | Disposition: A | Discharge status: Home / Self Care | Payer: Medicare Other | Source: Ambulatory Visit | Attending: Thoracic Surgery (Cardiothoracic Vascular Surgery) | Admitting: Thoracic Surgery (Cardiothoracic Vascular Surgery)

## 2018-09-29 ENCOUNTER — Other Ambulatory Visit: Payer: Self-pay

## 2018-09-29 ENCOUNTER — Other Ambulatory Visit (HOSPITAL_COMMUNITY)
Admission: RE | Admit: 2018-09-29 | Discharge: 2018-09-29 | Disposition: A | Discharge status: Home / Self Care | Payer: Medicare Other | Source: Ambulatory Visit | Attending: Thoracic Surgery (Cardiothoracic Vascular Surgery) | Admitting: Thoracic Surgery (Cardiothoracic Vascular Surgery)

## 2018-09-29 DIAGNOSIS — Z01818 Encounter for other preprocedural examination: Secondary | ICD-10-CM | POA: Diagnosis not present

## 2018-09-29 DIAGNOSIS — R911 Solitary pulmonary nodule: Secondary | ICD-10-CM | POA: Diagnosis not present

## 2018-09-29 DIAGNOSIS — D381 Neoplasm of uncertain behavior of trachea, bronchus and lung: Secondary | ICD-10-CM | POA: Insufficient documentation

## 2018-09-29 DIAGNOSIS — Z20828 Contact with and (suspected) exposure to other viral communicable diseases: Secondary | ICD-10-CM | POA: Insufficient documentation

## 2018-09-29 LAB — COMPREHENSIVE METABOLIC PANEL
ALT: 13 U/L (ref 0–44)
AST: 21 U/L (ref 15–41)
Albumin: 4 g/dL (ref 3.5–5.0)
Alkaline Phosphatase: 64 U/L (ref 38–126)
Anion gap: 9 (ref 5–15)
BUN: 10 mg/dL (ref 8–23)
CO2: 24 mmol/L (ref 22–32)
Calcium: 8.9 mg/dL (ref 8.9–10.3)
Chloride: 107 mmol/L (ref 98–111)
Creatinine, Ser: 0.73 mg/dL (ref 0.44–1.00)
GFR calc Af Amer: >60 mL/min (ref >60)
GFR calc non Af Amer: >60 mL/min (ref >60)
Glucose, Bld: 95 mg/dL (ref 70–99)
Potassium: 4.1 mmol/L (ref 3.5–5.1)
Sodium: 140 mmol/L (ref 135–145)
Total Bilirubin: 0.7 mg/dL (ref 0.3–1.2)
Total Protein: 6.7 g/dL (ref 6.5–8.1)

## 2018-09-29 LAB — URINALYSIS, ROUTINE W REFLEX MICROSCOPIC
Bilirubin Urine: NEGATIVE
Glucose, UA: NEGATIVE mg/dL
Hgb urine dipstick: NEGATIVE
Ketones, ur: NEGATIVE mg/dL
Leukocytes,Ua: NEGATIVE
Nitrite: NEGATIVE
Protein, ur: NEGATIVE mg/dL
Specific Gravity, Urine: 1.008 (ref 1.005–1.030)
pH: 6 (ref 5.0–8.0)

## 2018-09-29 LAB — ABO/RH: ABO/RH(D): O POS

## 2018-09-29 LAB — BLOOD GAS, ARTERIAL
Acid-Base Excess: 1.8 mmol/L (ref 0.0–2.0)
Bicarbonate: 25.8 mmol/L (ref 20.0–28.0)
FIO2: 21
O2 Saturation: 96.4 %
Patient temperature: 98.6
pCO2 arterial: 40.5 mmHg (ref 32.0–48.0)
pH, Arterial: 7.421 (ref 7.350–7.450)
pO2, Arterial: 82.3 mmHg — ABNORMAL LOW (ref 83.0–108.0)

## 2018-09-29 LAB — PULMONARY FUNCTION TEST
DL/VA % pred: 79 %
DL/VA: 3.28 ml/min/mmHg/L
DLCO cor % pred: 72 %
DLCO cor: 14.29 ml/min/mmHg
DLCO unc % pred: 70 %
DLCO unc: 13.83 ml/min/mmHg
FEF 25-75 Post: 1.57 L/sec
FEF 25-75 Pre: 1.34 L/sec
FEF2575-%Change-Post: 17 %
FEF2575-%Pred-Post: 85 %
FEF2575-%Pred-Pre: 73 %
FEV1-%Change-Post: 4 %
FEV1-%Pred-Post: 92 %
FEV1-%Pred-Pre: 88 %
FEV1-Post: 2.08 L
FEV1-Pre: 1.99 L
FEV1FVC-%Change-Post: -4 %
FEV1FVC-%Pred-Pre: 94 %
FEV6-%Change-Post: 8 %
FEV6-%Pred-Post: 105 %
FEV6-%Pred-Pre: 97 %
FEV6-Post: 3.01 L
FEV6-Pre: 2.78 L
FEV6FVC-%Change-Post: 0 %
FEV6FVC-%Pred-Post: 103 %
FEV6FVC-%Pred-Pre: 104 %
FVC-%Change-Post: 9 %
FVC-%Pred-Post: 101 %
FVC-%Pred-Pre: 93 %
FVC-Post: 3.03 L
FVC-Pre: 2.78 L
Post FEV1/FVC ratio: 69 %
Post FEV6/FVC ratio: 99 %
Pre FEV1/FVC ratio: 72 %
Pre FEV6/FVC Ratio: 100 %
RV % pred: 102 %
RV: 2.31 L
TLC % pred: 103 %
TLC: 5.29 L

## 2018-09-29 LAB — TYPE AND SCREEN
ABO/RH(D): O POS
Antibody Screen: NEGATIVE

## 2018-09-29 LAB — CBC
HCT: 38.7 % (ref 36.0–46.0)
Hemoglobin: 12.4 g/dL (ref 12.0–15.0)
MCH: 30.6 pg (ref 26.0–34.0)
MCHC: 32 g/dL (ref 30.0–36.0)
MCV: 95.6 fL (ref 80.0–100.0)
Platelets: 329 10*3/uL (ref 150–400)
RBC: 4.05 MIL/uL (ref 3.87–5.11)
RDW: 12.5 % (ref 11.5–15.5)
WBC: 6.7 10*3/uL (ref 4.0–10.5)
nRBC: 0 % (ref 0.0–0.2)

## 2018-09-29 LAB — PROTIME-INR
INR: 1 (ref 0.8–1.2)
Prothrombin Time: 12.8 seconds (ref 11.4–15.2)

## 2018-09-29 LAB — APTT: aPTT: 33 seconds (ref 24–36)

## 2018-09-29 LAB — SURGICAL PCR SCREEN
MRSA, PCR: NEGATIVE
Staphylococcus aureus: NEGATIVE

## 2018-09-29 MED ORDER — ALBUTEROL SULFATE (2.5 MG/3ML) 0.083% IN NEBU
2.5000 mg | INHALATION_SOLUTION | Freq: Once | RESPIRATORY_TRACT | Status: AC
  Administered 2018-09-29: 2.5 mg via RESPIRATORY_TRACT

## 2018-09-29 NOTE — Progress Notes (Signed)
  Coronavirus Screening Got tested today Have you experienced the following symptoms:  Cough yes/no: No Fever (>100.34F)  yes/no: No Runny nose yes/no: No Sore throat yes/no: No Difficulty breathing/shortness of breath  yes/no: No Loss of smell or taste-No Have you or a family member traveled in the last 14 days and where? yes/no: No  PCP - Dr London Pepper  Cardiologist - denies  Pulmnolgy-Dr  Christinia Gully  Chest x-ray - DOS  EKG - Today  Stress Test - denies  ECHO - denies  Cardiac Cath - denies  AICD-denies PM-denies LOOP-denies  Sleep Study -denies  CPAP - NA  LABS-PCR,APTT,ABG,CBC,CMP,PT-INR,UA  ASA-denies  ERAS-NA  HA1C-denies Fasting Blood Sugar -  Checks Blood Sugar _____ times a day  Anesthesia-N  Pt denies having chest pain, sob, or fever at this time. All instructions explained to the pt, with a verbal understanding of the material. Pt agrees to go over the instructions while at home for a better understanding. Pt also instructed to self quarantine after being tested for COVID-19. The opportunity to ask questions was provided. ^

## 2018-09-29 NOTE — Pre-Procedure Instructions (Signed)
[facility] [address] [place] [address] Phone: [phone] Fax: [phone]      Your procedure is scheduled on  Monday 10-03-18 from 0730-1017  Report to [facility] Main Entrance "A" at 0530A.M., and check in at the Admitting office.  Call this number if you have problems the morning of surgery:  [phone]  Call [phone] if you have any questions prior to your surgery date Monday-Friday 8am-4pm    Remember:  Do not eat or drink after midnight the night before your surgery  Take these medicines the morning of surgery with A SIP OF WATER: As needed  pseudoephedrine (SUDAFED)  ipratropium (ATROVENT)  cetirizine (ZYRTEC)  acetaminophen (TYLENOL)            7 days prior to surgery STOP taking any Aspirin (unless otherwise instructed by your surgeon), Aleve, Naproxen, Ibuprofen, Motrin, Advil, Goody's, BC's, all herbal medications, fish oil, and all vitamins.    The Morning of Surgery  Do not wear jewelry, make-up or nail polish.  Do not wear lotions, powders, or perfumes, or deodorant  Do not shave 48 hours prior to surgery.  Men may shave face and neck.  Do not bring valuables to the hospital.  [facility] is not responsible for any belongings or valuables.  If you are a smoker, DO NOT Smoke 24 hours prior to surgery IF you wear a CPAP at night please bring your mask, tubing, and machine the morning of surgery   Remember that you must have someone to transport you home after your surgery, and remain with you for 24 hours if you are discharged the same day.   Contacts, glasses, hearing aids, dentures or bridgework may not be worn into surgery.    Leave your suitcase in the car.  After surgery it may be brought to your room.  For patients admitted to the hospital, discharge time will be determined by your treatment team.  Patients discharged the day of  surgery will not be allowed to drive home.    Special instructions:   [facility]- Preparing For Surgery  Before surgery, you can play an important role. Because skin is not sterile, your skin needs to be as free of germs as possible. You can reduce the number of germs on your skin by washing with CHG (chlorahexidine gluconate) Soap before surgery.  CHG is an antiseptic cleaner which kills germs and bonds with the skin to continue killing germs even after washing.    Oral Hygiene is also important to reduce your risk of infection.  Remember - BRUSH YOUR TEETH THE MORNING OF SURGERY WITH YOUR REGULAR TOOTHPASTE  Please do not use if you have an allergy to CHG or antibacterial soaps. If your skin becomes reddened/irritated stop using the CHG.  Do not shave (including legs and underarms) for at least 48 hours prior to first CHG shower. It is OK to shave your face.  Please follow these instructions carefully.   1. Shower the NIGHT BEFORE SURGERY and the MORNING OF SURGERY with CHG Soap.   2. If you chose to wash your hair, wash your hair first as usual with your normal shampoo.  3. After you shampoo, rinse your hair and body thoroughly to remove the shampoo.  4. Use CHG as you would any other liquid soap. You can apply CHG directly to the skin and wash gently with a scrungie or  a clean washcloth.   5. Apply the CHG Soap to your body ONLY FROM THE NECK DOWN.  Do not use on open wounds or open sores. Avoid contact with your eyes, ears, mouth and genitals (private parts). Wash Face and genitals (private parts)  with your normal soap.   6. Wash thoroughly, paying special attention to the area where your surgery will be performed.  7. Thoroughly rinse your body with warm water from the neck down.  8. DO NOT shower/wash with your normal soap after using and rinsing off the CHG Soap.  9. Pat yourself dry with a CLEAN TOWEL.  10. Wear CLEAN PAJAMAS to bed the night before surgery, wear  comfortable clothes the morning of surgery  11. Place CLEAN SHEETS on your bed the night of your first shower and DO NOT SLEEP WITH PETS.    Day of Surgery:  Do not apply any deodorants/lotions. Please shower the morning of surgery with the CHG soap  Please wear clean clothes to the hospital/surgery center.   Remember to brush your teeth WITH YOUR REGULAR TOOTHPASTE.   Please read over thefact sheets that you were given.

## 2018-09-30 LAB — NOVEL CORONAVIRUS, NAA (HOSP ORDER, SEND-OUT TO REF LAB; TAT 18-24 HRS): SARS-CoV-2, NAA: NOT DETECTED

## 2018-10-02 NOTE — Anesthesia Preprocedure Evaluation (Addendum)
Anesthesia Evaluation  Patient identified by MRN, date of birth, ID band Patient awake    Reviewed: Allergy & Precautions, NPO status , Patient's Chart, lab work & pertinent test results  Airway Mallampati: II  TM Distance: >3 FB Neck ROM: Full    Dental no notable dental hx. (+) Teeth Intact, Dental Advisory Given   Pulmonary COPD,  COPD inhaler, Current Smoker and Patient abstained from smoking.,  PFTs 09/2018 FEV1 88% predicted, FEV1/FVC 94% predicted   Pulmonary exam normal breath sounds clear to auscultation       Cardiovascular negative cardio ROS Normal cardiovascular exam Rhythm:Regular Rate:Normal     Neuro/Psych PSYCHIATRIC DISORDERS Anxiety Depression negative neurological ROS     GI/Hepatic negative GI ROS, Neg liver ROS,   Endo/Other  negative endocrine ROS  Renal/GU negative Renal ROS  negative genitourinary   Musculoskeletal  (+) Arthritis ,   Abdominal   Peds  Hematology negative hematology ROS (+)   Anesthesia Other Findings RUL nodule  Reproductive/Obstetrics                         Anesthesia Physical Anesthesia Plan  ASA: II  Anesthesia Plan: General   Post-op Pain Management:    Induction: Intravenous  PONV Risk Score and Plan: 2 and Dexamethasone, Ondansetron and Treatment may vary due to age or medical condition  Airway Management Planned: Double Lumen EBT  Additional Equipment: Arterial line  Intra-op Plan:   Post-operative Plan: Extubation in OR and Possible Post-op intubation/ventilation  Informed Consent: I have reviewed the patients History and Physical, chart, labs and discussed the procedure including the risks, benefits and alternatives for the proposed anesthesia with the patient or authorized representative who has indicated his/her understanding and acceptance.     Dental advisory given  Plan Discussed with: CRNA  Anesthesia Plan Comments:  (Possible central line)        Anesthesia Quick Evaluation

## 2018-10-03 ENCOUNTER — Inpatient Hospital Stay (HOSPITAL_COMMUNITY)
Admission: RE | Admit: 2018-10-03 | Discharge: 2018-10-07 | DRG: 164 | Disposition: A | Discharge status: Home / Self Care | Payer: Medicare Other | Attending: Thoracic Surgery (Cardiothoracic Vascular Surgery) | Admitting: Thoracic Surgery (Cardiothoracic Vascular Surgery)

## 2018-10-03 ENCOUNTER — Inpatient Hospital Stay (HOSPITAL_COMMUNITY): Payer: Medicare Other

## 2018-10-03 ENCOUNTER — Inpatient Hospital Stay (HOSPITAL_COMMUNITY): Payer: Medicare Other | Admitting: Certified Registered"

## 2018-10-03 ENCOUNTER — Encounter (HOSPITAL_COMMUNITY)
Admission: RE | Disposition: A | Discharge status: Home / Self Care | Payer: Self-pay | Source: Home / Self Care | Attending: Thoracic Surgery (Cardiothoracic Vascular Surgery)

## 2018-10-03 ENCOUNTER — Encounter (HOSPITAL_COMMUNITY): Payer: Self-pay

## 2018-10-03 ENCOUNTER — Other Ambulatory Visit: Payer: Self-pay

## 2018-10-03 DIAGNOSIS — R918 Other nonspecific abnormal finding of lung field: Secondary | ICD-10-CM | POA: Diagnosis not present

## 2018-10-03 DIAGNOSIS — M1990 Unspecified osteoarthritis, unspecified site: Secondary | ICD-10-CM | POA: Diagnosis present

## 2018-10-03 DIAGNOSIS — R911 Solitary pulmonary nodule: Secondary | ICD-10-CM | POA: Diagnosis present

## 2018-10-03 DIAGNOSIS — F17210 Nicotine dependence, cigarettes, uncomplicated: Secondary | ICD-10-CM | POA: Diagnosis not present

## 2018-10-03 DIAGNOSIS — D3A090 Benign carcinoid tumor of the bronchus and lung: Principal | ICD-10-CM | POA: Diagnosis present

## 2018-10-03 DIAGNOSIS — Z01818 Encounter for other preprocedural examination: Secondary | ICD-10-CM | POA: Diagnosis not present

## 2018-10-03 DIAGNOSIS — Z4682 Encounter for fitting and adjustment of non-vascular catheter: Secondary | ICD-10-CM | POA: Diagnosis not present

## 2018-10-03 DIAGNOSIS — F418 Other specified anxiety disorders: Secondary | ICD-10-CM | POA: Diagnosis not present

## 2018-10-03 DIAGNOSIS — Z880 Allergy status to penicillin: Secondary | ICD-10-CM | POA: Diagnosis not present

## 2018-10-03 DIAGNOSIS — Z882 Allergy status to sulfonamides status: Secondary | ICD-10-CM | POA: Diagnosis not present

## 2018-10-03 DIAGNOSIS — J939 Pneumothorax, unspecified: Secondary | ICD-10-CM | POA: Diagnosis not present

## 2018-10-03 DIAGNOSIS — D62 Acute posthemorrhagic anemia: Secondary | ICD-10-CM | POA: Diagnosis not present

## 2018-10-03 DIAGNOSIS — J309 Allergic rhinitis, unspecified: Secondary | ICD-10-CM | POA: Diagnosis not present

## 2018-10-03 DIAGNOSIS — J449 Chronic obstructive pulmonary disease, unspecified: Secondary | ICD-10-CM | POA: Diagnosis not present

## 2018-10-03 DIAGNOSIS — J984 Other disorders of lung: Secondary | ICD-10-CM | POA: Diagnosis not present

## 2018-10-03 DIAGNOSIS — Z8042 Family history of malignant neoplasm of prostate: Secondary | ICD-10-CM | POA: Diagnosis not present

## 2018-10-03 DIAGNOSIS — Z825 Family history of asthma and other chronic lower respiratory diseases: Secondary | ICD-10-CM

## 2018-10-03 DIAGNOSIS — Z888 Allergy status to other drugs, medicaments and biological substances status: Secondary | ICD-10-CM | POA: Diagnosis not present

## 2018-10-03 HISTORY — PX: VIDEO ASSISTED THORACOSCOPY (VATS)/WEDGE RESECTION: SHX6174

## 2018-10-03 SURGERY — VIDEO ASSISTED THORACOSCOPY (VATS)/WEDGE RESECTION
Anesthesia: General | Site: Chest | Laterality: Right

## 2018-10-03 MED ORDER — LACTATED RINGERS IV SOLN
INTRAVENOUS | Status: DC
  Administered 2018-10-03: 07:00:00 via INTRAVENOUS

## 2018-10-03 MED ORDER — SENNOSIDES-DOCUSATE SODIUM 8.6-50 MG PO TABS
1.0000 | ORAL_TABLET | Freq: Every day | ORAL | Status: DC
  Administered 2018-10-04: 1 via ORAL
  Filled 2018-10-03 (×2): qty 1

## 2018-10-03 MED ORDER — SODIUM CHLORIDE (PF) 0.9 % IJ SOLN
INTRAMUSCULAR | Status: DC | PRN
  Administered 2018-10-03: 50 mL

## 2018-10-03 MED ORDER — PHENYLEPHRINE 40 MCG/ML (10ML) SYRINGE FOR IV PUSH (FOR BLOOD PRESSURE SUPPORT)
PREFILLED_SYRINGE | INTRAVENOUS | Status: AC
  Filled 2018-10-03: qty 10

## 2018-10-03 MED ORDER — ACETAMINOPHEN 500 MG PO TABS
1000.0000 mg | ORAL_TABLET | Freq: Once | ORAL | Status: AC
  Administered 2018-10-03: 500 mg via ORAL
  Filled 2018-10-03: qty 2

## 2018-10-03 MED ORDER — ROCURONIUM BROMIDE 10 MG/ML (PF) SYRINGE
PREFILLED_SYRINGE | INTRAVENOUS | Status: AC
  Filled 2018-10-03: qty 10

## 2018-10-03 MED ORDER — POTASSIUM CHLORIDE IN NACL 20-0.9 MEQ/L-% IV SOLN
INTRAVENOUS | Status: DC
  Administered 2018-10-03 – 2018-10-05 (×3): via INTRAVENOUS
  Filled 2018-10-03 (×3): qty 1000

## 2018-10-03 MED ORDER — FENTANYL CITRATE (PF) 100 MCG/2ML IJ SOLN
25.0000 ug | Freq: Once | INTRAMUSCULAR | Status: AC
  Administered 2018-10-03: 25 ug via INTRAVENOUS

## 2018-10-03 MED ORDER — BUPIVACAINE HCL (PF) 0.5 % IJ SOLN
INTRAMUSCULAR | Status: AC
  Filled 2018-10-03: qty 30

## 2018-10-03 MED ORDER — SUCCINYLCHOLINE CHLORIDE 200 MG/10ML IV SOSY
PREFILLED_SYRINGE | INTRAVENOUS | Status: AC
  Filled 2018-10-03: qty 10

## 2018-10-03 MED ORDER — LIDOCAINE 2% (20 MG/ML) 5 ML SYRINGE
INTRAMUSCULAR | Status: DC | PRN
  Administered 2018-10-03: 40 mg via INTRAVENOUS

## 2018-10-03 MED ORDER — MIDAZOLAM HCL 2 MG/2ML IJ SOLN
INTRAMUSCULAR | Status: AC
  Filled 2018-10-03: qty 2

## 2018-10-03 MED ORDER — PROPOFOL 10 MG/ML IV BOLUS
INTRAVENOUS | Status: DC | PRN
  Administered 2018-10-03: 120 mg via INTRAVENOUS

## 2018-10-03 MED ORDER — CHLORHEXIDINE GLUCONATE CLOTH 2 % EX PADS
6.0000 | MEDICATED_PAD | Freq: Every day | CUTANEOUS | Status: DC
  Administered 2018-10-04 – 2018-10-05 (×3): 6 via TOPICAL

## 2018-10-03 MED ORDER — SODIUM CHLORIDE 0.9 % IV SOLN
INTRAVENOUS | Status: DC | PRN
  Administered 2018-10-03: 08:00:00 50 ug/min via INTRAVENOUS

## 2018-10-03 MED ORDER — PROMETHAZINE HCL 25 MG/ML IJ SOLN
12.5000 mg | Freq: Four times a day (QID) | INTRAMUSCULAR | Status: AC | PRN
  Administered 2018-10-03: 12.5 mg via INTRAVENOUS

## 2018-10-03 MED ORDER — PROMETHAZINE HCL 25 MG/ML IJ SOLN
INTRAMUSCULAR | Status: AC
  Filled 2018-10-03: qty 1

## 2018-10-03 MED ORDER — SUGAMMADEX SODIUM 200 MG/2ML IV SOLN
INTRAVENOUS | Status: DC | PRN
  Administered 2018-10-03: 200 mg via INTRAVENOUS

## 2018-10-03 MED ORDER — TRAMADOL HCL 50 MG PO TABS
50.0000 mg | ORAL_TABLET | Freq: Four times a day (QID) | ORAL | Status: DC | PRN
  Administered 2018-10-03: 50 mg via ORAL
  Filled 2018-10-03 (×2): qty 1

## 2018-10-03 MED ORDER — MIDAZOLAM HCL 5 MG/5ML IJ SOLN
INTRAMUSCULAR | Status: DC | PRN
  Administered 2018-10-03: 2 mg via INTRAVENOUS

## 2018-10-03 MED ORDER — ONDANSETRON HCL 4 MG/2ML IJ SOLN
INTRAMUSCULAR | Status: DC | PRN
  Administered 2018-10-03: 4 mg via INTRAVENOUS

## 2018-10-03 MED ORDER — FENTANYL CITRATE (PF) 250 MCG/5ML IJ SOLN
INTRAMUSCULAR | Status: AC
  Filled 2018-10-03: qty 5

## 2018-10-03 MED ORDER — EPHEDRINE SULFATE-NACL 50-0.9 MG/10ML-% IV SOSY
PREFILLED_SYRINGE | INTRAVENOUS | Status: DC | PRN
  Administered 2018-10-03: 5 mg via INTRAVENOUS

## 2018-10-03 MED ORDER — FENTANYL 40 MCG/ML IV SOLN
INTRAVENOUS | Status: DC
  Administered 2018-10-03: 40 ug via INTRAVENOUS
  Administered 2018-10-03: 16:00:00 via INTRAVENOUS
  Administered 2018-10-04: 0 ug via INTRAVENOUS
  Administered 2018-10-04: 20 ug via INTRAVENOUS
  Administered 2018-10-04: 18 ug via INTRAVENOUS
  Administered 2018-10-05: 10 ug via INTRAVENOUS
  Administered 2018-10-05: 50 ug via INTRAVENOUS
  Administered 2018-10-06: 10 ug via INTRAVENOUS
  Administered 2018-10-06: 30 ug via INTRAVENOUS
  Filled 2018-10-03 (×3): qty 25
  Filled 2018-10-03: qty 1000

## 2018-10-03 MED ORDER — VANCOMYCIN HCL 1000 MG IV SOLR
INTRAVENOUS | Status: DC | PRN
  Administered 2018-10-03: 07:00:00 1000 mg via INTRAVENOUS

## 2018-10-03 MED ORDER — NALOXONE HCL 0.4 MG/ML IJ SOLN: 0.4000 mg | INTRAMUSCULAR | Status: DC | PRN

## 2018-10-03 MED ORDER — LIDOCAINE 2% (20 MG/ML) 5 ML SYRINGE
INTRAMUSCULAR | Status: AC
  Filled 2018-10-03: qty 5

## 2018-10-03 MED ORDER — VANCOMYCIN HCL IN DEXTROSE 1-5 GM/200ML-% IV SOLN
1000.0000 mg | INTRAVENOUS | Status: AC
  Administered 2018-10-03: 1000 mg via INTRAVENOUS
  Filled 2018-10-03: qty 200

## 2018-10-03 MED ORDER — OXYCODONE HCL 5 MG PO TABS
5.0000 mg | ORAL_TABLET | ORAL | Status: DC | PRN
  Administered 2018-10-04 (×2): 5 mg via ORAL
  Filled 2018-10-03 (×2): qty 1

## 2018-10-03 MED ORDER — FENTANYL CITRATE (PF) 100 MCG/2ML IJ SOLN
INTRAMUSCULAR | Status: AC
  Administered 2018-10-03: 16:00:00
  Filled 2018-10-03: qty 2

## 2018-10-03 MED ORDER — SODIUM CHLORIDE 0.9% FLUSH: 9.0000 mL | INTRAVENOUS | Status: DC | PRN

## 2018-10-03 MED ORDER — BISACODYL 5 MG PO TBEC
10.0000 mg | DELAYED_RELEASE_TABLET | Freq: Every day | ORAL | Status: DC
  Administered 2018-10-05: 5 mg via ORAL
  Administered 2018-10-07: 10 mg via ORAL
  Filled 2018-10-03 (×3): qty 2

## 2018-10-03 MED ORDER — VANCOMYCIN HCL IN DEXTROSE 1-5 GM/200ML-% IV SOLN
1000.0000 mg | Freq: Two times a day (BID) | INTRAVENOUS | Status: AC
  Administered 2018-10-03: 1000 mg via INTRAVENOUS
  Filled 2018-10-03: qty 200

## 2018-10-03 MED ORDER — SODIUM CHLORIDE 0.9 % IV SOLN: INTRAVENOUS | Status: DC | PRN

## 2018-10-03 MED ORDER — ACETAMINOPHEN 500 MG PO TABS
1000.0000 mg | ORAL_TABLET | Freq: Four times a day (QID) | ORAL | Status: DC
  Administered 2018-10-03: 500 mg via ORAL
  Administered 2018-10-03 – 2018-10-05 (×4): 1000 mg via ORAL
  Filled 2018-10-03 (×8): qty 2

## 2018-10-03 MED ORDER — INSULIN ASPART 100 UNIT/ML ~~LOC~~ SOLN: 0.0000 [IU] | Freq: Four times a day (QID) | SUBCUTANEOUS | Status: DC

## 2018-10-03 MED ORDER — 0.9 % SODIUM CHLORIDE (POUR BTL) OPTIME
TOPICAL | Status: DC | PRN
  Administered 2018-10-03: 3000 mL

## 2018-10-03 MED ORDER — BUPIVACAINE HCL (PF) 0.5 % IJ SOLN
INTRAMUSCULAR | Status: DC | PRN
  Administered 2018-10-03: 30 mL

## 2018-10-03 MED ORDER — BUPIVACAINE LIPOSOME 1.3 % IJ SUSP
20.0000 mL | INTRAMUSCULAR | Status: AC
  Administered 2018-10-03: 20 mL
  Filled 2018-10-03: qty 20

## 2018-10-03 MED ORDER — EPHEDRINE 5 MG/ML INJ
INTRAVENOUS | Status: AC
  Filled 2018-10-03: qty 10

## 2018-10-03 MED ORDER — ONDANSETRON HCL 4 MG/2ML IJ SOLN
INTRAMUSCULAR | Status: AC
  Filled 2018-10-03: qty 2

## 2018-10-03 MED ORDER — FENTANYL CITRATE (PF) 100 MCG/2ML IJ SOLN
INTRAMUSCULAR | Status: DC | PRN
  Administered 2018-10-03 (×5): 50 ug via INTRAVENOUS

## 2018-10-03 MED ORDER — ROCURONIUM BROMIDE 50 MG/5ML IV SOSY
PREFILLED_SYRINGE | INTRAVENOUS | Status: DC | PRN
  Administered 2018-10-03: 10 mg via INTRAVENOUS
  Administered 2018-10-03: 60 mg via INTRAVENOUS
  Administered 2018-10-03: 40 mg via INTRAVENOUS

## 2018-10-03 MED ORDER — FENTANYL CITRATE (PF) 100 MCG/2ML IJ SOLN
INTRAMUSCULAR | Status: AC
  Filled 2018-10-03: qty 2

## 2018-10-03 MED ORDER — HEMOSTATIC AGENTS (NO CHARGE) OPTIME
TOPICAL | Status: DC | PRN
  Administered 2018-10-03: 1 via TOPICAL

## 2018-10-03 MED ORDER — FENTANYL CITRATE (PF) 100 MCG/2ML IJ SOLN
25.0000 ug | INTRAMUSCULAR | Status: DC | PRN
  Administered 2018-10-03 (×2): 25 ug via INTRAVENOUS
  Filled 2018-10-03: qty 1

## 2018-10-03 MED ORDER — DIPHENHYDRAMINE HCL 12.5 MG/5ML PO ELIX
12.5000 mg | ORAL_SOLUTION | Freq: Four times a day (QID) | ORAL | Status: DC | PRN
  Filled 2018-10-03: qty 5

## 2018-10-03 MED ORDER — ONDANSETRON HCL 4 MG/2ML IJ SOLN
4.0000 mg | Freq: Four times a day (QID) | INTRAMUSCULAR | Status: DC | PRN
  Administered 2018-10-04 – 2018-10-05 (×3): 4 mg via INTRAVENOUS
  Filled 2018-10-03 (×3): qty 2

## 2018-10-03 MED ORDER — ACETAMINOPHEN 160 MG/5ML PO SOLN
1000.0000 mg | Freq: Four times a day (QID) | ORAL | Status: DC
  Administered 2018-10-05: 1000 mg via ORAL
  Filled 2018-10-03: qty 40.6

## 2018-10-03 MED ORDER — DIPHENHYDRAMINE HCL 50 MG/ML IJ SOLN: 12.5000 mg | Freq: Four times a day (QID) | INTRAMUSCULAR | Status: DC | PRN

## 2018-10-03 MED ORDER — ALBUMIN HUMAN 5 % IV SOLN
INTRAVENOUS | Status: DC | PRN
  Administered 2018-10-03: 09:00:00 via INTRAVENOUS

## 2018-10-03 SURGICAL SUPPLY — 97 items
BLADE CLIPPER SURG (BLADE) ×3 IMPLANT
CANISTER SUCT 3000ML PPV (MISCELLANEOUS) ×6 IMPLANT
CATH THORACIC 28FR (CATHETERS) ×3 IMPLANT
CATH THORACIC 36FR (CATHETERS) IMPLANT
CATH THORACIC 36FR RT ANG (CATHETERS) IMPLANT
CLIP VESOCCLUDE MED 6/CT (CLIP) ×3 IMPLANT
CONN ST 1/4X3/8  BEN (MISCELLANEOUS)
CONN ST 1/4X3/8 BEN (MISCELLANEOUS) IMPLANT
CONN Y 3/8X3/8X3/8  BEN (MISCELLANEOUS)
CONN Y 3/8X3/8X3/8 BEN (MISCELLANEOUS) IMPLANT
CONT SPEC 4OZ CLIKSEAL STRL BL (MISCELLANEOUS) ×18 IMPLANT
COVER SURGICAL LIGHT HANDLE (MISCELLANEOUS) ×3 IMPLANT
COVER WAND RF STERILE (DRAPES) IMPLANT
CUTTER ECHEON FLEX ENDO 45 340 (ENDOMECHANICALS) ×3 IMPLANT
DERMABOND ADHESIVE PROPEN (GAUZE/BANDAGES/DRESSINGS) ×1
DERMABOND ADVANCED (GAUZE/BANDAGES/DRESSINGS) ×1
DERMABOND ADVANCED .7 DNX12 (GAUZE/BANDAGES/DRESSINGS) ×2 IMPLANT
DERMABOND ADVANCED .7 DNX6 (GAUZE/BANDAGES/DRESSINGS) ×2 IMPLANT
DRAIN CHANNEL 28F RND 3/8 FF (WOUND CARE) IMPLANT
DRAIN CHANNEL 32F RND 10.7 FF (WOUND CARE) IMPLANT
DRAPE CV SPLIT W-CLR ANES SCRN (DRAPES) ×3 IMPLANT
DRAPE ORTHO SPLIT 77X108 STRL (DRAPES)
DRAPE SURG ORHT 6 SPLT 77X108 (DRAPES) IMPLANT
DRAPE WARM FLUID 44X44 (DRAPES) ×3 IMPLANT
ELECT BLADE 6.5 EXT (BLADE) ×3 IMPLANT
ELECT REM PT RETURN 9FT ADLT (ELECTROSURGICAL) ×3
ELECTRODE REM PT RTRN 9FT ADLT (ELECTROSURGICAL) ×2 IMPLANT
GAUZE SPONGE 4X4 12PLY STRL (GAUZE/BANDAGES/DRESSINGS) IMPLANT
GAUZE SPONGE 4X4 16PLY XRAY LF (GAUZE/BANDAGES/DRESSINGS) ×3 IMPLANT
GLOVE BIO SURGEON STRL SZ 6.5 (GLOVE) ×6 IMPLANT
GLOVE BIOGEL PI IND STRL 6.5 (GLOVE) ×4 IMPLANT
GLOVE BIOGEL PI INDICATOR 6.5 (GLOVE) ×2
GLOVE SURG SIGNA 7.5 PF LTX (GLOVE) IMPLANT
GLOVE TRIUMPH SURG SIZE 7.5 (KITS) ×6 IMPLANT
GOWN STRL REUS W/ TWL LRG LVL3 (GOWN DISPOSABLE) ×4 IMPLANT
GOWN STRL REUS W/ TWL XL LVL3 (GOWN DISPOSABLE) ×4 IMPLANT
GOWN STRL REUS W/TWL LRG LVL3 (GOWN DISPOSABLE) ×2
GOWN STRL REUS W/TWL XL LVL3 (GOWN DISPOSABLE) ×2
HEMOSTAT SURGICEL 2X14 (HEMOSTASIS) IMPLANT
KIT BASIN OR (CUSTOM PROCEDURE TRAY) ×3 IMPLANT
KIT SUCTION CATH 14FR (SUCTIONS) ×3 IMPLANT
KIT TURNOVER KIT B (KITS) ×3 IMPLANT
NEEDLE SPNL 18GX3.5 QUINCKE PK (NEEDLE) IMPLANT
NS IRRIG 1000ML POUR BTL (IV SOLUTION) ×9 IMPLANT
PACK CHEST (CUSTOM PROCEDURE TRAY) ×3 IMPLANT
PACK UNIVERSAL I (CUSTOM PROCEDURE TRAY) ×3 IMPLANT
PAD ARMBOARD 7.5X6 YLW CONV (MISCELLANEOUS) ×6 IMPLANT
POUCH ENDO CATCH II 15MM (MISCELLANEOUS) IMPLANT
POUCH SPECIMEN RETRIEVAL 10MM (ENDOMECHANICALS) IMPLANT
RELOAD STAPLER GREEN 60MM (STAPLE) IMPLANT
SCISSORS LAP 5X35 DISP (ENDOMECHANICALS) IMPLANT
SEALANT PROGEL (MISCELLANEOUS) IMPLANT
SEALANT SURG COSEAL 4ML (VASCULAR PRODUCTS) IMPLANT
SEALANT SURG COSEAL 8ML (VASCULAR PRODUCTS) IMPLANT
SHEARS HARMONIC HDI 20CM (ELECTROSURGICAL) IMPLANT
SOL ANTI FOG 6CC (MISCELLANEOUS) ×2 IMPLANT
SOLUTION ANTI FOG 6CC (MISCELLANEOUS) ×1
SPECIMEN JAR MEDIUM (MISCELLANEOUS) ×3 IMPLANT
SPONGE INTESTINAL PEANUT (DISPOSABLE) ×3 IMPLANT
SPONGE TONSIL TAPE 1 RFD (DISPOSABLE) ×3 IMPLANT
STAPLE RELOAD 45 GRN (STAPLE) ×2 IMPLANT
STAPLE RELOAD 45MM GOLD (STAPLE) ×12 IMPLANT
STAPLE RELOAD 45MM GREEN (STAPLE) ×1
STAPLER RELOAD GREEN 60MM (STAPLE)
STOPCOCK 4 WAY LG BORE MALE ST (IV SETS) ×3 IMPLANT
SUT PROLENE 4 0 RB 1 (SUTURE)
SUT PROLENE 4-0 RB1 .5 CRCL 36 (SUTURE) IMPLANT
SUT SILK  1 MH (SUTURE) ×2
SUT SILK 1 MH (SUTURE) ×4 IMPLANT
SUT SILK 1 TIES 10X30 (SUTURE) ×3 IMPLANT
SUT SILK 2 0 SH (SUTURE) IMPLANT
SUT SILK 2 0SH CR/8 30 (SUTURE) ×3 IMPLANT
SUT SILK 3 0 SH 30 (SUTURE) IMPLANT
SUT SILK 3 0SH CR/8 30 (SUTURE) ×3 IMPLANT
SUT VIC AB 0 CTX 27 (SUTURE) IMPLANT
SUT VIC AB 1 CTX 27 (SUTURE) ×3 IMPLANT
SUT VIC AB 2-0 CT1 27 (SUTURE)
SUT VIC AB 2-0 CT1 TAPERPNT 27 (SUTURE) IMPLANT
SUT VIC AB 2-0 CTX 36 (SUTURE) ×3 IMPLANT
SUT VIC AB 3-0 MH 27 (SUTURE) IMPLANT
SUT VIC AB 3-0 SH 27 (SUTURE)
SUT VIC AB 3-0 SH 27X BRD (SUTURE) IMPLANT
SUT VIC AB 3-0 X1 27 (SUTURE) ×3 IMPLANT
SUT VICRYL 0 UR6 27IN ABS (SUTURE) IMPLANT
SUT VICRYL 2 TP 1 (SUTURE) IMPLANT
SYR 10ML LL (SYRINGE) ×3 IMPLANT
SYR 30ML LL (SYRINGE) ×3 IMPLANT
SYR 50ML LL SCALE MARK (SYRINGE) ×3 IMPLANT
SYSTEM SAHARA CHEST DRAIN ATS (WOUND CARE) ×3 IMPLANT
TAPE CLOTH SURG 4X10 WHT LF (GAUZE/BANDAGES/DRESSINGS) ×3 IMPLANT
TIP APPLICATOR SPRAY EXTEND 16 (VASCULAR PRODUCTS) IMPLANT
TOWEL GREEN STERILE (TOWEL DISPOSABLE) ×3 IMPLANT
TOWEL GREEN STERILE FF (TOWEL DISPOSABLE) ×3 IMPLANT
TRAY FOLEY MTR SLVR 16FR STAT (SET/KITS/TRAYS/PACK) ×3 IMPLANT
TROCAR XCEL BLADELESS 5X75MML (TROCAR) ×3 IMPLANT
TROCAR XCEL NON-BLD 5MMX100MML (ENDOMECHANICALS) IMPLANT
WATER STERILE IRR 1000ML POUR (IV SOLUTION) ×6 IMPLANT

## 2018-10-03 NOTE — Transfer of Care (Signed)
Immediate Anesthesia Transfer of Care Note  Patient: Shelly [name]  Procedure(s) Performed: VIDEO ASSISTED THORACOSCOPY (VATS)/WEDGE RESECTION with multiple node dissection. (Right Chest)  Patient Location: PACU  Anesthesia Type:General  Level of Consciousness: awake, alert , oriented and sedated  Airway & Oxygen Therapy: Patient connected to face mask oxygen  Post-op Assessment: Post -op Vital signs reviewed and stable  Post vital signs: stable  Last Vitals:  Vitals Value Taken Time  BP 158/100 10/03/18 1002  Temp    Pulse 87 10/03/18 1005  Resp 21 10/03/18 1005  SpO2 100 % 10/03/18 1005  Vitals shown include unvalidated device data.  Last Pain:  Vitals:   10/03/18 0623  TempSrc:   PainSc: 0-No pain      Patients Stated Pain Goal: 5 (91/79/15 0569)  Complications: No apparent anesthesia complications

## 2018-10-03 NOTE — Anesthesia Procedure Notes (Signed)
Arterial Line Insertion Start/End9/28/2020 7:10 AM, 10/03/2018 7:15 AM Performed by: Lavell Luster, CRNA, CRNA  Preanesthetic checklist: patient identified, IV checked, risks and benefits discussed, surgical consent, pre-op evaluation and timeout performed Lidocaine 1% used for infiltration and patient sedated Right, radial was placed Catheter size: 20 G Hand hygiene performed , maximum sterile barriers used  and Seldinger technique used Allen's test indicative of satisfactory collateral circulation Attempts: 1 Procedure performed without using ultrasound guided technique. Following insertion, dressing applied and Biopatch. Post procedure assessment: normal  Patient tolerated the procedure well with no immediate complications.

## 2018-10-03 NOTE — Op Note (Signed)
NAME: [name], [name] MEDICAL RECORD [id number] ACCOUNT [id number] DATE OF BIRTH:1945/01/28 FACILITY: [facility] LOCATION: [facility] PHYSICIAN:Ahmoni Edge C. Leondre Taul, MD  OPERATIVE REPORT  DATE OF PROCEDURE:  10/03/2018  PREOPERATIVE DIAGNOSIS:  Right upper lobe nodule, suspected carcinoid tumor, clinical stage IA (T1, N0).  POSTOPERATIVE DIAGNOSIS:  Probable carcinoid tumor, clinical stage IA (T1, N0).  PROCEDURE:   Right video-assisted thoracoscopy, Wedge resection right upper lobe nodule, Lymph node sampling, and Intercostal nerve block from levels 3 through 10.  SURGEON:  Modesto Charon, MD  ASSISTANTS:  Melodie Bouillon, MD; Lars Pinks, PA-C  ANESTHESIA:  General.  FINDINGS:  Nodule in posterior aspect of right upper lobe.  Frozen section revealed epithelioid tumor, probable carcinoid. Margin clear.  Normal-appearing lymph nodes.  CLINICAL NOTE:  The patient is a 73 year old woman with a history of tobacco abuse who was found to have multiple lung nodules in 2010.  These were followed for several years without any significant change in size.  Recently, she presented with a cough,  and on repeat CT, the nodule in the right upper lobe had increased in size.  A PET CT showed this nodule was hypermetabolic with an SUV of 6.  She was advised to undergo surgical resection for definitive diagnosis and treatment.  The indications, risks,  benefits, and alternatives were discussed in detail with the patient.  She accepted the risks and agreed to proceed.  OPERATIVE NOTE:  Shelly [name] was brought to the preoperative holding area on 10/03/2018.  Anesthesia placed a central venous catheter and an arterial blood pressure monitoring line.  She was taken to the operating room, anesthetized, and intubated with a  double-lumen endotracheal tube.  Intravenous antibiotics were administered.  A Foley catheter was placed.  Sequential compression devices were placed on the calves for DVT  prophylaxis.  She was placed in a left lateral decubitus position, and the right  chest was prepped and draped in the usual sterile fashion.  Single-lung ventilation of the left lung was initiated and was tolerated well throughout the procedure.  A timeout was performed.  A solution containing 20 mL of liposomal bupivacaine, 30 mL of 0.5% bupivacaine, and 50 mL of saline was prepared.  This was used for local at the incision sites, which were injected prior to making the incisions, as well as for  the intercostal nerve blocks.  An incision was made in the 8th interspace in the midaxillary line.  A 5 mm port was inserted.  The thoracoscope was advanced into the chest.  There was good isolation of the right lung.  A 4 cm working incision was made in the 4th interspace  anterolaterally.  No rib spreading was performed during the procedure.  Intercostal nerve blocks then were performed by injecting 10 mL of the bupivacaine solution into a subpleural plane at each interspace from the 3rd to the 10th.  The nodule was  palpable in the posterior aspect of the right upper lobe.  A wedge resection was performed, maintaining a 2 cm gross margin using sequential firings of an Echelon 45 mm stapler.  Both gold and green cartridges were used.  The specimen was removed and  sent for frozen section.  The inferior ligament was divided and the lymph nodes were removed.  All lymph nodes were benign in appearance, and all were sent for permanent pathology as separate specimens.  The pleural reflection was divided at the hilum  posteriorly, and a relatively large but otherwise benign-appearing level 7 node was removed.  A  second smaller level 7 node was removed as well.  There was some bleeding in this area, and Surgicel was applied.  In the fissure, a level 12 node was  identified that was removed with no significant bleeding.  On inspection of the peritracheal area, there was relatively little fat in the area, but there  were no lymph nodes visible.  No dissection was done in this area.  The frozen section returned  showing epithelioid tumor that was likely a carcinoid.  The margin was clear.  The chest was copiously irrigated with warm saline.  A 28-French chest tube was placed through the original port incision and directed towards the apex.  It was secured with a  #1 silk suture.  Dual-lung ventilation was resumed.  The working incision then was closed in 3 layers with a #1 Vicryl fascial suture.  Subcutaneous tissue and skin were closed in standard fashion.  Dermabond was applied.  The chest tube was placed to  suction.  The patient then was extubated in the operating room and taken to the postanesthetic care unit in good condition.  LN/NUANCE  D:10/03/2018 T:10/03/2018 JOB:[id number]

## 2018-10-03 NOTE — Interval H&P Note (Signed)
History and Physical Interval Note:  10/03/2018 7:22 AM  Shelly [name]  has presented today for surgery, with the diagnosis of RUL NODULE.  The various methods of treatment have been discussed with the patient and family. After consideration of risks, benefits and other options for treatment, the patient has consented to  Procedure(s): VIDEO ASSISTED THORACOSCOPY (VATS)/WEDGE RESECTION (Right) possible right upper LOBECTOMY (Right) as a surgical intervention.  The patient's history has been reviewed, patient examined, no change in status, stable for surgery.  I have reviewed the patient's chart and labs.  Questions were answered to the patient's satisfaction.     Melrose Nakayama

## 2018-10-03 NOTE — Anesthesia Postprocedure Evaluation (Signed)
Anesthesia Post Note  Patient: KEYMONI MCCASTER  Procedure(s) Performed: VIDEO ASSISTED THORACOSCOPY (VATS)/WEDGE RESECTION with multiple node dissection. (Right Chest)     Patient location during evaluation: PACU Anesthesia Type: General Level of consciousness: awake and alert Pain management: pain level controlled Vital Signs Assessment: post-procedure vital signs reviewed and stable Respiratory status: spontaneous breathing, nonlabored ventilation, respiratory function stable and patient connected to nasal cannula oxygen Cardiovascular status: blood pressure returned to baseline and stable Postop Assessment: no apparent nausea or vomiting Anesthetic complications: no    Last Vitals:  Vitals:   10/03/18 1130 10/03/18 1200  BP: (!) 170/94 (!) 147/89  Pulse: 88 86  Resp: 18 13  Temp:    SpO2: 93% 92%    Last Pain:  Vitals:   10/03/18 1200  TempSrc:   PainSc: 7                  Alexsus Papadopoulos L Savaughn Karwowski

## 2018-10-03 NOTE — Anesthesia Procedure Notes (Signed)
Central Venous Catheter Insertion Performed by: Albertha Ghee, MD, anesthesiologist Start/End9/28/2020 7:04 AM, 10/03/2018 7:14 AM Patient location: Pre-op. Preanesthetic checklist: patient identified, IV checked, site marked, risks and benefits discussed, surgical consent, monitors and equipment checked, pre-op evaluation, timeout performed and anesthesia consent Position: Trendelenburg Lidocaine 1% used for infiltration and patient sedated Hand hygiene performed , maximum sterile barriers used  and Seldinger technique used Catheter size: 7 Fr Central line was placed.Double lumen Procedure performed using ultrasound guided technique. Ultrasound Notes:anatomy identified, needle tip was noted to be adjacent to the nerve/plexus identified, no ultrasound evidence of intravascular and/or intraneural injection and image(s) printed for medical record Attempts: 1 Following insertion, line sutured, dressing applied and Biopatch. Post procedure assessment: blood return through all ports, free fluid flow and no air  Patient tolerated the procedure well with no immediate complications.

## 2018-10-03 NOTE — Anesthesia Procedure Notes (Signed)
Procedure Name: Intubation Date/Time: 10/03/2018 7:00 AM Performed by: Lavell Luster, CRNA Pre-anesthesia Checklist: Patient identified, Emergency Drugs available, Suction available, Patient being monitored and Timeout performed Patient Re-evaluated:Patient Re-evaluated prior to induction Oxygen Delivery Method: Circle system utilized Preoxygenation: Pre-oxygenation with 100% oxygen Induction Type: IV induction Ventilation: Mask ventilation without difficulty Laryngoscope Size: Mac and 3 Grade View: Grade I Tube type: Oral Endobronchial tube: Double lumen EBT, Left and EBT position confirmed by fiberoptic bronchoscope and 37 Fr Number of attempts: 1 Airway Equipment and Method: Stylet Placement Confirmation: ETT inserted through vocal cords under direct vision,  positive ETCO2 and breath sounds checked- equal and bilateral Tube secured with: Tape Dental Injury: Teeth and Oropharynx as per pre-operative assessment

## 2018-10-03 NOTE — Discharge Instructions (Signed)
Discharge Instructions:  1. You may shower, please wash incisions daily with soap and water and keep dry.  If you wish to cover wounds with dressing you may do so but please keep clean and change daily.  No tub baths or swimming until incisions have completely healed.  If your incisions become red or develop any drainage please call our office at [phone]  2. No Driving until cleared by Dr. Hendrickson's office and you are no longer using narcotic pain medications  3. Fever of 101.5 for at least 24 hours with no source, please contact our office at [phone]  4. Activity- up as tolerated, please walk at least 3 times per day.  Avoid strenuous activity, no lifting, pushing, or pulling with your arms over 8-10 lbs for a minimum of 6 weeks  5. If any questions or concerns arise, please do not hesitate to contact our office at [phone]  

## 2018-10-03 NOTE — Progress Notes (Signed)
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 As a result, she is unable to be given Lovenox or sub Q Heparin. I did discuss with pharmacist who confirmed she should NOT be given Lovenox or Heparin.

## 2018-10-03 NOTE — Brief Op Note (Signed)
10/03/2018  9:29 AM  PATIENT:  Shelly [name]  73 y.o. female  PRE-OPERATIVE DIAGNOSIS:  RUL NODULE, SUSPECTED CARCINOID TUMOR - Clinical stage IA- T1N0  POST-OPERATIVE DIAGNOSIS:  RUL NODULE, PROBABLE CARCINOID TUMOR- Clinical stage IA- T1N0  PROCEDURE:   RIGHT VIDEO ASSISTED THORACOSCOPY (VATS),  WEDGE RESECTION RIGHT UPPER LOBE NODULE LYMPH NODE SAMPLING INTERCOSTAL NERVE BLOCK- LEVELS 3-10  SURGEON:  Surgeon(s) and Role:    * Melrose Nakayama, MD - Primary    * Lightfoot, Lucile Crater, MD - Assisting  PHYSICIAN ASSISTANT: Lars Pinks PA-C  ANESTHESIA:   general  EBL:  MINIMAL   BLOOD ADMINISTERED:none  DRAINS: Chest tube placed in the right pleural space   LOCAL MEDICATIONS USED:  OTHER. Exparel  SPECIMEN:  Source of Specimen:  Wedge RUL, lymph nodes  DISPOSITION OF SPECIMEN:  PATHOLOGY  COUNTS CORRECT:  YES  DICTATION: .Dragon Dictation  PLAN OF CARE: Admit to inpatient   PATIENT DISPOSITION:  PACU - hemodynamically stable.   Delay start of Pharmacological VTE agent (>24hrs) due to surgical blood loss or risk of bleeding: yes

## 2018-10-03 NOTE — Discharge Summary (Addendum)
Physician Discharge Summary       [address]       [place] [address]             [phone]    Patient ID: Shelly [name] MRN: [id number] DOB/AGE: Feb 02, 1945 73 y.o.  Admit date: 10/03/2018 Discharge date: 10/07/2018  Admission Diagnoses: Nodule of upper lobe of right lung  Discharge Diagnoses:  1. S/p right VATS, wedge RUL, LN sampling, intercostal nerve block 2. 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 History of tobacco abuse 4. History of Transient gluten sensitivity 5. History of arthritis 6. History of allergic rhinitis  Consults: None  Procedure (s):  Right video-assisted thoracoscopy, wedge resection, right upper lobe nodule, lymph node sampling, and intercostal nerve block from levels 3 through 10 by Dr. Roxan Hockey on 10/03/2018.  Pathology: Typical carcinoid, spanning 1.5 cm.  TNM Code: pT1b, pN0   History of Presenting Illness: Shelly [name] is a 73 year old woman with past medical history significant for tobacco use (less than half a pack a day for 50 years), arthritis, gluten allergy, and alpha-gal.  She was found to have multiple lung nodules back in 2010.  These were followed radiographically until 2013 without any significant change in size.  She saw Dr. Orland Mustard in February 2020.  She complained of a cough.  A repeat CT was done to follow-up on the lung nodules and a nodule in the right upper lobe had increased in size while other nodules have remained stable.  Follow-up was recommended.  She had a follow-up with the PET CT in July.  That showed the right upper lobe nodule was hypermetabolic with an SUV of 6.  There were other nodules that were not metabolically active.  There was some activity at the base of her tongue.  That has been evaluated, and was not significant.  She is retired.  She remains fairly active.  Her exertion is mainly limited by arthritis and back pain.  She does not have shortness of breath with walking  upstairs or walking for 45 minutes on level ground.  She does have a productive cough, no hemoptysis.  No significant weight change or change in appetite.  She thinks she may have gained a few pounds during the COVID isolation.  She denies any chest pain, pressure, or tightness with exertion.  Dr. Roxan Hockey recommended that we proceed with right VATS for wedge resection and possible right upper lobectomy depending on intraoperative frozen section findings.  Dr. Roxan Hockey described the general nature of the procedure to the patient and her significant other.  They understand the need for general anesthesia, the incisions to be used, the intraoperative decision making, the use of a drainage tube postoperatively, the expected hospital stay, and the overall recovery.  Dr. Roxan Hockey informed them of the indications, risk, benefits, and alternatives. He underwent a right VATS, wedge RUL, LN sampling, intercostal nerve block on 10/03/2018.  Brief Hospital Course:  The patient remained afebrile and hemodynamically stable. A line and foley were removed early in the post operative course. Chest tube output gradually decreased and there was no air leak. Daily chest x rays was obtained and remained stable. Chest tube was placed to water seal on 09/29. Chest tube was removed by 10/01, after output decreased. Patient had stomach/epigastric discomfort 09/30. She is very sensitive to medications. She believes it was the oral potassium. This did resolve. Patient is ambulating on room air. Patient is tolerating a diet and has had a  bowel movement. Wounds are clean and dry. Final chest X ray showed mildly decreased small, right apical pneumothorax. As discussed with Dr. Roxan Hockey, the patient is felt surgically stable for discharge today.   Latest Vital Signs: Blood pressure 112/62, pulse 80, temperature 98.8 F (37.1 C), temperature source Oral, resp. rate 17, height 5\' 4"  (1.626 m), weight 59 kg, SpO2 98  %.  Physical Exam: Cardiovascular: RRR Pulmonary: Clear to auscultation bilaterally Abdomen: Soft, non tender, bowel sounds present. Extremities: SCDs in place Wounds: Clean and dry.  No erythema or signs of infection.  Discharge Condition: Stable and discharged to home.  Recent laboratory studies:  Lab Results  Component Value Date   WBC 11.0 (H) 10/05/2018   HGB 11.8 (L) 10/05/2018   HCT 35.8 (L) 10/05/2018   MCV 96.2 10/05/2018   PLT 291 10/05/2018   Lab Results  Component Value Date   NA 141 10/05/2018   K 3.8 10/05/2018   CL 107 10/05/2018   CO2 23 10/05/2018   CREATININE 0.86 10/05/2018   GLUCOSE 108 (H) 10/05/2018      Diagnostic Studies: Dg Chest 2 View  Result Date: 10/07/2018 CLINICAL DATA:  Follow-up pneumothorax. EXAM: CHEST - 2 VIEW COMPARISON:  10/06/2018. FINDINGS: Small right apical pneumothorax only decreased in size from the previous exam. Pleural line is obscured the superior cortical margin of the posterior right third rib. No acute findings lungs. IMPRESSION: 1. Mildly decreased in size from the previous day's exam. No other change. 2. Essentially clear. Electronically Signed   By: Lajean Manes M.D.   On: 10/07/2018 08:31   Dg Chest 2 View  Result Date: 10/03/2018 CLINICAL DATA:  Preoperative chest x-ray for lung biopsy. Right lung nodule. EXAM: CHEST - 2 VIEW COMPARISON:  CT 03/04/2018.  Chest x-ray 02/22/2018. FINDINGS: Mediastinum hilar structures normal. Persistent unchanged right upper lung nodule is again noted. Reference is made to recent chest CT report for discussion of other pulmonary nodules present. No pleural effusion or pneumothorax. Heart size normal. No acute bony abnormality. Surgical clips right upper quadrant. IMPRESSION: 1. Persistent unchanged right upper lung pulmonary nodules again noted. Reference is made to recent chest CT report for discussion of pulmonary nodules present. 2.  No acute cardiopulmonary disease noted. Electronically  Signed   By: Marcello Moores  Register   On: 10/03/2018 06:59   Dg Chest 1v Repeat Same Day  Result Date: 10/06/2018 CLINICAL DATA:  Status post chest tube removal EXAM: CHEST - 1 VIEW SAME DAY COMPARISON:  Film from earlier in the same day. FINDINGS: Previously seen right-sided chest the interval. The tiny apical pneumothorax on the right is again identified and stable. No significant enlargement is noted. The lungs are otherwise clear. No bony abnormality is noted. IMPRESSION: Stable apical pneumothorax on the right following chest tube removal. Electronically Signed   By: Inez Catalina M.D.   On: 10/06/2018 12:53   Dg Chest Port 1 View  Result Date: 10/06/2018 CLINICAL DATA:  Chest tube, pneumothorax. EXAM: PORTABLE CHEST 1 VIEW COMPARISON:  Radiograph of October 05, 2018. FINDINGS: The heart size and mediastinal contours are within normal limits. Right-sided chest tube is unchanged in position. Minimal right apical pneumothorax is noted which is slightly enlarged compared to prior exam. Right internal jugular catheter has been removed. Minimal bibasilar subsegmental atelectasis is noted. The visualized skeletal structures are unremarkable. IMPRESSION: Stable position of right-sided chest tube. Minimal right apical pneumothorax is noted which is slightly enlarged compared to prior exam. Minimal bibasilar subsegmental atelectasis.  Electronically Signed   By: Marijo Conception M.D.   On: 10/06/2018 09:11   Dg Chest Port 1 View  Result Date: 10/05/2018 CLINICAL DATA:  Chest tube EXAM: PORTABLE CHEST 1 VIEW COMPARISON:  10/04/2018 FINDINGS: Right chest tube and central line remain in place, unchanged. No pneumothorax. Heart is normal size. Medial right upper lobe density again noted, similar prior study. Left lung clear. No effusions. IMPRESSION: Right chest tube in place.  No visible pneumothorax. Medial right upper lobe airspace densities similar prior study Electronically Signed   By: Rolm Baptise M.D.   On:  10/05/2018 08:31   Dg Chest Port 1 View  Result Date: 10/04/2018 CLINICAL DATA:  Chest tube.  Evaluate pneumothorax. EXAM: PORTABLE CHEST 1 VIEW COMPARISON:  Chest x-rays from October 03, 2018. PET-CT July 19, 2018. FINDINGS: A right chest tube remains in place. No definitive pneumothorax is identified. Suture lines are seen in the medial right upper lobe. There is increasing oval density just lateral to the suture line. No abnormality was seen in this region on the comparison PET-CT or the first chest x-ray from October 03, 2018. The right central line is stable. The left lung is clear. The cardiomediastinal silhouette is otherwise unchanged. IMPRESSION: 1. The increased oval opacity lateral to the suture lines in the medial right apex were not present on the October 03, 2018 study prior to surgery and have increased since the first postoperative study. The findings are likely postoperative in nature such as a hematoma. Recommend attention on follow-up. 2. Support apparatus as above.  No pneumothorax on the right. Electronically Signed   By: Dorise Bullion III M.D   On: 10/04/2018 08:36   Dg Chest Port 1 View  Result Date: 10/03/2018 CLINICAL DATA:  Post VATS. EXAM: PORTABLE CHEST 1 VIEW COMPARISON:  10/03/2018 FINDINGS: Right chest tube is in place. Small right apical pneumothorax noted. Right central line tip is in the SVC. Postoperative changes in the right upper lung. Left lung clear. Heart is normal size. No effusions. IMPRESSION: Postoperative changes on the right. Right chest tube in place with small right apical pneumothorax. Electronically Signed   By: Rolm Baptise M.D.   On: 10/03/2018 10:19         Discharge Medications: Allergies as of 10/07/2018      Reactions   Alpha-gal Anaphylaxis   Throat swelling.   Gelatin Anaphylaxis   Gluten Diarrhea, Nausea Only   Hyoscyamine Sulfate Nausea Only   Clindamycin Rash   Cortisone Rash   Penicillins Rash   Did it involve swelling of  the face/tongue/throat, SOB, or low BP? No Did it involve sudden or severe rash/hives, skin peeling, or any reaction on the inside of your mouth or nose? Yes Did you need to seek medical attention at a hospital or doctor's office? No When did it last happen?5 years ago (~2015) If all above answers are NO, may proceed with cephalosporin use.   Prednisone Rash   Sulfonamide Derivatives Rash      Medication List    TAKE these medications   acetaminophen 500 MG tablet Commonly known as: TYLENOL Take 1,000 mg by mouth every 6 (six) hours as needed (headaches.).   cetirizine 10 MG tablet Commonly known as: ZYRTEC Take 5 mg by mouth daily as needed for allergies.   ibuprofen 200 MG tablet Commonly known as: ADVIL Take 200 mg by mouth every 6 (six) hours as needed for mild pain (muscle pain.).   ipratropium 0.06 % nasal  spray Commonly known as: ATROVENT Place 2 sprays into the nose 4 (four) times daily as needed (congestion.).   pseudoephedrine 30 MG tablet Commonly known as: SUDAFED Take 15 mg by mouth every 4 (four) hours as needed for congestion.   traMADol 50 MG tablet Commonly known as: ULTRAM Take 1 tablet (50 mg total) by mouth every 6 (six) hours as needed (mild pain).       Follow Up Appointments: Follow-up Information    Melrose Nakayama, MD. Go on 10/03/2018.   Specialty: Cardiothoracic Surgery Why: PA/LAT CXR to be taken (at [facility] which is in the same building as Dr. Leonarda Salon office on 10/20 at 3:00;Appointment time is at 3:30 pm  Contact information: [address] [place] [address] [phone]        Nurse. Go on 10/20/2018.   Why: Appointment time is at 11:00 am  Contact information: [place] [address]          Signed: Sharalyn Ink [facility] 10/07/2018, 8:37 AM

## 2018-10-04 ENCOUNTER — Encounter (HOSPITAL_COMMUNITY): Payer: Self-pay | Admitting: Thoracic Surgery (Cardiothoracic Vascular Surgery)

## 2018-10-04 ENCOUNTER — Inpatient Hospital Stay (HOSPITAL_COMMUNITY): Payer: Medicare Other

## 2018-10-04 LAB — GLUCOSE, CAPILLARY
Glucose-Capillary: 101 mg/dL — ABNORMAL HIGH (ref 70–99)
Glucose-Capillary: 101 mg/dL — ABNORMAL HIGH (ref 70–99)
Glucose-Capillary: 111 mg/dL — ABNORMAL HIGH (ref 70–99)
Glucose-Capillary: 135 mg/dL — ABNORMAL HIGH (ref 70–99)

## 2018-10-04 LAB — BASIC METABOLIC PANEL
Anion gap: 8 (ref 5–15)
BUN: 7 mg/dL — ABNORMAL LOW (ref 8–23)
CO2: 24 mmol/L (ref 22–32)
Calcium: 8 mg/dL — ABNORMAL LOW (ref 8.9–10.3)
Chloride: 105 mmol/L (ref 98–111)
Creatinine, Ser: 0.8 mg/dL (ref 0.44–1.00)
GFR calc Af Amer: >60 mL/min (ref >60)
GFR calc non Af Amer: >60 mL/min (ref >60)
Glucose, Bld: 107 mg/dL — ABNORMAL HIGH (ref 70–99)
Potassium: 3.8 mmol/L (ref 3.5–5.1)
Sodium: 137 mmol/L (ref 135–145)

## 2018-10-04 LAB — CBC
HCT: 32.7 % — ABNORMAL LOW (ref 36.0–46.0)
Hemoglobin: 10.9 g/dL — ABNORMAL LOW (ref 12.0–15.0)
MCH: 31.9 pg (ref 26.0–34.0)
MCHC: 33.3 g/dL (ref 30.0–36.0)
MCV: 95.6 fL (ref 80.0–100.0)
Platelets: 273 10*3/uL (ref 150–400)
RBC: 3.42 MIL/uL — ABNORMAL LOW (ref 3.87–5.11)
RDW: 12.6 % (ref 11.5–15.5)
WBC: 9.7 10*3/uL (ref 4.0–10.5)
nRBC: 0 % (ref 0.0–0.2)

## 2018-10-04 LAB — SURGICAL PATHOLOGY

## 2018-10-04 MED ORDER — KETOROLAC TROMETHAMINE 15 MG/ML IJ SOLN
15.0000 mg | Freq: Four times a day (QID) | INTRAMUSCULAR | Status: AC
  Administered 2018-10-04 – 2018-10-05 (×4): 15 mg via INTRAVENOUS
  Filled 2018-10-04 (×4): qty 1

## 2018-10-04 MED ORDER — POTASSIUM CHLORIDE CRYS ER 20 MEQ PO TBCR
20.0000 meq | EXTENDED_RELEASE_TABLET | Freq: Once | ORAL | Status: AC
  Administered 2018-10-04: 20 meq via ORAL
  Filled 2018-10-04: qty 1

## 2018-10-04 NOTE — Progress Notes (Addendum)
      [address]       [place] [address]             [phone]       1 Day Post-Op Procedure(s) (LRB): VIDEO ASSISTED THORACOSCOPY (VATS)/WEDGE RESECTION with multiple node dissection. (Right)  Subjective: Patient with some incisional,chest tube pain. She is asking for an incentive spirometer and wants to talk to Dr. Roxan Hockey.  Objective: Vital signs in last 24 hours: Temp:  [98.2 F (36.8 C)-98.9 F (37.2 C)] 98.8 F (37.1 C) (09/29 0414) Pulse Rate:  [83-102] 88 (09/29 0414) Cardiac Rhythm: Normal sinus rhythm (09/28 1326) Resp:  [13-25] 16 (09/29 0414) BP: (100-170)/(45-100) 100/56 (09/29 0414) SpO2:  [92 %-100 %] 100 % (09/29 0414) Arterial Line BP: (136-155)/(65-68) 155/68 (09/28 1045)      Intake/Output from previous day: 09/28 0701 - 09/29 0700 In: 1350 [I.V.:1100; IV Piggyback:250] Out: 2585 [Urine:2345; Blood:50; Chest Tube:190]   Physical Exam:  Cardiovascular: RRR Pulmonary: Clear to auscultation bilaterally Abdomen: Soft, non tender, bowel sounds present. Extremities: SCDs in place Wounds: Clean and dry.  No erythema or signs of infection. Chest Tube: to suction, no air leak  Lab Results: CBC: Recent Labs    10/04/18 0249  WBC 9.7  HGB 10.9*  HCT 32.7*  PLT 273   BMET:  Recent Labs    10/04/18 0249  NA 137  K 3.8  CL 105  CO2 24  GLUCOSE 107*  BUN 7*  CREATININE 0.80  CALCIUM 8.0*    PT/INR: No results for input(s): LABPROT, INR in the last 72 hours. ABG:  INR: Will add last result for INR, ABG once components are confirmed Will add last 4 CBG results once components are confirmed  Assessment/Plan:  1. CV - SR in the 80's. 2.  Pulmonary - On 2 liters of oxygen via [place]. Chest tube with 190 cc since surgery. Chest tube is to suction and there is no air leak. CXR this am appears stable (trace right apical pneumothorax). Likely place chest tube to water seal. Encourage incentive spirometer. Await final  path-frozen showed probable carcinoid. Obtain incentive spirometer 3. CBGs 101/111. No history of diabetes so will stop accu checks and SS PRN. 4. Expected ABL anemia-H and H this am 10.9 and 32.7 5. Decrease IVF, remove foley 6. Scheduled Toradol to help with pain  Donielle M ZimmermanPA-C 10/04/2018,7:22 AM [phone]  Patient seen and examined, agree with above Ambulate + SCD for DVT prophylaxis- no heparin or lovenox due to glycerin allergy CT to water seal  Remo Lipps C. Roxan Hockey, MD [facility] [phone]

## 2018-10-04 NOTE — Progress Notes (Signed)
Brief Nutrition Note  Spoke with pt via phone call to room. RD working remotely.  Pt reports feeling well today and that her pain is well-managed.  Discussed pt's gluten-free diet order. Pt reports that she has been following a gluten-free diet for the last 10 years and declines any education regarding the gluten-free diet.  Pt also reports having alpha-gal and an intolerance to onions and garlic ("they cause inflammation in my intestines"). RD will address this via [place] so that pt is not sent any of these items on her meal trays.   Gaynell Face, MS, RD, LDN Inpatient Clinical Dietitian Pager: [phone] Weekend/After Hours: [phone]

## 2018-10-05 ENCOUNTER — Inpatient Hospital Stay (HOSPITAL_COMMUNITY): Payer: Medicare Other

## 2018-10-05 LAB — CBC
HCT: 35.8 % — ABNORMAL LOW (ref 36.0–46.0)
Hemoglobin: 11.8 g/dL — ABNORMAL LOW (ref 12.0–15.0)
MCH: 31.7 pg (ref 26.0–34.0)
MCHC: 33 g/dL (ref 30.0–36.0)
MCV: 96.2 fL (ref 80.0–100.0)
Platelets: 291 10*3/uL (ref 150–400)
RBC: 3.72 MIL/uL — ABNORMAL LOW (ref 3.87–5.11)
RDW: 12.7 % (ref 11.5–15.5)
WBC: 11 10*3/uL — ABNORMAL HIGH (ref 4.0–10.5)
nRBC: 0 % (ref 0.0–0.2)

## 2018-10-05 LAB — COMPREHENSIVE METABOLIC PANEL
ALT: 17 U/L (ref 0–44)
AST: 23 U/L (ref 15–41)
Albumin: 3.3 g/dL — ABNORMAL LOW (ref 3.5–5.0)
Alkaline Phosphatase: 59 U/L (ref 38–126)
Anion gap: 11 (ref 5–15)
BUN: 7 mg/dL — ABNORMAL LOW (ref 8–23)
CO2: 23 mmol/L (ref 22–32)
Calcium: 8.3 mg/dL — ABNORMAL LOW (ref 8.9–10.3)
Chloride: 107 mmol/L (ref 98–111)
Creatinine, Ser: 0.86 mg/dL (ref 0.44–1.00)
GFR calc Af Amer: >60 mL/min (ref >60)
GFR calc non Af Amer: >60 mL/min (ref >60)
Glucose, Bld: 108 mg/dL — ABNORMAL HIGH (ref 70–99)
Potassium: 3.8 mmol/L (ref 3.5–5.1)
Sodium: 141 mmol/L (ref 135–145)
Total Bilirubin: 0.4 mg/dL (ref 0.3–1.2)
Total Protein: 6 g/dL — ABNORMAL LOW (ref 6.5–8.1)

## 2018-10-05 MED ORDER — POTASSIUM CHLORIDE CRYS ER 10 MEQ PO TBCR
30.0000 meq | EXTENDED_RELEASE_TABLET | Freq: Once | ORAL | Status: AC
  Administered 2018-10-05: 30 meq via ORAL
  Filled 2018-10-05: qty 3

## 2018-10-05 MED ORDER — ALUM & MAG HYDROXIDE-SIMETH 200-200-20 MG/5ML PO SUSP
30.0000 mL | Freq: Four times a day (QID) | ORAL | Status: DC | PRN
  Administered 2018-10-05: 30 mL via ORAL
  Filled 2018-10-05: qty 30

## 2018-10-05 NOTE — Progress Notes (Signed)
Ambulated inside the room. Encouraged  to ambulate along  the hallway but refused. Explained the significance of ambulation but insisted not to.

## 2018-10-05 NOTE — Progress Notes (Addendum)
      [address]       [place] [address]             [phone]       2 Days Post-Op Procedure(s) (LRB): VIDEO ASSISTED THORACOSCOPY (VATS)/WEDGE RESECTION with multiple node dissection. (Right)  Subjective: Patient in good spirits as received pathology results from Dr. Roxan Hockey  Objective: Vital signs in last 24 hours: Temp:  [98.2 F (36.8 C)-99.1 F (37.3 C)] 98.6 F (37 C) (09/30 0325) Pulse Rate:  [79-102] 102 (09/29 2338) Cardiac Rhythm: Sinus tachycardia (09/30 0025) Resp:  [13-19] 15 (09/29 2338) BP: (98-128)/(55-69) 116/65 (09/30 0328) SpO2:  [93 %-98 %] 94 % (09/29 2338) FiO2 (%):  [0 %] 0 % (09/29 1200)      Intake/Output from previous day: 09/29 0701 - 09/30 0700 In: 380 [P.O.:270; I.V.:110] Out: 723 [Urine:300; Chest Tube:423]   Physical Exam:  Cardiovascular: RRR Pulmonary: Clear to auscultation bilaterally Abdomen: Soft, non tender, bowel sounds present. Extremities: SCDs in place Wounds: Clean and dry.  No erythema or signs of infection. Chest Tube: to water seal, no air leak  Lab Results: CBC: Recent Labs    10/04/18 0249 10/05/18 0325  WBC 9.7 11.0*  HGB 10.9* 11.8*  HCT 32.7* 35.8*  PLT 273 291   BMET:  Recent Labs    10/04/18 0249 10/05/18 0325  NA 137 141  K 3.8 3.8  CL 105 107  CO2 24 23  GLUCOSE 107* 108*  BUN 7* 7*  CREATININE 0.80 0.86  CALCIUM 8.0* 8.3*    PT/INR: No results for input(s): LABPROT, INR in the last 72 hours. ABG:  INR: Will add last result for INR, ABG once components are confirmed Will add last 4 CBG results once components are confirmed  Assessment/Plan:  1. CV - SR in the 90's. 2.  Pulmonary - On 2 liters of oxygen via [place]. Chest tube with 423 cc last 24 hours. Chest tube is to water seal and there is no air leak. CXR this am appears stable.Likely remove chest tube. Chest tube to remain secondary to amount of drainage. Encourage incentive spirometer. Final path showed  carcinoid. TNM Code: pT1b, pN0. Encourage incentive  spirometer 3. Expected ABL anemia-H and H this am increased to 11.8 and 35.8 4. Remove central line  Donielle M ZimmermanPA-C 10/05/2018,7:28 AM [phone] Patient seen and examined, agree with above Path T1N0, stage IA typical carcinoid  Revonda Standard. Roxan Hockey, MD [facility] [phone]

## 2018-10-05 NOTE — Progress Notes (Signed)
Complained  Of epigastric pain, PA made aware. maalox 30 cc po given. Continue to monitor.

## 2018-10-06 ENCOUNTER — Inpatient Hospital Stay (HOSPITAL_COMMUNITY): Payer: Medicare Other

## 2018-10-06 MED ORDER — ACETAMINOPHEN 325 MG PO TABS: 325.0000 mg | ORAL_TABLET | Freq: Four times a day (QID) | ORAL | Status: DC | PRN

## 2018-10-06 NOTE — Progress Notes (Addendum)
      [address]       [place] [address]             [phone]       3 Days Post-Op Procedure(s) (LRB): VIDEO ASSISTED THORACOSCOPY (VATS)/WEDGE RESECTION with multiple node dissection. (Right)  Subjective: Patient had upset stomach/epigastric discomfort after potassium yesterday. She feels better this am  Objective: Vital signs in last 24 hours: Temp:  [98.5 F (36.9 C)-99.6 F (37.6 C)] 98.5 F (36.9 C) (10/01 0318) Pulse Rate:  [84-101] 84 (10/01 0318) Cardiac Rhythm: Normal sinus rhythm (10/01 0713) Resp:  [15-27] 18 (10/01 0318) BP: (113-131)/(64-79) 113/64 (10/01 0318) SpO2:  [94 %-96 %] 95 % (10/01 0318) FiO2 (%):  [0 %] 0 % (09/30 1200)      Intake/Output from previous day: 09/30 0701 - 10/01 0700 In: 330 [P.O.:220; I.V.:110] Out: 362 [Urine:152; Chest Tube:210]   Physical Exam:  Cardiovascular: RRR Pulmonary: Clear to auscultation bilaterally Abdomen: Soft, non tender, bowel sounds present. Extremities: SCDs in place Wounds: Clean and dry.  No erythema or signs of infection. Chest Tube: to water seal, no air leak  Lab Results: CBC: Recent Labs    10/04/18 0249 10/05/18 0325  WBC 9.7 11.0*  HGB 10.9* 11.8*  HCT 32.7* 35.8*  PLT 273 291   BMET:  Recent Labs    10/04/18 0249 10/05/18 0325  NA 137 141  K 3.8 3.8  CL 105 107  CO2 24 23  GLUCOSE 107* 108*  BUN 7* 7*  CREATININE 0.80 0.86  CALCIUM 8.0* 8.3*    PT/INR: No results for input(s): LABPROT, INR in the last 72 hours. ABG:  INR: Will add last result for INR, ABG once components are confirmed Will add last 4 CBG results once components are confirmed  Assessment/Plan:  1. CV - SR in the 80's. 2.  Pulmonary - On room air. Chest tube with decreased output of 210 cc last 24 hours. Chest tube is to water seal and there is no air leak. CXR this am appears stable.Likely remove chest tube. Hope to remove chest tube soon (trace right apical pneumothorax). Encourage  incentive spirometer.  Encourage incentive  spirometer 3. Expected ABL anemia-H and H this am increased to 11.8 and 35.8 4. Will stop PCA once chest tube removed  Donielle M ZimmermanPA-C 10/06/2018,7:36 AM [phone]  Patient seen and examined, agree with above Dc chest tube  Remo Lipps C. Roxan Hockey, MD [facility] [phone]

## 2018-10-06 NOTE — Care Management Important Message (Signed)
Important Message  Patient Details  Name: Shelly [name] MRN: [id number] Date of Birth: February 15, 1945   Medicare Important Message Given:  Yes     Memory Argue 10/06/2018, 1:35 PM

## 2018-10-07 ENCOUNTER — Inpatient Hospital Stay (HOSPITAL_COMMUNITY): Payer: Medicare Other

## 2018-10-07 MED ORDER — TRAMADOL HCL 50 MG PO TABS: 50.0000 mg | ORAL_TABLET | Freq: Four times a day (QID) | ORAL | 0 refills | Status: DC | PRN

## 2018-10-07 NOTE — Progress Notes (Signed)
Discharge instructions gone over with patient and patient's husband. Both stated that they understood.  IV discontinued and patient is ready for discharge.  Shelly [name] 10:30 AM 10/07/2018

## 2018-10-07 NOTE — Progress Notes (Addendum)
      [address]       [place] [address]             [phone]       4 Days Post-Op Procedure(s) (LRB): VIDEO ASSISTED THORACOSCOPY (VATS)/WEDGE RESECTION with multiple node dissection. (Right)  Subjective: She has no specific complaints this am. Patient hopes to go home.   Objective: Vital signs in last 24 hours: Temp:  [98.2 F (36.8 C)-99.3 F (37.4 C)] 98.8 F (37.1 C) (10/02 0339) Pulse Rate:  [80-102] 80 (10/02 0339) Cardiac Rhythm: Normal sinus rhythm (10/02 0441) Resp:  [16-27] 21 (10/01 2335) BP: (106-122)/(62-75) 119/75 (10/01 2335) SpO2:  [94 %-99 %] 99 % (10/01 2335)      Intake/Output from previous day: 10/01 0701 - 10/02 0700 In: 667.5 [P.O.:530; I.V.:137.5] Out: -    Physical Exam:  Cardiovascular: RRR Pulmonary: Clear to auscultation bilaterally Abdomen: Soft, non tender, bowel sounds present. Extremities: SCDs in place Wounds: Clean and dry.  No erythema or signs of infection.   Lab Results: CBC: Recent Labs    10/05/18 0325  WBC 11.0*  HGB 11.8*  HCT 35.8*  PLT 291   BMET:  Recent Labs    10/05/18 0325  NA 141  K 3.8  CL 107  CO2 23  GLUCOSE 108*  BUN 7*  CREATININE 0.86  CALCIUM 8.3*    PT/INR: No results for input(s): LABPROT, INR in the last 72 hours. ABG:  INR: Will add last result for INR, ABG once components are confirmed Will add last 4 CBG results once components are confirmed  Assessment/Plan:  1. CV - SR. 2.  Pulmonary - On room air. CXR this am appears stable. Encourage incentive spirometer.  Encourage incentive  spirometer 3. Expected ABL anemia-Last H and H stable at 11.8 and 35.8 4. Discharge  Sharalyn Ink ZimmermanPA-C 10/07/2018,7:03 AM [phone] Patient seen and examined, agree with above Dc home today  Revonda Standard. Roxan Hockey, MD [facility] [phone]

## 2018-10-13 ENCOUNTER — Ambulatory Visit (INDEPENDENT_AMBULATORY_CARE_PROVIDER_SITE_OTHER): Payer: Self-pay

## 2018-10-13 ENCOUNTER — Other Ambulatory Visit: Payer: Self-pay

## 2018-10-13 ENCOUNTER — Other Ambulatory Visit: Payer: Self-pay | Admitting: *Deleted

## 2018-10-13 DIAGNOSIS — Z4802 Encounter for removal of sutures: Secondary | ICD-10-CM

## 2018-10-13 NOTE — Progress Notes (Signed)
Removed 1 suture from chest tube incision site with no signs of infection and patient tolerated well. She is scheduled to see Dr Roxan Hockey on 10/25/18 with CXR.

## 2018-10-13 NOTE — Progress Notes (Signed)
The proposed treatment discussed in cancer conference 10/13/18 is for discussion purpose only and is not a binding recommendation.  The patient was not physically examined nor present for their treatment options.  Therefore, final treatment plans cannot be decided.

## 2018-10-24 ENCOUNTER — Other Ambulatory Visit: Payer: Self-pay | Admitting: Thoracic Surgery (Cardiothoracic Vascular Surgery)

## 2018-10-24 DIAGNOSIS — R911 Solitary pulmonary nodule: Secondary | ICD-10-CM

## 2018-10-25 ENCOUNTER — Encounter: Payer: Self-pay | Admitting: Thoracic Surgery (Cardiothoracic Vascular Surgery)

## 2018-10-25 ENCOUNTER — Other Ambulatory Visit: Payer: Self-pay

## 2018-10-25 ENCOUNTER — Encounter: Payer: Self-pay | Admitting: *Deleted

## 2018-10-25 ENCOUNTER — Ambulatory Visit
Admission: RE | Admit: 2018-10-25 | Discharge: 2018-10-25 | Disposition: A | Discharge status: Home / Self Care | Payer: Medicare Other | Source: Ambulatory Visit | Attending: Thoracic Surgery (Cardiothoracic Vascular Surgery) | Admitting: Thoracic Surgery (Cardiothoracic Vascular Surgery)

## 2018-10-25 ENCOUNTER — Ambulatory Visit (INDEPENDENT_AMBULATORY_CARE_PROVIDER_SITE_OTHER): Payer: Self-pay | Admitting: Thoracic Surgery (Cardiothoracic Vascular Surgery)

## 2018-10-25 VITALS — BP 116/56 | HR 105 | Temp 97.7°F | Resp 16 | Ht 64.5 in | Wt 130.8 lb

## 2018-10-25 DIAGNOSIS — R911 Solitary pulmonary nodule: Secondary | ICD-10-CM

## 2018-10-25 DIAGNOSIS — Z09 Encounter for follow-up examination after completed treatment for conditions other than malignant neoplasm: Secondary | ICD-10-CM

## 2018-10-25 NOTE — Progress Notes (Signed)
[address]       [place] [address]             [phone]    HPI: Mrs. Bialas returns for a scheduled follow-up visit  Shelly [name] is a 73 year old woman with a history of tobacco use, arthritis, gluten allergy, and alpha gal.  She was found to have multiple lung nodules back in 2010.  These were followed for a couple of years and did not change in size.  Recently she presented with a cough.  A CT of the chest in February showed an increase in size of a right upper lobe nodule.  A follow-up PET CT in July showed the right upper lobe nodule was hypermetabolic with an SUV of 6.  Multiple other nodules were not hypermetabolic.  I did a right VATS for wedge resection and node sampling on 10/03/2018.  The nodule turned out to be a typical carcinoid tumor.  The nodes were negative.  Her postoperative course was uncomplicated and she went home on day 4.  She says that she has not been taking any narcotics.  She does not have pain per se but she has a sensation like she is wearing a wall garment over her right chest.  There is a hypersensitivity of the skin.  She says she feels like her respiratory capacity is not what it was preop.  Past Medical History:  Diagnosis Date  . Allergic rhinitis   . Arthritis    right hand  . Hypotension   . Transient gluten sensitivity    diarrhea and stomaCH pains    Current Outpatient Medications  Medication Sig Dispense Refill  . acetaminophen (TYLENOL) 500 MG tablet Take 1,000 mg by mouth every 6 (six) hours as needed (headaches.).     Marland Kitchen cetirizine (ZYRTEC) 10 MG tablet Take 5 mg by mouth daily as needed for allergies.    Marland Kitchen ibuprofen (ADVIL,MOTRIN) 200 MG tablet Take 200 mg by mouth every 6 (six) hours as needed for mild pain (muscle pain.).     Marland Kitchen ipratropium (ATROVENT) 0.06 % nasal spray Place 2 sprays into the nose 4 (four) times daily as needed (congestion.).     Marland Kitchen pseudoephedrine (SUDAFED) 30 MG tablet Take 15 mg by mouth  every 4 (four) hours as needed for congestion.      No current facility-administered medications for this visit.     Physical Exam BP (!) 116/56 (BP Location: Right Arm, Patient Position: Sitting, Cuff Size: Normal)   Pulse (!) 105   Temp 97.7 F (36.5 C)   Resp 16   Ht 5' 4.5" (1.638 m)   Wt 130 lb 12.8 oz (59.3 kg)   SpO2 96% Comment: ON RA  BMI 22.33 kg/m  73 year old woman in no acute distress Alert and oriented x3 with no focal deficits Lungs clear with equal breath sounds bilaterally Cardiac regular rate and rhythm normal S1-S2 Incisions healing well No peripheral edema  Diagnostic Tests: CHEST - 2 VIEW  COMPARISON:  10/07/2018, 10/06/2018, CT 03/04/2018  FINDINGS: Resolved small right apical pneumothorax. Postsurgical changes in the right upper lung. No acute consolidation or pleural effusion. Normal heart size. Clips in the right upper quadrant.  IMPRESSION: No active cardiopulmonary disease. Resolved right apical pneumothorax. Postsurgical changes on the right.   Electronically Signed   By: Donavan Foil M.D.   On: 10/25/2018 15:11  I personally reviewed the chest x-ray images and concur with the findings noted above  Impression: Shelly [name] is a 72 year old woman with a history of tobacco abuse who was found to have a right upper lobe lung nodule back in February.  On PET CT in July the nodule was slightly larger and hypermetabolic with an SUV of 6.  I did a right VATS for wedge resection about 3 weeks ago.  The nodule turned out to be a T1, N0 typical carcinoid tumor.  Her postoperative course was uncomplicated.  She has some cutaneous hypersensitivity around the incision.  That will improve with time.  She says that putting something cold on it often helps I told her she can put ice packs on there if she would like.  She may begin driving.  Appropriate precautions were discussed.  There are no restrictions on her activities but she was advised  to build into new activities gradually to avoid undue discomfort.  I discussed the nature of carcinoid tumors with her.  She understands that they are form of cancer although hers was typical so there is a relatively low recurrence rate.  The only concerning feature about it was its activity on the PET/CT.  She will need follow-up after 5 years.  Plan: We will refer to Dr. Julien Nordmann at the multidisciplinary thoracic oncology clinic Return in 2 months with PA and lateral chest x-ray  Melrose Nakayama, MD [facility] [phone]

## 2018-10-25 NOTE — Progress Notes (Signed)
Oncology Nurse Navigator Documentation  Oncology Nurse Navigator Flowsheets 10/25/2018  Diagnosis Status Confirmed Diagnosis Complete  Navigator Follow Up Date: 11/14/2018  Navigator Follow Up Reason: New Patient Appointment  Navigator Location [facility]  Referral Date to RadOnc/MedOnc 10/25/2018  Navigator Encounter Type Other/I received referral on Shelly [name] today.  I updated new patient coordinator to call and schedule her to be seen 11/14/18 with Dr. Julien Nordmann.   Abnormal Finding Date 02/23/2018  Confirmed Diagnosis Date 10/03/2018  Expected Surgery Date 10/03/2018  Barriers/Navigation Needs Coordination of Care  Interventions Coordination of Care  Coordination of Care Other  Acuity Level 2-Minimal Needs (1-2 Barriers Identified)  Time Spent with Patient 30

## 2018-11-01 ENCOUNTER — Telehealth: Payer: Self-pay | Admitting: Internal Medicine

## 2018-11-01 NOTE — Telephone Encounter (Signed)
Shelly [name] has been cld and provided the appt to see Dr. Julien Nordmann on 11/9 at 2:15pm w/labs at 1:45pm. She's been made aware to arrive 15 minutes early.

## 2018-11-11 ENCOUNTER — Other Ambulatory Visit: Payer: Self-pay | Admitting: Physician Assistant

## 2018-11-11 DIAGNOSIS — R42 Dizziness and giddiness: Secondary | ICD-10-CM | POA: Diagnosis not present

## 2018-11-11 DIAGNOSIS — R911 Solitary pulmonary nodule: Secondary | ICD-10-CM

## 2018-11-11 DIAGNOSIS — J019 Acute sinusitis, unspecified: Secondary | ICD-10-CM | POA: Diagnosis not present

## 2018-11-14 ENCOUNTER — Encounter: Payer: Self-pay | Admitting: Internal Medicine

## 2018-11-14 ENCOUNTER — Inpatient Hospital Stay: Payer: Medicare Other

## 2018-11-14 ENCOUNTER — Other Ambulatory Visit: Payer: Self-pay

## 2018-11-14 ENCOUNTER — Other Ambulatory Visit: Payer: Self-pay | Admitting: Medical Oncology

## 2018-11-14 ENCOUNTER — Inpatient Hospital Stay: Payer: Medicare Other | Attending: Internal Medicine | Admitting: Internal Medicine

## 2018-11-14 VITALS — BP 114/72 | HR 98 | Temp 98.7°F | Resp 18 | Ht 64.5 in | Wt 130.5 lb

## 2018-11-14 DIAGNOSIS — R918 Other nonspecific abnormal finding of lung field: Secondary | ICD-10-CM

## 2018-11-14 DIAGNOSIS — I959 Hypotension, unspecified: Secondary | ICD-10-CM | POA: Diagnosis not present

## 2018-11-14 DIAGNOSIS — E039 Hypothyroidism, unspecified: Secondary | ICD-10-CM

## 2018-11-14 DIAGNOSIS — R911 Solitary pulmonary nodule: Secondary | ICD-10-CM

## 2018-11-14 DIAGNOSIS — F17210 Nicotine dependence, cigarettes, uncomplicated: Secondary | ICD-10-CM

## 2018-11-14 DIAGNOSIS — C7A090 Malignant carcinoid tumor of the bronchus and lung: Secondary | ICD-10-CM | POA: Diagnosis not present

## 2018-11-14 LAB — CMP (CANCER CENTER ONLY)
ALT: 12 U/L (ref 0–44)
AST: 15 U/L (ref 15–41)
Albumin: 4.2 g/dL (ref 3.5–5.0)
Alkaline Phosphatase: 73 U/L (ref 38–126)
Anion gap: 11 (ref 5–15)
BUN: 17 mg/dL (ref 8–23)
CO2: 26 mmol/L (ref 22–32)
Calcium: 9.3 mg/dL (ref 8.9–10.3)
Chloride: 104 mmol/L (ref 98–111)
Creatinine: 0.95 mg/dL (ref 0.44–1.00)
GFR, Est AFR Am: >60 mL/min (ref >60)
GFR, Estimated: 60 mL/min — ABNORMAL LOW (ref >60)
Glucose, Bld: 96 mg/dL (ref 70–99)
Potassium: 3.9 mmol/L (ref 3.5–5.1)
Sodium: 141 mmol/L (ref 135–145)
Total Bilirubin: 0.3 mg/dL (ref 0.3–1.2)
Total Protein: 7.6 g/dL (ref 6.5–8.1)

## 2018-11-14 LAB — CBC WITH DIFFERENTIAL (CANCER CENTER ONLY)
Abs Immature Granulocytes: 0.05 10*3/uL (ref 0.00–0.07)
Basophils Absolute: 0.1 10*3/uL (ref 0.0–0.1)
Basophils Relative: 1 %
Eosinophils Absolute: 0.2 10*3/uL (ref 0.0–0.5)
Eosinophils Relative: 3 %
HCT: 37.3 % (ref 36.0–46.0)
Hemoglobin: 12.3 g/dL (ref 12.0–15.0)
Immature Granulocytes: 1 %
Lymphocytes Relative: 26 %
Lymphs Abs: 1.9 10*3/uL (ref 0.7–4.0)
MCH: 30.8 pg (ref 26.0–34.0)
MCHC: 33 g/dL (ref 30.0–36.0)
MCV: 93.5 fL (ref 80.0–100.0)
Monocytes Absolute: 0.5 10*3/uL (ref 0.1–1.0)
Monocytes Relative: 7 %
Neutro Abs: 4.8 10*3/uL (ref 1.7–7.7)
Neutrophils Relative %: 62 %
Platelet Count: 397 10*3/uL (ref 150–400)
RBC: 3.99 MIL/uL (ref 3.87–5.11)
RDW: 12.4 % (ref 11.5–15.5)
WBC Count: 7.6 10*3/uL (ref 4.0–10.5)
nRBC: 0 % (ref 0.0–0.2)

## 2018-11-14 NOTE — Progress Notes (Signed)
Shelly [name] Telephone:([phone]) [phone]   Fax:(336) [phone]  CONSULT NOTE  REFERRING PHYSICIAN: Dr. Modesto Charon  REASON FOR CONSULTATION:  73 years old white female recently diagnosed with carcinoid tumor.  HPI Shelly [name] is a 73 y.o. female with past medical history significant for allergic rhinitis, hypothyroidism, osteoarthritis as well as long history for smoking.  The patient has a history of multiple lung nodules since 2010.  She was followed by her primary care physician with some imaging studies every few years.  On March 04, 2018 she had CT scan of the chest and it showed slowly enlarging right upper lobe nodule suspicious to be benign there was also right middle and right lower lobe stable nodules and a new 0.6 cm left upper lobe nodule.  This was followed by a PET scan on 07/19/2018 and the dominant right upper lobe nodule measured 1.5 x 0.9 cm with SUV max of 6.0.  The patient was referred to Dr. Melvyn Novas and then Dr. Roxan Hockey.  On October 03, 2018 the patient underwent right VATS with wedge resection of the right upper lobe nodule and lymph node sampling under the care of Dr. Roxan Hockey. The final pathology ([id number]) showed typical carcinoid spanning 1.5 cm with the tumor limited to the lung and there was negative resection margin.  The dissected lymph nodes were negative for malignancy. The patient was referred to me today for evaluation and recommendation regarding further management of her condition.  She is currently on the back because of vertigo.  She has a history of allergic rhinitis.  She denied having any current chest pain but has some soreness with numbness after the surgery.  She denied having any shortness of breath, cough or hemoptysis.  She denied having any nausea, vomiting, diarrhea or constipation.  She has no headache or visual changes. Family history significant for father with prostate cancer and COPD, mother had a stroke. The  patient is single and has 2 sons one in [place] and the other one in [place].  She was accompanied by her significant other Tom.  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  She has a history for smoking less than 1 pack/day for around 55 years and quit 10/02/2018.  She has no history of alcohol or drug abuse.   HPI  Past Medical History:  Diagnosis Date  . Allergic rhinitis   . Arthritis    right hand  . Hypotension   . Transient gluten sensitivity    diarrhea and stomaCH pains    Past Surgical History:  Procedure Laterality Date  . EUS N/A 06/28/2013   Procedure: ESOPHAGEAL ENDOSCOPIC ULTRASOUND (EUS) RADIAL;  Surgeon: Arta Silence, MD;  Location: [facility];  Service: Endoscopy;  Laterality: N/A;  . GALLBLADDER SURGERY  2005  . GANGLION CYST EXCISION Left 2003  . TUBAL LIGATION  1981  . VIDEO ASSISTED THORACOSCOPY (VATS)/WEDGE RESECTION Right 10/03/2018   Procedure: VIDEO ASSISTED THORACOSCOPY (VATS)/WEDGE RESECTION with multiple node dissection.;  Surgeon: Melrose Nakayama, MD;  Location: [facility] OR;  Service: Thoracic;  Laterality: Right;    Family History  Problem Relation Age of Onset  . Prostate cancer Father   . Emphysema Father     Social History Social History   Tobacco Use  . Smoking status: Current Every Day Smoker    Packs/day: 0.25    Years: 57.00    Pack years: 14.25    Types: Cigarettes  . Smokeless tobacco: Never Used  Substance  Use Topics  . Alcohol use: No  . Drug use: No    Allergies  Allergen Reactions  . Alpha-Gal Anaphylaxis    Throat swelling.  . Gelatin Anaphylaxis  . Gluten Diarrhea and Nausea Only  . Hyoscyamine Sulfate Nausea Only  . Clindamycin Rash  . Cortisone Rash  . Penicillins Rash    Did it involve swelling of the face/tongue/throat, SOB, or low BP? No Did it involve sudden or severe rash/hives, skin peeling, or any reaction on the inside of your mouth or nose? Yes Did you need to seek medical attention at a  hospital or doctor's office? No When did it last happen?5 years ago (~2015) If all above answers are "NO", may proceed with cephalosporin use.   . Prednisone Rash  . Sulfonamide Derivatives Rash    Current Outpatient Medications  Medication Sig Dispense Refill  . azithromycin (ZITHROMAX) 250 MG tablet Take 1 tablet by mouth daily.    . meclizine (ANTIVERT) 25 MG tablet Take 12.5-25 mg by mouth 2 (two) times daily as needed.    Marland Kitchen acetaminophen (TYLENOL) 500 MG tablet Take 1,000 mg by mouth every 6 (six) hours as needed (headaches.).     Marland Kitchen cetirizine (ZYRTEC) 10 MG tablet Take 5 mg by mouth daily as needed for allergies.    Marland Kitchen ibuprofen (ADVIL,MOTRIN) 200 MG tablet Take 200 mg by mouth every 6 (six) hours as needed for mild pain (muscle pain.).     Marland Kitchen ipratropium (ATROVENT) 0.06 % nasal spray Place 2 sprays into the nose 4 (four) times daily as needed (congestion.).     Marland Kitchen pseudoephedrine (SUDAFED) 30 MG tablet Take 15 mg by mouth every 4 (four) hours as needed for congestion.      No current facility-administered medications for this visit.     Review of Systems  Constitutional: negative Eyes: negative Ears, nose, mouth, throat, and face: negative Respiratory: positive for pleurisy/chest pain Cardiovascular: negative Gastrointestinal: negative Genitourinary:negative Integument/breast: negative Hematologic/lymphatic: negative Musculoskeletal:negative Neurological: negative Behavioral/Psych: negative Endocrine: negative Allergic/Immunologic: negative  Physical Exam  NIO:EVOJJ, healthy, no distress, well nourished and well developed SKIN: skin color, texture, turgor are normal, no rashes or significant lesions HEAD: Normocephalic, No masses, lesions, tenderness or abnormalities EYES: normal, PERRLA, Conjunctiva are pink and non-injected EARS: External ears normal, Canals clear OROPHARYNX:no exudate, no erythema and lips, buccal mucosa, and tongue normal  NECK: supple, no  adenopathy, no JVD LYMPH:  no palpable lymphadenopathy, no hepatosplenomegaly BREAST:not examined LUNGS: clear to auscultation , and palpation HEART: regular rate & rhythm, no murmurs and no gallops ABDOMEN:abdomen soft, non-tender, normal bowel sounds and no masses or organomegaly BACK: No CVA tenderness, Range of motion is normal EXTREMITIES:no joint deformities, effusion, or inflammation, no edema  NEURO: alert & oriented x 3 with fluent speech, no focal motor/sensory deficits  PERFORMANCE STATUS: ECOG 1  LABORATORY DATA: Lab Results  Component Value Date   WBC 7.6 11/14/2018   HGB 12.3 11/14/2018   HCT 37.3 11/14/2018   MCV 93.5 11/14/2018   PLT 397 11/14/2018      Chemistry      Component Value Date/Time   NA 141 10/05/2018 0325   K 3.8 10/05/2018 0325   CL 107 10/05/2018 0325   CO2 23 10/05/2018 0325   BUN 7 (L) 10/05/2018 0325   CREATININE 0.86 10/05/2018 0325      Component Value Date/Time   CALCIUM 8.3 (L) 10/05/2018 0325   ALKPHOS 59 10/05/2018 0325   AST 23 10/05/2018 0325  ALT 17 10/05/2018 0325   BILITOT 0.4 10/05/2018 0325       RADIOGRAPHIC STUDIES: Dg Chest 2 View  Result Date: 10/25/2018 CLINICAL DATA:  Nodule of right upper lobe status post VATS and wedge resection EXAM: CHEST - 2 VIEW COMPARISON:  10/07/2018, 10/06/2018, CT 03/04/2018 FINDINGS: Resolved small right apical pneumothorax. Postsurgical changes in the right upper lung. No acute consolidation or pleural effusion. Normal heart size. Clips in the right upper quadrant. IMPRESSION: No active cardiopulmonary disease. Resolved right apical pneumothorax. Postsurgical changes on the right. Electronically Signed   By: Donavan Foil M.D.   On: 10/25/2018 15:11    ASSESSMENT: This is a very pleasant 73 years old white female recently diagnosed with a stage Ia (T1b, N0, M0) typical carcinoid tumor status post wedge resection of right upper lobectomy with lymph node sampling under the care of Dr.  Roxan Hockey on 10/03/2018.  The patient has multiple other pulmonary nodules that need close monitoring and observation.   PLAN: I had a lengthy discussion with the patient and her boyfriend today about her current disease status, prognosis and treatment options. I explained to the patient that there is no benefit for adjuvant systemic therapy for patient with a stage Ia especially with carcinoid tumor. I recommended for the patient to continue on observation with repeat CT scan of the chest in 6 months. For the smoking cessation, I strongly encouraged the patient to continue quitting smoking and to ask for help if needed. She was advised to call immediately if she has any other concerning symptoms in the interval. The patient voices understanding of current disease status and treatment options and is in agreement with the current care plan.  All questions were answered. The patient knows to call the clinic with any problems, questions or concerns. We can certainly see the patient much sooner if necessary.  Thank you so much for allowing me to participate in the care of Shelly [name]. I will continue to follow up the patient with you and assist in her care.  I spent 40 minutes counseling the patient face to face. The total time spent in the appointment was 60 minutes.  Disclaimer: This note was dictated with voice recognition software. Similar sounding words can inadvertently be transcribed and may not be corrected upon review.   Eilleen Kempf November 14, 2018, 2:15 PM

## 2018-11-15 ENCOUNTER — Telehealth: Payer: Self-pay | Admitting: Internal Medicine

## 2018-11-15 NOTE — Telephone Encounter (Signed)
Scheduled per los. Called and spoke with patient. Confirmed appt 

## 2018-12-21 ENCOUNTER — Other Ambulatory Visit: Payer: Self-pay | Admitting: Thoracic Surgery (Cardiothoracic Vascular Surgery)

## 2018-12-21 DIAGNOSIS — R911 Solitary pulmonary nodule: Secondary | ICD-10-CM

## 2018-12-27 ENCOUNTER — Other Ambulatory Visit: Payer: Self-pay

## 2018-12-27 ENCOUNTER — Ambulatory Visit
Admission: RE | Admit: 2018-12-27 | Discharge: 2018-12-27 | Disposition: A | Discharge status: Home / Self Care | Payer: Medicare Other | Source: Ambulatory Visit | Attending: Thoracic Surgery (Cardiothoracic Vascular Surgery) | Admitting: Thoracic Surgery (Cardiothoracic Vascular Surgery)

## 2018-12-27 ENCOUNTER — Ambulatory Visit (INDEPENDENT_AMBULATORY_CARE_PROVIDER_SITE_OTHER): Payer: Self-pay | Admitting: Thoracic Surgery (Cardiothoracic Vascular Surgery)

## 2018-12-27 ENCOUNTER — Encounter: Payer: Self-pay | Admitting: Thoracic Surgery (Cardiothoracic Vascular Surgery)

## 2018-12-27 VITALS — BP 113/74 | HR 78 | Temp 97.5°F | Resp 16 | Ht 64.5 in | Wt 130.0 lb

## 2018-12-27 DIAGNOSIS — R911 Solitary pulmonary nodule: Secondary | ICD-10-CM

## 2018-12-27 DIAGNOSIS — J439 Emphysema, unspecified: Secondary | ICD-10-CM | POA: Diagnosis not present

## 2018-12-27 DIAGNOSIS — C7A090 Malignant carcinoid tumor of the bronchus and lung: Secondary | ICD-10-CM

## 2018-12-27 DIAGNOSIS — Z09 Encounter for follow-up examination after completed treatment for conditions other than malignant neoplasm: Secondary | ICD-10-CM

## 2018-12-27 NOTE — Progress Notes (Signed)
[address]       [place] [address]             [phone]       HPI: Shelly [name] returns for a scheduled follow-up visit  Shelly [name] is a 73 year old woman with history of tobacco abuse, arthritis, gluten allergy, and alpha gal.  She was first noted to have lung nodules back in 2010.  She had multiple nodules at that time.  Those were followed for couple years and did not change in size.  Recently she presented with a cough.  A CT of the chest showed right upper lobe nodule had increased in size.  On follow-up PET the nodule was hypermetabolic with an SUV of 6.  The other nodules were unchanged and were not hypermetabolic.  I recommended we do a VATS for wedge resection for definitive diagnosis.  We did that on 10/03/2018.  It turned out to be a typical carcinoid tumor.  Nodes were negative.  Her postoperative course was uncomplicated and she went home on day 4.  I saw her in the office on 10/25/2018.  She was having some paresthesias and had some cutaneous hypersensitivity related to the incision but was not taking any narcotics.  She says that has improved but she does note that if she sleeps on her right side that she has pain at the incision.  She is not having any respiratory issues.  She did quit smoking.  Past Medical History:  Diagnosis Date  . Allergic rhinitis   . Arthritis    right hand  . Hypotension   . Transient gluten sensitivity    diarrhea and stomaCH pains    Current Outpatient Medications  Medication Sig Dispense Refill  . acetaminophen (TYLENOL) 500 MG tablet Take 1,000 mg by mouth every 6 (six) hours as needed (headaches.).     Marland Kitchen cetirizine (ZYRTEC) 10 MG tablet Take 5 mg by mouth daily as needed for allergies.    Marland Kitchen ibuprofen (ADVIL,MOTRIN) 200 MG tablet Take 200 mg by mouth every 6 (six) hours as needed for mild pain (muscle pain.).     Marland Kitchen ipratropium (ATROVENT) 0.06 % nasal spray Place 2 sprays into the nose 4 (four) times daily as  needed (congestion.).     Marland Kitchen meclizine (ANTIVERT) 25 MG tablet Take 12.5-25 mg by mouth 2 (two) times daily as needed.    . pseudoephedrine (SUDAFED) 30 MG tablet Take 15 mg by mouth every 4 (four) hours as needed for congestion.      No current facility-administered medications for this visit.    Physical Exam BP 113/74 (BP Location: Right Arm, Patient Position: Sitting, Cuff Size: Normal)   Pulse 78   Temp (!) 97.5 F (36.4 C)   Resp 16   Ht 5' 4.5" (1.638 m)   Wt 130 lb (59 kg)   SpO2 96% Comment: RA  BMI 21.65 kg/m  73 year old woman in no acute distress Alert and oriented x3 with no focal deficits Lungs clear with equal breath sounds bilaterally Incisions well-healed Cardiac regular rate and rhythm  Diagnostic Tests: CHEST - 2 VIEW  COMPARISON:  CT chest 03/04/2018.  PA and lateral chest 10/25/2018.  FINDINGS: Mild volume loss in the right chest is again seen consistent with prior surgery. The lungs are emphysematous but clear. No pneumothorax or pleural fluid. Heart size is normal. No acute or focal bony abnormality.  IMPRESSION: No acute disease.  Emphysema.   Electronically Signed  By: Inge Rise M.D.   On: 12/27/2018 13:41 I personally reviewed the chest x-ray images and concur with the findings noted above  Impression: Shelly [name] is a 73 year old woman with a history of tobacco abuse who has had known multiple small lung nodules dating back to 2010.  She recently presented with a cough and a follow-up CT showed an increase in size of a right lung nodule.  That nodule was hypermetabolic on PET/CT.  We did a right VATS for wedge resection and node sampling.  It turned out to be a stage Ia typical carcinoid tumor.  From a surgical standpoint she is doing well.  She does notice that it hurts when she lies on that side.  That is pretty typical for this type of operation.  That should improve with time but may never go away completely.  Her  paresthesias have improved significantly.  She saw Dr. Julien Nordmann.  No adjuvant therapy was indicated.  She has a follow-up appointment with him in 6 months.  Plan: Follow-up as scheduled with Dr. Julien Nordmann Return in 9 months with PA and lateral chest x-ray for 1 year follow-up  Melrose Nakayama, MD [facility] [phone]

## 2019-02-01 DIAGNOSIS — H349 Unspecified retinal vascular occlusion: Secondary | ICD-10-CM | POA: Diagnosis not present

## 2019-03-01 DIAGNOSIS — R109 Unspecified abdominal pain: Secondary | ICD-10-CM | POA: Diagnosis not present

## 2019-03-01 DIAGNOSIS — R110 Nausea: Secondary | ICD-10-CM | POA: Diagnosis not present

## 2019-03-03 DIAGNOSIS — R140 Abdominal distension (gaseous): Secondary | ICD-10-CM | POA: Diagnosis not present

## 2019-03-03 DIAGNOSIS — R12 Heartburn: Secondary | ICD-10-CM | POA: Diagnosis not present

## 2019-03-03 DIAGNOSIS — R1310 Dysphagia, unspecified: Secondary | ICD-10-CM | POA: Diagnosis not present

## 2019-03-03 DIAGNOSIS — R6881 Early satiety: Secondary | ICD-10-CM | POA: Diagnosis not present

## 2019-03-07 DIAGNOSIS — Z1159 Encounter for screening for other viral diseases: Secondary | ICD-10-CM | POA: Diagnosis not present

## 2019-03-09 DIAGNOSIS — R12 Heartburn: Secondary | ICD-10-CM | POA: Diagnosis not present

## 2019-03-09 DIAGNOSIS — R1310 Dysphagia, unspecified: Secondary | ICD-10-CM | POA: Diagnosis not present

## 2019-03-09 DIAGNOSIS — R140 Abdominal distension (gaseous): Secondary | ICD-10-CM | POA: Diagnosis not present

## 2019-03-09 DIAGNOSIS — K319 Disease of stomach and duodenum, unspecified: Secondary | ICD-10-CM | POA: Diagnosis not present

## 2019-03-09 DIAGNOSIS — K219 Gastro-esophageal reflux disease without esophagitis: Secondary | ICD-10-CM | POA: Diagnosis not present

## 2019-03-09 DIAGNOSIS — K3189 Other diseases of stomach and duodenum: Secondary | ICD-10-CM | POA: Diagnosis not present

## 2019-03-09 DIAGNOSIS — R1010 Upper abdominal pain, unspecified: Secondary | ICD-10-CM | POA: Diagnosis not present

## 2019-03-09 DIAGNOSIS — R1013 Epigastric pain: Secondary | ICD-10-CM | POA: Diagnosis not present

## 2019-03-09 DIAGNOSIS — R6881 Early satiety: Secondary | ICD-10-CM | POA: Diagnosis not present

## 2019-03-14 ENCOUNTER — Other Ambulatory Visit: Payer: Self-pay | Admitting: Gastroenterology

## 2019-03-14 ENCOUNTER — Other Ambulatory Visit: Payer: Self-pay

## 2019-03-14 ENCOUNTER — Other Ambulatory Visit: Payer: Medicare Other

## 2019-03-14 ENCOUNTER — Ambulatory Visit
Admission: RE | Admit: 2019-03-14 | Discharge: 2019-03-14 | Disposition: A | Discharge status: Home / Self Care | Payer: Medicare Other | Source: Ambulatory Visit | Attending: Gastroenterology | Admitting: Gastroenterology

## 2019-03-14 DIAGNOSIS — R110 Nausea: Secondary | ICD-10-CM

## 2019-03-14 DIAGNOSIS — K219 Gastro-esophageal reflux disease without esophagitis: Secondary | ICD-10-CM | POA: Diagnosis not present

## 2019-03-14 DIAGNOSIS — R197 Diarrhea, unspecified: Secondary | ICD-10-CM | POA: Diagnosis not present

## 2019-03-14 DIAGNOSIS — R109 Unspecified abdominal pain: Secondary | ICD-10-CM

## 2019-03-14 DIAGNOSIS — K319 Disease of stomach and duodenum, unspecified: Secondary | ICD-10-CM | POA: Diagnosis not present

## 2019-03-14 MED ORDER — IOPAMIDOL (ISOVUE-300) INJECTION 61%
100.0000 mL | Freq: Once | INTRAVENOUS | Status: AC | PRN
  Administered 2019-03-14: 100 mL via INTRAVENOUS

## 2019-03-21 DIAGNOSIS — E782 Mixed hyperlipidemia: Secondary | ICD-10-CM | POA: Diagnosis not present

## 2019-03-21 DIAGNOSIS — M810 Age-related osteoporosis without current pathological fracture: Secondary | ICD-10-CM | POA: Diagnosis not present

## 2019-03-21 DIAGNOSIS — Z0000 Encounter for general adult medical examination without abnormal findings: Secondary | ICD-10-CM | POA: Diagnosis not present

## 2019-03-21 DIAGNOSIS — R1013 Epigastric pain: Secondary | ICD-10-CM | POA: Diagnosis not present

## 2019-04-12 DIAGNOSIS — J300 Vasomotor rhinitis: Secondary | ICD-10-CM | POA: Diagnosis not present

## 2019-04-12 DIAGNOSIS — Z91018 Allergy to other foods: Secondary | ICD-10-CM | POA: Diagnosis not present

## 2019-04-18 DIAGNOSIS — Z91018 Allergy to other foods: Secondary | ICD-10-CM | POA: Diagnosis not present

## 2019-04-23 DIAGNOSIS — Z20822 Contact with and (suspected) exposure to covid-19: Secondary | ICD-10-CM | POA: Diagnosis not present

## 2019-04-23 DIAGNOSIS — Z20828 Contact with and (suspected) exposure to other viral communicable diseases: Secondary | ICD-10-CM | POA: Diagnosis not present

## 2019-04-28 DIAGNOSIS — K6389 Other specified diseases of intestine: Secondary | ICD-10-CM | POA: Diagnosis not present

## 2019-04-28 DIAGNOSIS — R195 Other fecal abnormalities: Secondary | ICD-10-CM | POA: Diagnosis not present

## 2019-04-28 DIAGNOSIS — R140 Abdominal distension (gaseous): Secondary | ICD-10-CM | POA: Diagnosis not present

## 2019-04-28 DIAGNOSIS — R197 Diarrhea, unspecified: Secondary | ICD-10-CM | POA: Diagnosis not present

## 2019-04-28 DIAGNOSIS — K5900 Constipation, unspecified: Secondary | ICD-10-CM | POA: Diagnosis not present

## 2019-04-28 DIAGNOSIS — Z9049 Acquired absence of other specified parts of digestive tract: Secondary | ICD-10-CM | POA: Diagnosis not present

## 2019-05-01 DIAGNOSIS — Z1231 Encounter for screening mammogram for malignant neoplasm of breast: Secondary | ICD-10-CM | POA: Diagnosis not present

## 2019-05-01 DIAGNOSIS — M810 Age-related osteoporosis without current pathological fracture: Secondary | ICD-10-CM | POA: Diagnosis not present

## 2019-05-01 DIAGNOSIS — M8589 Other specified disorders of bone density and structure, multiple sites: Secondary | ICD-10-CM | POA: Diagnosis not present

## 2019-05-04 DIAGNOSIS — R140 Abdominal distension (gaseous): Secondary | ICD-10-CM | POA: Diagnosis not present

## 2019-05-04 DIAGNOSIS — R110 Nausea: Secondary | ICD-10-CM | POA: Diagnosis not present

## 2019-05-04 DIAGNOSIS — R1013 Epigastric pain: Secondary | ICD-10-CM | POA: Diagnosis not present

## 2019-05-12 ENCOUNTER — Ambulatory Visit (HOSPITAL_COMMUNITY)
Admission: RE | Admit: 2019-05-12 | Discharge: 2019-05-12 | Disposition: A | Discharge status: Home / Self Care | Payer: Medicare Other | Source: Ambulatory Visit | Attending: Internal Medicine | Admitting: Internal Medicine

## 2019-05-12 ENCOUNTER — Inpatient Hospital Stay: Payer: Medicare Other | Attending: Internal Medicine

## 2019-05-12 ENCOUNTER — Encounter (HOSPITAL_COMMUNITY): Payer: Self-pay

## 2019-05-12 ENCOUNTER — Other Ambulatory Visit: Payer: Self-pay

## 2019-05-12 DIAGNOSIS — E340 Carcinoid syndrome: Secondary | ICD-10-CM | POA: Diagnosis not present

## 2019-05-12 DIAGNOSIS — I700 Atherosclerosis of aorta: Secondary | ICD-10-CM | POA: Insufficient documentation

## 2019-05-12 DIAGNOSIS — C7A090 Malignant carcinoid tumor of the bronchus and lung: Secondary | ICD-10-CM | POA: Diagnosis not present

## 2019-05-12 DIAGNOSIS — R110 Nausea: Secondary | ICD-10-CM | POA: Insufficient documentation

## 2019-05-12 DIAGNOSIS — Z79899 Other long term (current) drug therapy: Secondary | ICD-10-CM | POA: Insufficient documentation

## 2019-05-12 LAB — CBC WITH DIFFERENTIAL (CANCER CENTER ONLY)
Abs Immature Granulocytes: 0.03 10*3/uL (ref 0.00–0.07)
Basophils Absolute: 0.1 10*3/uL (ref 0.0–0.1)
Basophils Relative: 1 %
Eosinophils Absolute: 0.2 10*3/uL (ref 0.0–0.5)
Eosinophils Relative: 3 %
HCT: 39.2 % (ref 36.0–46.0)
Hemoglobin: 12.6 g/dL (ref 12.0–15.0)
Immature Granulocytes: 0 %
Lymphocytes Relative: 32 %
Lymphs Abs: 2.3 10*3/uL (ref 0.7–4.0)
MCH: 30.7 pg (ref 26.0–34.0)
MCHC: 32.1 g/dL (ref 30.0–36.0)
MCV: 95.6 fL (ref 80.0–100.0)
Monocytes Absolute: 0.5 10*3/uL (ref 0.1–1.0)
Monocytes Relative: 6 %
Neutro Abs: 4.1 10*3/uL (ref 1.7–7.7)
Neutrophils Relative %: 58 %
Platelet Count: 369 10*3/uL (ref 150–400)
RBC: 4.1 MIL/uL (ref 3.87–5.11)
RDW: 12.9 % (ref 11.5–15.5)
WBC Count: 7.2 10*3/uL (ref 4.0–10.5)
nRBC: 0 % (ref 0.0–0.2)

## 2019-05-12 LAB — CMP (CANCER CENTER ONLY)
ALT: 13 U/L (ref 0–44)
AST: 18 U/L (ref 15–41)
Albumin: 4 g/dL (ref 3.5–5.0)
Alkaline Phosphatase: 77 U/L (ref 38–126)
Anion gap: 11 (ref 5–15)
BUN: 11 mg/dL (ref 8–23)
CO2: 26 mmol/L (ref 22–32)
Calcium: 9.4 mg/dL (ref 8.9–10.3)
Chloride: 106 mmol/L (ref 98–111)
Creatinine: 0.83 mg/dL (ref 0.44–1.00)
GFR, Est AFR Am: >60 mL/min (ref >60)
GFR, Estimated: >60 mL/min (ref >60)
Glucose, Bld: 102 mg/dL — ABNORMAL HIGH (ref 70–99)
Potassium: 3.9 mmol/L (ref 3.5–5.1)
Sodium: 143 mmol/L (ref 135–145)
Total Bilirubin: 0.3 mg/dL (ref 0.3–1.2)
Total Protein: 7.3 g/dL (ref 6.5–8.1)

## 2019-05-12 MED ORDER — IOHEXOL 300 MG/ML  SOLN
75.0000 mL | Freq: Once | INTRAMUSCULAR | Status: AC | PRN
  Administered 2019-05-12: 75 mL via INTRAVENOUS

## 2019-05-12 MED ORDER — SODIUM CHLORIDE (PF) 0.9 % IJ SOLN
INTRAMUSCULAR | Status: AC
  Filled 2019-05-12: qty 50

## 2019-05-15 ENCOUNTER — Other Ambulatory Visit: Payer: Self-pay

## 2019-05-15 ENCOUNTER — Inpatient Hospital Stay: Payer: Medicare Other | Admitting: Internal Medicine

## 2019-05-15 ENCOUNTER — Encounter: Payer: Self-pay | Admitting: Internal Medicine

## 2019-05-15 VITALS — BP 125/71 | HR 91 | Temp 99.1°F | Resp 18 | Ht 64.5 in | Wt 139.1 lb

## 2019-05-15 DIAGNOSIS — R918 Other nonspecific abnormal finding of lung field: Secondary | ICD-10-CM

## 2019-05-15 DIAGNOSIS — Z79899 Other long term (current) drug therapy: Secondary | ICD-10-CM | POA: Diagnosis not present

## 2019-05-15 DIAGNOSIS — E340 Carcinoid syndrome: Secondary | ICD-10-CM | POA: Diagnosis not present

## 2019-05-15 DIAGNOSIS — R110 Nausea: Secondary | ICD-10-CM | POA: Diagnosis not present

## 2019-05-15 DIAGNOSIS — C7A090 Malignant carcinoid tumor of the bronchus and lung: Secondary | ICD-10-CM | POA: Diagnosis not present

## 2019-05-15 DIAGNOSIS — I700 Atherosclerosis of aorta: Secondary | ICD-10-CM | POA: Diagnosis not present

## 2019-05-15 DIAGNOSIS — C3490 Malignant neoplasm of unspecified part of unspecified bronchus or lung: Secondary | ICD-10-CM | POA: Diagnosis not present

## 2019-05-15 NOTE — Progress Notes (Signed)
[facility] [phone]   [phone]  OFFICE PROGRESS NOTE  London Pepper, MD [address] [place] [address]  DIAGNOSIS: stage IA (T1b, N0, M0) typical carcinoid tumor The patient has multiple other pulmonary nodules that need close monitoring and observation.  PRIOR THERAPY: status post wedge resection of right upper lobectomy with lymph node sampling under the care of Dr. Roxan Hockey on 10/03/2018.  CURRENT THERAPY: Observation.  INTERVAL HISTORY: Shelly [name] 74 y.o. female returns to the clinic today for follow-up visit accompanied by her significant other palm.  The patient is feeling fine today except for persistent nausea.  She was seen by gastroenterology and had CT scan of the abdomen pelvis and upper endoscopy that were unremarkable.  The patient denied having any current chest pain, shortness of breath, cough or hemoptysis.  She denied having any fever or chills.  She has no nausea, vomiting, diarrhea or constipation.  She denied having any headache or visual changes.  She had repeat CT scan of the chest performed recently and she is here for evaluation and discussion of her risk her results.  MEDICAL HISTORY: Past Medical History:  Diagnosis Date  . Allergic rhinitis   . Arthritis    right hand  . Hypotension   . Transient gluten sensitivity    diarrhea and stomaCH pains    ALLERGIES:  is allergic to alpha-gal; gelatin; gluten; hyoscyamine sulfate; clindamycin; cortisone; penicillins; prednisone; and sulfonamide derivatives.  MEDICATIONS:  Current Outpatient Medications  Medication Sig Dispense Refill  . acetaminophen (TYLENOL) 500 MG tablet Take 1,000 mg by mouth every 6 (six) hours as needed (headaches.).     Marland Kitchen cetirizine (ZYRTEC) 10 MG tablet Take 5 mg by mouth daily as needed for allergies.    Marland Kitchen ibuprofen (ADVIL,MOTRIN) 200 MG tablet Take 200 mg by mouth every 6 (six) hours as needed for mild pain  (muscle pain.).     Marland Kitchen ipratropium (ATROVENT) 0.06 % nasal spray Place 2 sprays into the nose 4 (four) times daily as needed (congestion.).     Marland Kitchen meclizine (ANTIVERT) 25 MG tablet Take 12.5-25 mg by mouth 2 (two) times daily as needed.    . pseudoephedrine (SUDAFED) 30 MG tablet Take 15 mg by mouth every 4 (four) hours as needed for congestion.      No current facility-administered medications for this visit.    SURGICAL HISTORY:  Past Surgical History:  Procedure Laterality Date  . EUS N/A 06/28/2013   Procedure: ESOPHAGEAL ENDOSCOPIC ULTRASOUND (EUS) RADIAL;  Surgeon: Arta Silence, MD;  Location: [facility];  Service: Endoscopy;  Laterality: N/A;  . GALLBLADDER SURGERY  2005  . GANGLION CYST EXCISION Left 2003  . TUBAL LIGATION  1981  . VIDEO ASSISTED THORACOSCOPY (VATS)/WEDGE RESECTION Right 10/03/2018   Procedure: VIDEO ASSISTED THORACOSCOPY (VATS)/WEDGE RESECTION with multiple node dissection.;  Surgeon: Melrose Nakayama, MD;  Location: [facility] OR;  Service: Thoracic;  Laterality: Right;    REVIEW OF SYSTEMS:  A comprehensive review of systems was negative except for: Gastrointestinal: positive for nausea   PHYSICAL EXAMINATION: General appearance: alert, cooperative and no distress Head: Normocephalic, without obvious abnormality, atraumatic Neck: no adenopathy, no JVD, supple, symmetrical, trachea midline and thyroid not enlarged, symmetric, no tenderness/mass/nodules Lymph nodes: Cervical, supraclavicular, and axillary nodes normal. Resp: clear to auscultation bilaterally Back: symmetric, no curvature. ROM normal. No CVA tenderness. Cardio: regular rate and rhythm, S1, S2 normal, no murmur, click, rub or gallop GI:  soft, non-tender; bowel sounds normal; no masses,  no organomegaly Extremities: extremities normal, atraumatic, no cyanosis or edema  ECOG PERFORMANCE STATUS: 1 - Symptomatic but completely ambulatory  Blood pressure 125/71, pulse 91, temperature 99.1 F (37.3  C), temperature source Temporal, resp. rate 18, height 5' 4.5" (1.638 m), weight 139 lb 1.6 oz (63.1 kg), SpO2 98 %.  LABORATORY DATA: Lab Results  Component Value Date   WBC 7.2 05/12/2019   HGB 12.6 05/12/2019   HCT 39.2 05/12/2019   MCV 95.6 05/12/2019   PLT 369 05/12/2019      Chemistry      Component Value Date/Time   NA 143 05/12/2019 1227   K 3.9 05/12/2019 1227   CL 106 05/12/2019 1227   CO2 26 05/12/2019 1227   BUN 11 05/12/2019 1227   CREATININE 0.83 05/12/2019 1227      Component Value Date/Time   CALCIUM 9.4 05/12/2019 1227   ALKPHOS 77 05/12/2019 1227   AST 18 05/12/2019 1227   ALT 13 05/12/2019 1227   BILITOT 0.3 05/12/2019 1227       RADIOGRAPHIC STUDIES: CT Chest W Contrast  Result Date: 05/12/2019 CLINICAL DATA:  Neuroendocrine tumors of the GI tract, lung, thymus. Carcinoid syndrome. Right-sided wedge resection. COVID vaccine, second shot 03/17/2019. EXAM: CT CHEST WITH CONTRAST TECHNIQUE: Multidetector CT imaging of the chest was performed during intravenous contrast administration. CONTRAST:  84mL OMNIPAQUE IOHEXOL 300 MG/ML  SOLN COMPARISON:  03/04/2018 FINDINGS: Cardiovascular: Aortic atherosclerosis. Normal heart size, without pericardial effusion. No central pulmonary embolism, on this non-dedicated study. Mediastinum/Nodes: No supraclavicular adenopathy. High right paratracheal nodes of up to 7 mm are unchanged. No hilar adenopathy. Lungs/Pleura: No pleural fluid. Interval resection of the posterior right upper lobe pulmonary nodule. Soft tissue thickening at the resection site, without residual or recurrent mass. Moderate centrilobular emphysema. Right lower lobe calcified granuloma. Anteromedial right lower lobe 6 mm nodule on 103/7, similar. Right middle lobe pulmonary nodule of 5 mm on 75/7, similar. Left upper lobe peribronchovascular nodularity is felt to be similar, including on 49/7. Just caudal to this, nodule versus area of mucoid impaction  measures 5 mm on 50/7 and is unchanged. Mucoid impaction in the lingula is similar on 95/7. 3 mm right lower lobe pulmonary nodule is unchanged on 83/7. Upper Abdomen: Too small to characterize high left hepatic lobe lesion is unchanged. Normal imaged portions of the spleen, stomach, pancreas, adrenal glands, left kidney. Incompletely imaged interpolar right renal 1.9 cm low-density lesion is likely a cyst. Musculoskeletal: No acute osseous abnormality. Mid and upper thoracic spondylosis. IMPRESSION: 1. Interval resection of posterior right upper lobe pulmonary nodule. Soft tissue thickening at the resection site, without residual or recurrent mass. 2. Similar bilateral pulmonary nodules, favoring a benign etiology. Areas of left upper lobe and lingular presumed mucoid impaction. 3. Aortic Atherosclerosis (ICD10-I70.0) and Emphysema (ICD10-J43.9). Electronically Signed   By: Abigail Miyamoto M.D.   On: 05/12/2019 15:56    ASSESSMENT AND PLAN: This is a very pleasant 74 years old white female with a stage Ia carcinoid tumor of the lung status post wedge resection of the right upper lobe with lymph node sampling in September 2020 under the care of Dr. Roxan Hockey. The patient is feeling fine today with no concerning complaints except for intermittent nausea. She had repeat CT scan of the chest performed recently.  I personally and independently reviewed the scans and discussed the results with the patient today. Her scan showed no concerning findings for disease progression  or metastasis but she continues to have small bilateral stable pulmonary nodules. I recommended for the patient to continue on observation with repeat CT scan of the chest in 1 year. For the nausea, she will continue her routine follow-up visit and evaluation by her gastroenterologist. The patient was advised to call immediately if she has any concerning symptoms in the interval. The patient voices understanding of current disease status and  treatment options and is in agreement with the current care plan.  All questions were answered. The patient knows to call the clinic with any problems, questions or concerns. We can certainly see the patient much sooner if necessary.  Disclaimer: This note was dictated with voice recognition software. Similar sounding words can inadvertently be transcribed and may not be corrected upon review.

## 2019-05-17 ENCOUNTER — Telehealth: Payer: Self-pay | Admitting: Internal Medicine

## 2019-05-17 NOTE — Telephone Encounter (Signed)
Scheduled per los. Called and left msg. Mailed printout  °

## 2019-08-11 ENCOUNTER — Telehealth: Payer: Self-pay | Admitting: Physical Medicine and Rehabilitation

## 2019-08-11 NOTE — Telephone Encounter (Signed)
Left message #1.  Patient last saw Dr. Ernestina Patches in 2019- needs 30 minute OV.

## 2019-08-11 NOTE — Telephone Encounter (Signed)
Patient called needing to schedule an appointment with Dr Ernestina Patches for her lower back. The number to contact patient is [phone]

## 2019-08-11 NOTE — Telephone Encounter (Signed)
Pt called stating she missed a call and would like for you to try again.  [phone]

## 2019-08-14 NOTE — Telephone Encounter (Signed)
Scheduled for OV. 

## 2019-08-14 NOTE — Telephone Encounter (Signed)
See previous message

## 2019-08-22 DIAGNOSIS — H5213 Myopia, bilateral: Secondary | ICD-10-CM | POA: Diagnosis not present

## 2019-08-29 ENCOUNTER — Other Ambulatory Visit: Payer: Self-pay

## 2019-08-29 ENCOUNTER — Ambulatory Visit: Payer: Medicare Other | Admitting: Physical Medicine and Rehabilitation

## 2019-08-29 ENCOUNTER — Encounter: Payer: Self-pay | Admitting: Physical Medicine and Rehabilitation

## 2019-08-29 VITALS — BP 103/65 | HR 80

## 2019-08-29 DIAGNOSIS — M48062 Spinal stenosis, lumbar region with neurogenic claudication: Secondary | ICD-10-CM | POA: Diagnosis not present

## 2019-08-29 DIAGNOSIS — M5416 Radiculopathy, lumbar region: Secondary | ICD-10-CM

## 2019-08-29 DIAGNOSIS — F411 Generalized anxiety disorder: Secondary | ICD-10-CM

## 2019-08-29 DIAGNOSIS — M48061 Spinal stenosis, lumbar region without neurogenic claudication: Secondary | ICD-10-CM

## 2019-08-29 MED ORDER — DIAZEPAM 5 MG PO TABS: ORAL_TABLET | ORAL | 0 refills | Status: DC

## 2019-08-29 NOTE — Progress Notes (Signed)
  Numeric Pain Rating Scale and Functional Assessment Average Pain 5 Pain Right Now 5 My pain is intermittent and sharp Pain is worse with: walking, sitting and some activites Pain improves with: rest   In the last MONTH (on 0-10 scale) has pain interfered with the following?  1. General activity like being  able to carry out your everyday physical activities such as walking, climbing stairs, carrying groceries, or moving a chair?  Rating(7)  2. Relation with others like being able to carry out your usual social activities and roles such as  activities at home, at work and in your community. Rating(7)  3. Enjoyment of life such that you have  been bothered by emotional problems such as feeling anxious, depressed or irritable?  Rating(5)

## 2019-08-29 NOTE — Progress Notes (Signed)
Shelly [name] - 74 y.o. female MRN [id number]  Date of birth: Sep 15, 1945  Office Visit Note: Visit Date: 08/29/2019 PCP: London Pepper, MD Referred by: London Pepper, MD  Subjective: Chief Complaint  Patient presents with  . Lower Back - Pain   HPI: Shelly [name] is a 74 y.o. female who comes in today For evaluation management of chronic worsening severe low back and left hip and lateral thigh and leg pain.  By way of review the last time we saw the patient was in May 2019 we did complete lumbar epidural injection at L5-S1 for what is known with her to be at L4-5 paracentral disc protrusion along with moderate lateral recess narrowing and some arthritis.  This MRI is reviewed with her again but does date back to 2016.  She did extremely well with the injection really and has not had much in the way of pain up until the last 6 to 8 months.  She typically sees Dr. Durward Fortes for orthopedic care but has not seen him recently.  She reports no specific trauma since last time we have seen her and the symptoms are still left-sided no right-sided complaints at all.  Worse with standing and ambulating but also with sitting.  She has had no focal weakness.  No bowel or bladder changes no red flag complaints.  Since I have seen her last though she has been diagnosed with a carcinoid tumor in the upper lobe of the lung.  She underwent a VATS procedure for resection.  She also comments that she felt like after she quit smoking because of all these procedure with her lungs that she has gained 15 pounds of weight.  Her biggest concern was the weight gain and she felt like this contributed to her back pain.  She has been trying to regroup with physical therapy exercises that she says she uses anytime her back hurts.  She is also been doing some exercises for weight loss but these seem to be geared towards more spot reduction and she has been doing a lot of abdominal work because she feels like a lot of the weight  is around her abdomen.  Her husband is present with her today and is provides some of the history.  He also requested a an appointment with me for review of his back and neck.  He has been seeing Dr. Dema Severin in town.  We will try to make an appointment to see him in the future.  Review of Systems  Musculoskeletal: Positive for back pain and joint pain.       Left radicular leg pain  All other systems reviewed and are negative.  Otherwise per HPI.  Assessment & Plan: Visit Diagnoses:  1. Lumbar radiculopathy   2. Spinal stenosis of lumbar region with neurogenic claudication   3. Stenosis of lateral recess of lumbar spine   4. Pre-operative anxiety     Plan: Findings:  Chronic long-term history of back pain and left radicular pain with history of MRI findings of lumbar stenosis at L4-5 with disc protrusion as well and good relief with intermittent epidural injection in the past.  Complications really include recent diagnosis of carcinoid tumor status post resection with VATS procedure.  She has gained some weight after quitting smoking which is still beneficial that she quit smoking.  She does have some underlying anxiety depression issues I have also felt like she probably has some underlying central sensitization type pain syndrome.  I do recommend  epidural injection diagnostically and hopefully therapeutically.  This would be a repeat of the left L5-S1 interlaminar pleural steroid injection.She does have some preprocedure anxiety issues and we do usually premedicate her with some Valium prior to injection.  This was well documented in prior notes.  She does endorse getting more of a rash or what is typically flushing with cortisone and prednisone.  This is usually self-limiting.  If she does not get much relief from the injection then we would look at repeat MRI of the lumbar spine.  Last MRI was 2016.  We also had a long discussion today for approximately 15 minutes on the real inability to spot  reduce in terms of exercises.  We also talked about lumbar spine flexion and extension with abdominal work and that this likely could be a resulting problem as far as flaring up her symptoms.  We discussed Dr. Tressie Ellis McGill exercises for core stabilization without lumbar flexion extension.  She will look those up.  We also talked about a baseline amount of activity with core strengthening not just when her back pain flares up.    Meds & Orders:  Meds ordered this encounter  Medications  . diazepam (VALIUM) 5 MG tablet    Sig: Take 1 by mouth 1 hour  pre-procedure with very light food. May bring 2nd tablet to appointment.    Dispense:  2 tablet    Refill:  0   No orders of the defined types were placed in this encounter.   Follow-up: Return for Left L5-S1 interlaminar steroid injection fluoroscopic guidance.   Procedures: No procedures performed  No notes on file   Clinical History: Lumbar MRI 08/18/2014 1 L4-5: Mild disc bulging and moderate facet hypertrophy result in moderate spinal stenosis, mild right and moderate left lateral recess stenosis, and no significant neural foraminal stenosis. There is the potential for left-sided nerve root impingement in the left lateral recess, particularly of the L5 nerve root.  L5-S1: Mild disc bulging and moderate facet arthrosis without stenosis. Small right facet joint effusion.  IMPRESSION: 1. Mild disc and moderate facet degeneration at L4-5 resulting in moderate spinal stenosis and left greater than right lateral recess stenosis. 2. Mild disc and facet degeneration elsewhere without stenosis.   She reports that she quit smoking about 11 months ago. Her smoking use included cigarettes. She has a 14.25 pack-year smoking history. She has never used smokeless tobacco. No results for input(s): HGBA1C, LABURIC in the last 8760 hours.  Objective:  VS:  HT:    WT:   BMI:     BP:103/65  HR:80bpm  TEMP: ( )  RESP:  Physical Exam Vitals  and nursing note reviewed.  Constitutional:      General: She is not in acute distress.    Appearance: Normal appearance. She is well-developed. She is not ill-appearing.  HENT:     Head: Normocephalic and atraumatic.  Eyes:     Conjunctiva/sclera: Conjunctivae normal.     Pupils: Pupils are equal, round, and reactive to light.  Cardiovascular:     Rate and Rhythm: Normal rate.     Pulses: Normal pulses.  Pulmonary:     Effort: Pulmonary effort is normal.  Musculoskeletal:     Right lower leg: No edema.     Left lower leg: No edema.     Comments: Patient is somewhat slow to go into full extension from sit to stand.  She has no pain with hip rotation.  She has no pain  over the greater trochanter.  She has tender points across the lower back in general.  She has good distal strength.  Skin:    General: Skin is warm and dry.     Findings: No erythema or rash.  Neurological:     General: No focal deficit present.     Mental Status: She is alert and oriented to person, place, and time.     Sensory: No sensory deficit.     Motor: No abnormal muscle tone.     Coordination: Coordination normal.     Gait: Gait normal.  Psychiatric:        Mood and Affect: Mood normal.        Behavior: Behavior normal.     Ortho Exam  Imaging: No results found.  Past Medical/Family/Surgical/Social History: Medications & Allergies reviewed per EMR, new medications updated. Patient Active Problem List   Diagnosis Date Noted  . Malignant carcinoid tumor of the bronchus and lung ([facility]) 05/15/2019  . Nodule of upper lobe of right lung 10/03/2018  . Cigarette smoker 09/05/2018  . Pulmonary nodules 08/31/2011  . Hemoptysis, unspecified 08/31/2011  . Anxiety state 02/13/2010  . DEPRESSION 02/13/2010  . DIARRHEA 02/13/2010  . ABDOMINAL PAIN-PERIUMBILICAL 16/10/9602   Past Medical History:  Diagnosis Date  . Allergic rhinitis   . Arthritis    right hand  . Hypotension   . Transient gluten  sensitivity    diarrhea and stomaCH pains   Family History  Problem Relation Age of Onset  . Prostate cancer Father   . Emphysema Father    Past Surgical History:  Procedure Laterality Date  . EUS N/A 06/28/2013   Procedure: ESOPHAGEAL ENDOSCOPIC ULTRASOUND (EUS) RADIAL;  Surgeon: Arta Silence, MD;  Location: [facility];  Service: Endoscopy;  Laterality: N/A;  . GALLBLADDER SURGERY  2005  . GANGLION CYST EXCISION Left 2003  . TUBAL LIGATION  1981  . VIDEO ASSISTED THORACOSCOPY (VATS)/WEDGE RESECTION Right 10/03/2018   Procedure: VIDEO ASSISTED THORACOSCOPY (VATS)/WEDGE RESECTION with multiple node dissection.;  Surgeon: Melrose Nakayama, MD;  Location: [facility] OR;  Service: Thoracic;  Laterality: Right;   Social History   Occupational History  . Occupation: retired   Tobacco Use  . Smoking status: Former Smoker    Packs/day: 0.25    Years: 57.00    Pack years: 14.25    Types: Cigarettes    Quit date: 09/29/2018    Years since quitting: 0.9  . Smokeless tobacco: Never Used  Vaping Use  . Vaping Use: Never used  Substance and Sexual Activity  . Alcohol use: No  . Drug use: No  . Sexual activity: Not on file

## 2019-08-30 ENCOUNTER — Encounter: Payer: Self-pay | Admitting: Physical Medicine and Rehabilitation

## 2019-08-31 ENCOUNTER — Encounter: Payer: Self-pay | Admitting: Physical Medicine and Rehabilitation

## 2019-08-31 ENCOUNTER — Other Ambulatory Visit: Payer: Self-pay

## 2019-08-31 ENCOUNTER — Ambulatory Visit: Payer: Medicare Other | Admitting: Physical Medicine and Rehabilitation

## 2019-08-31 ENCOUNTER — Ambulatory Visit: Payer: Self-pay

## 2019-08-31 VITALS — BP 109/70 | HR 89

## 2019-08-31 DIAGNOSIS — M5416 Radiculopathy, lumbar region: Secondary | ICD-10-CM | POA: Diagnosis not present

## 2019-08-31 MED ORDER — METHYLPREDNISOLONE ACETATE 80 MG/ML IJ SUSP
80.0000 mg | Freq: Once | INTRAMUSCULAR | Status: AC
  Administered 2019-08-31: 80 mg

## 2019-08-31 NOTE — Progress Notes (Signed)
Pt state left lower back, hip and leg pain. Pt state walking, sitting and excising makes the pain worse. Pt state other excises and sit in was that doesn't hurt and pain pills.  Numeric Pain Rating Scale and Functional Assessment Average Pain 5   In the last MONTH (on 0-10 scale) has pain interfered with the following?  1. General activity like being  able to carry out your everyday physical activities such as walking, climbing stairs, carrying groceries, or moving a chair?  Rating(9)   +Driver, -BT, -Dye Allergies.

## 2019-09-01 NOTE — Procedures (Signed)
Lumbar Epidural Steroid Injection - Interlaminar Approach with Fluoroscopic Guidance  Patient: Shelly [name]      Date of Birth: Feb 11, 1945 MRN: [id number] PCP: London Pepper, MD      Visit Date: 08/31/2019   Universal Protocol:     Consent Given By: the patient  Position: PRONE  Additional Comments: Vital signs were monitored before and after the procedure. Patient was prepped and draped in the usual sterile fashion. The correct patient, procedure, and site was verified.   Injection Procedure Details:  Procedure Site One Meds Administered:  Meds ordered this encounter  Medications  . methylPREDNISolone acetate (DEPO-MEDROL) injection 80 mg     Laterality: Left  Location/Site:  L5-S1  Needle size: 20 G  Needle type: Tuohy  Needle Placement: Paramedian epidural  Findings:   -Comments: Excellent flow of contrast into the epidural space.  Procedure Details: Using a paramedian approach from the side mentioned above, the region overlying the inferior lamina was localized under fluoroscopic visualization and the soft tissues overlying this structure were infiltrated with 4 ml. of 1% Lidocaine without Epinephrine. The Tuohy needle was inserted into the epidural space using a paramedian approach.   The epidural space was localized using loss of resistance along with lateral and bi-planar fluoroscopic views.  After negative aspirate for air, blood, and CSF, a 2 ml. volume of Isovue-250 was injected into the epidural space and the flow of contrast was observed. Radiographs were obtained for documentation purposes.    The injectate was administered into the level noted above.   Additional Comments:  The patient tolerated the procedure well Dressing: 2 x 2 sterile gauze and Band-Aid    Post-procedure details: Patient was observed during the procedure. Post-procedure instructions were reviewed.  Patient left the clinic in stable condition.

## 2019-09-01 NOTE — Progress Notes (Signed)
Shelly [name] - 74 y.o. female MRN [id number]  Date of birth: 29-Aug-1945  Office Visit Note: Visit Date: 08/31/2019 PCP: London Pepper, MD Referred by: London Pepper, MD  Subjective: Chief Complaint  Patient presents with  . Left Hip - Pain  . Left Leg - Pain  . Lower Back - Pain   HPI:  Shelly [name] is a 74 y.o. female who comes in today at the request of Dr. Laurence Spates for planned Left L5-S1 Lumbar epidural steroid injection with fluoroscopic guidance.  The patient has failed conservative care including home exercise, medications, time and activity modification.  This injection will be diagnostic and hopefully therapeutic.  Please see requesting physician notes for further details and justification.   ROS Otherwise per HPI.  Assessment & Plan: Visit Diagnoses:  1. Lumbar radiculopathy     Plan: No additional findings.   Meds & Orders:  Meds ordered this encounter  Medications  . methylPREDNISolone acetate (DEPO-MEDROL) injection 80 mg    Orders Placed This Encounter  Procedures  . XR C-ARM NO REPORT  . Epidural Steroid injection    Follow-up: Return if symptoms worsen or fail to improve.   Procedures: No procedures performed  Lumbar Epidural Steroid Injection - Interlaminar Approach with Fluoroscopic Guidance  Patient: Shelly [name]      Date of Birth: [birth year] MRN: [id number] PCP: London Pepper, MD      Visit Date: 08/31/2019   Universal Protocol:     Consent Given By: the patient  Position: PRONE  Additional Comments: Vital signs were monitored before and after the procedure. Patient was prepped and draped in the usual sterile fashion. The correct patient, procedure, and site was verified.   Injection Procedure Details:  Procedure Site One Meds Administered:  Meds ordered this encounter  Medications  . methylPREDNISolone acetate (DEPO-MEDROL) injection 80 mg     Laterality: Left  Location/Site:  L5-S1  Needle size: 20  G  Needle type: Tuohy  Needle Placement: Paramedian epidural  Findings:   -Comments: Excellent flow of contrast into the epidural space.  Procedure Details: Using a paramedian approach from the side mentioned above, the region overlying the inferior lamina was localized under fluoroscopic visualization and the soft tissues overlying this structure were infiltrated with 4 ml. of 1% Lidocaine without Epinephrine. The Tuohy needle was inserted into the epidural space using a paramedian approach.   The epidural space was localized using loss of resistance along with lateral and bi-planar fluoroscopic views.  After negative aspirate for air, blood, and CSF, a 2 ml. volume of Isovue-250 was injected into the epidural space and the flow of contrast was observed. Radiographs were obtained for documentation purposes.    The injectate was administered into the level noted above.   Additional Comments:  The patient tolerated the procedure well Dressing: 2 x 2 sterile gauze and Band-Aid    Post-procedure details: Patient was observed during the procedure. Post-procedure instructions were reviewed.  Patient left the clinic in stable condition.     Clinical History: Lumbar MRI 08/18/2014 1 L4-5: Mild disc bulging and moderate facet hypertrophy result in moderate spinal stenosis, mild right and moderate left lateral recess stenosis, and no significant neural foraminal stenosis. There is the potential for left-sided nerve root impingement in the left lateral recess, particularly of the L5 nerve root.  L5-S1: Mild disc bulging and moderate facet arthrosis without stenosis. Small right facet joint effusion.  IMPRESSION: 1. Mild disc and moderate facet degeneration at L4-5  resulting in moderate spinal stenosis and left greater than right lateral recess stenosis. 2. Mild disc and facet degeneration elsewhere without stenosis.     Objective:  VS:  HT:    WT:   BMI:     BP:109/70   HR:89bpm  TEMP: ( )  RESP:  Physical Exam Constitutional:      General: She is not in acute distress.    Appearance: Normal appearance. She is not ill-appearing.  HENT:     Head: Normocephalic and atraumatic.     Right Ear: External ear normal.     Left Ear: External ear normal.  Eyes:     Extraocular Movements: Extraocular movements intact.  Cardiovascular:     Rate and Rhythm: Normal rate.     Pulses: Normal pulses.  Musculoskeletal:     Right lower leg: No edema.     Left lower leg: No edema.     Comments: Patient has good distal strength with no pain over the greater trochanters.  No clonus or focal weakness.  Skin:    Findings: No erythema, lesion or rash.  Neurological:     General: No focal deficit present.     Mental Status: She is alert and oriented to person, place, and time.     Sensory: No sensory deficit.     Motor: No weakness or abnormal muscle tone.     Coordination: Coordination normal.  Psychiatric:        Mood and Affect: Mood normal.        Behavior: Behavior normal.      Imaging: XR C-ARM NO REPORT  Result Date: 08/31/2019 Please see Notes tab for imaging impression.

## 2019-09-28 DIAGNOSIS — Z23 Encounter for immunization: Secondary | ICD-10-CM | POA: Diagnosis not present

## 2019-10-02 ENCOUNTER — Other Ambulatory Visit: Payer: Self-pay | Admitting: Thoracic Surgery (Cardiothoracic Vascular Surgery)

## 2019-10-02 DIAGNOSIS — R911 Solitary pulmonary nodule: Secondary | ICD-10-CM

## 2019-10-03 ENCOUNTER — Ambulatory Visit: Payer: Medicare Other | Admitting: Thoracic Surgery (Cardiothoracic Vascular Surgery)

## 2019-10-03 ENCOUNTER — Ambulatory Visit
Admission: RE | Admit: 2019-10-03 | Discharge: 2019-10-03 | Disposition: A | Discharge status: Home / Self Care | Payer: Medicare Other | Source: Ambulatory Visit | Attending: Thoracic Surgery (Cardiothoracic Vascular Surgery) | Admitting: Thoracic Surgery (Cardiothoracic Vascular Surgery)

## 2019-10-03 ENCOUNTER — Other Ambulatory Visit: Payer: Self-pay

## 2019-10-03 ENCOUNTER — Encounter: Payer: Self-pay | Admitting: Thoracic Surgery (Cardiothoracic Vascular Surgery)

## 2019-10-03 VITALS — BP 113/66 | HR 96 | Temp 97.7°F | Resp 20 | Ht 64.5 in | Wt 141.0 lb

## 2019-10-03 DIAGNOSIS — R911 Solitary pulmonary nodule: Secondary | ICD-10-CM

## 2019-10-03 DIAGNOSIS — C7A090 Malignant carcinoid tumor of the bronchus and lung: Secondary | ICD-10-CM | POA: Diagnosis not present

## 2019-10-03 NOTE — Progress Notes (Signed)
[place]       [name] [id number]             [phone]     HPI: 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  She was first found to have lung nodules in 2010.  Those were followed for a while but did not change.  In 2020 she developed a cough.  A CT of the chest showed a right upper lobe nodule that was increased in size.  On PET the nodule was hypermetabolic.  I did a right VATS for wedge resection and node sampling.  Lesion turned out to be a stage Ia low-grade carcinoid tumor.  I last saw her in the office in December.  She was having some paresthesias but overall was doing well.  She saw Dr. Julien Nordmann in May.  Her CT showed no evidence of disease and she will see him again at 12 months.  She occasionally will have pain in the right side around the incisions.  It is usually related to moving where she bends in that direction.  She not having any respiratory issues.  She did quit smoking.  She is gained about 15 pounds since she quit. Current Outpatient Medications  Medication Sig Dispense Refill  . acetaminophen (TYLENOL) 500 MG tablet Take 1,000 mg by mouth every 6 (six) hours as needed (headaches.).     Marland Kitchen cetirizine (ZYRTEC) 10 MG tablet Take 5 mg by mouth daily as needed for allergies.    . diazepam (VALIUM) 5 MG tablet Take 1 by mouth 1 hour  pre-procedure with very light food. May bring 2nd tablet to appointment. 2 tablet 0  . ibuprofen (ADVIL,MOTRIN) 200 MG tablet Take 200 mg by mouth every 6 (six) hours as needed for mild pain (muscle pain.).     Marland Kitchen ipratropium (ATROVENT) 0.06 % nasal spray Place 2 sprays into the nose 4 (four) times daily as needed (congestion.).     Marland Kitchen meclizine (ANTIVERT) 25 MG tablet Take 12.5-25 mg by mouth 2 (two) times daily as needed.    . pseudoephedrine (SUDAFED) 30 MG tablet  Take 15 mg by mouth every 4 (four) hours as needed for congestion.      No current facility-administered medications for this visit.    Physical Exam BP 113/66   Pulse 96   Temp 97.7 F (36.5 C) (Skin)   Resp 20   Ht 5' 4.5" (1.638 m)   Wt 141 lb (64 kg)   SpO2 99% Comment: RA  BMI 23.47 kg/m  74 year old woman in no acute distress Alert and oriented x3 with no focal deficits Lungs clear with equal breath sounds bilaterally Incisions well-healed No cervical or supraclavicular adenopathy Cardiac regular rate and rhythm  Diagnostic Tests: CHEST - 2 VIEW  COMPARISON:  December 27, 2018.  May 12, 2019.  FINDINGS: The heart size and mediastinal contours are within normal limits. Both lungs are clear. The visualized skeletal structures are unremarkable.  IMPRESSION: No active cardiopulmonary disease.   Electronically Signed   By: Marijo Conception M.D.   On: 10/03/2019 13:48  I personally reviewed the chest x-ray images and concur with the findings noted above  Impression: Shelly [name] is a 74 year old woman with history of tobacco abuse, arthritis, gluten allergy, alpha gal, and carcinoid tumor of the  right upper lobe.   Carcinoid tumor-low-grade, stage Ia.  Has an appointment with Dr. Julien Nordmann for next May with a CT for follow-up.  Tobacco abuse-quit smoking after surgery.  I congratulated her on that.  Plan: Follow-up as scheduled Dr. Julien Nordmann I will be happy to see Mrs. Mangus back anytime in the future if I can be of any further assistance with her care  Melrose Nakayama, MD [facility] [phone]

## 2019-11-07 DIAGNOSIS — J324 Chronic pansinusitis: Secondary | ICD-10-CM | POA: Diagnosis not present

## 2019-11-07 DIAGNOSIS — J300 Vasomotor rhinitis: Secondary | ICD-10-CM | POA: Diagnosis not present

## 2019-11-07 DIAGNOSIS — H903 Sensorineural hearing loss, bilateral: Secondary | ICD-10-CM | POA: Diagnosis not present

## 2019-11-07 DIAGNOSIS — J342 Deviated nasal septum: Secondary | ICD-10-CM | POA: Diagnosis not present

## 2019-11-07 DIAGNOSIS — J0100 Acute maxillary sinusitis, unspecified: Secondary | ICD-10-CM | POA: Diagnosis not present

## 2019-11-07 DIAGNOSIS — J343 Hypertrophy of nasal turbinates: Secondary | ICD-10-CM | POA: Diagnosis not present

## 2019-11-07 DIAGNOSIS — J3489 Other specified disorders of nose and nasal sinuses: Secondary | ICD-10-CM | POA: Diagnosis not present

## 2019-11-07 DIAGNOSIS — J320 Chronic maxillary sinusitis: Secondary | ICD-10-CM | POA: Diagnosis not present

## 2019-11-10 DIAGNOSIS — Z20822 Contact with and (suspected) exposure to covid-19: Secondary | ICD-10-CM | POA: Diagnosis not present

## 2019-11-14 DIAGNOSIS — L0109 Other impetigo: Secondary | ICD-10-CM | POA: Diagnosis not present

## 2019-11-14 DIAGNOSIS — J3089 Other allergic rhinitis: Secondary | ICD-10-CM | POA: Diagnosis not present

## 2019-11-14 DIAGNOSIS — J300 Vasomotor rhinitis: Secondary | ICD-10-CM | POA: Diagnosis not present

## 2019-11-14 DIAGNOSIS — Z91018 Allergy to other foods: Secondary | ICD-10-CM | POA: Diagnosis not present

## 2020-02-07 DIAGNOSIS — J0100 Acute maxillary sinusitis, unspecified: Secondary | ICD-10-CM | POA: Diagnosis not present

## 2020-03-19 DIAGNOSIS — J310 Chronic rhinitis: Secondary | ICD-10-CM | POA: Diagnosis not present

## 2020-03-19 DIAGNOSIS — R42 Dizziness and giddiness: Secondary | ICD-10-CM | POA: Diagnosis not present

## 2020-04-11 ENCOUNTER — Telehealth: Payer: Self-pay | Admitting: Physical Medicine and Rehabilitation

## 2020-04-11 NOTE — Telephone Encounter (Signed)
Scheduled office visit with Dr. Durward Fortes.

## 2020-04-11 NOTE — Telephone Encounter (Signed)
Patient wants to see Dr. Durward Fortes for her hands and need to make sure it's ok since Dr. Ernestina Patches is her doctor. Please call patient or let me know it's ok. Patient phone number is [phone].

## 2020-04-23 ENCOUNTER — Ambulatory Visit: Payer: Medicare Other | Admitting: Orthopaedic Surgery

## 2020-04-23 ENCOUNTER — Other Ambulatory Visit: Payer: Self-pay

## 2020-04-23 ENCOUNTER — Encounter: Payer: Self-pay | Admitting: Orthopaedic Surgery

## 2020-04-23 DIAGNOSIS — M20012 Mallet finger of left finger(s): Secondary | ICD-10-CM | POA: Insufficient documentation

## 2020-04-23 DIAGNOSIS — M65341 Trigger finger, right ring finger: Secondary | ICD-10-CM | POA: Diagnosis not present

## 2020-04-23 NOTE — Progress Notes (Signed)
Office Visit Note   Patient: Shelly [name]           Date of Birth: April 23, 1945           MRN: [id number] Visit Date: 04/23/2020              Requested by: London Pepper, MD [address],  [place] [address] PCP: London Pepper, MD   Assessment & Plan: Visit Diagnoses:  1. Mallet finger of left hand   2. Trigger finger, right ring finger     Plan: Left long mallet finger injury 3 weeks ago.  Long discussion regarding treatment options.  We will continue splinting for the next 3 weeks with a stack splint.  Have discussed possibilities in the future with incomplete extension.  I believe this is all soft tissue.  There was no pain and there was no mass or prominence dorsally.  Have also discussed possibility of fusion in the future but if she has incomplete extension without a problem I think she is much better off just allowing the finger to flex if she is not having any pain or inconvenience.  In addition she has active triggering of the right ring finger.  She can splint the PIP joint apply Voltaren gel and give it time.  I think eventually she will will require release of the A1 pulley I have discussed that with her as well  Follow-Up Instructions: Return if symptoms worsen or fail to improve.   Orders:  No orders of the defined types were placed in this encounter.  No orders of the defined types were placed in this encounter.     Procedures: No procedures performed   Clinical Data: No additional findings.   Subjective: Chief Complaint  Patient presents with  . Right Hand - Pain  . Left Hand - Pain  Patient presents today for bilateral hands. She said that her right ring finger and left long finger are getting stuck in the flexed position. She has been splinting her left long finger. It does not hurt, but swells. Her right ring finger is painful.  She actually injured her left long finger playing "ball" about 3 weeks ago.  She has been applying  a splint to keep her finger straight but notes that when the finger is not splinted that it falls into flexion.  No numbness tingling or swelling at present.  HPI  Review of Systems   Objective: Vital Signs: Ht 5\' 4"  (1.626 m)   Wt 141 lb (64 kg)   BMI 24.20 kg/m   Physical Exam Constitutional:      Appearance: She is well-developed.  Eyes:     Pupils: Pupils are equal, round, and reactive to light.  Pulmonary:     Effort: Pulmonary effort is normal.  Skin:    General: Skin is warm and dry.  Neurological:     Mental Status: She is alert and oriented to person, place, and time.  Psychiatric:        Behavior: Behavior normal.     Ortho Exam left long finger with incomplete extension across the DIP joint.  The finger was not red or hot or swollen.  Neurologically intact.  Good capillary refill.  I could fully extend the DIP joint and she could maintain that position just minimally.  There is no tenderness over the extensor tendon or mass formation.  No instability.  I believe she has a soft tissue mallet without a fracture.  In  addition she has triggering of the right ring finger with some tenderness over the A1 pulley she can actively trigger the finger but with not having much pain.  Neurologically intact with full extension across the PIP joint Specialty Comments:  No specialty comments available.  Imaging: No results found.   PMFS History: Patient Active Problem List   Diagnosis Date Noted  . Mallet finger of left hand 04/23/2020  . Trigger finger, right ring finger 04/23/2020  . Malignant carcinoid tumor of the bronchus and lung ([facility]) 05/15/2019  . Nodule of upper lobe of right lung 10/03/2018  . Cigarette smoker 09/05/2018  . Pulmonary nodules 08/31/2011  . Hemoptysis, unspecified 08/31/2011  . Anxiety state 02/13/2010  . DEPRESSION 02/13/2010  . DIARRHEA 02/13/2010  . ABDOMINAL PAIN-PERIUMBILICAL 30/07/6224   Past Medical History:  Diagnosis Date  .  Allergic rhinitis   . Arthritis    right hand  . Hypotension   . Transient gluten sensitivity    diarrhea and stomaCH pains    Family History  Problem Relation Age of Onset  . Prostate cancer Father   . Emphysema Father     Past Surgical History:  Procedure Laterality Date  . EUS N/A 06/28/2013   Procedure: ESOPHAGEAL ENDOSCOPIC ULTRASOUND (EUS) RADIAL;  Surgeon: Arta Silence, MD;  Location: [facility];  Service: Endoscopy;  Laterality: N/A;  . GALLBLADDER SURGERY  2005  . GANGLION CYST EXCISION Left 2003  . TUBAL LIGATION  1981  . VIDEO ASSISTED THORACOSCOPY (VATS)/WEDGE RESECTION Right 10/03/2018   Procedure: VIDEO ASSISTED THORACOSCOPY (VATS)/WEDGE RESECTION with multiple node dissection.;  Surgeon: Melrose Nakayama, MD;  [facility];  Service: Thoracic;  Laterality: Right;   Social History   Occupational History  . Occupation: retired   Tobacco Use  . Smoking status: Former Smoker    Packs/day: 0.25    Years: 57.00    Pack years: 14.25    Types: Cigarettes    Quit date: 09/29/2018    Years since quitting: 1.5  . Smokeless tobacco: Never Used  Vaping Use  . Vaping Use: Never used  Substance and Sexual Activity  . Alcohol use: No  . Drug use: No  . Sexual activity: Not on file

## 2020-04-24 ENCOUNTER — Telehealth: Payer: Self-pay | Admitting: Internal Medicine

## 2020-04-24 DIAGNOSIS — Z0000 Encounter for general adult medical examination without abnormal findings: Secondary | ICD-10-CM | POA: Diagnosis not present

## 2020-04-24 DIAGNOSIS — R42 Dizziness and giddiness: Secondary | ICD-10-CM | POA: Diagnosis not present

## 2020-04-24 DIAGNOSIS — E782 Mixed hyperlipidemia: Secondary | ICD-10-CM | POA: Diagnosis not present

## 2020-04-24 DIAGNOSIS — M810 Age-related osteoporosis without current pathological fracture: Secondary | ICD-10-CM | POA: Diagnosis not present

## 2020-04-24 NOTE — Telephone Encounter (Signed)
R/s appt time per 4/20 sch msg. Pt aware.

## 2020-04-30 ENCOUNTER — Telehealth: Payer: Self-pay | Admitting: Internal Medicine

## 2020-04-30 NOTE — Telephone Encounter (Signed)
R/s 5/10 appt due to provider PAL. Called and spoke with patient. Confirmed new date and time

## 2020-05-13 ENCOUNTER — Other Ambulatory Visit: Payer: Medicare Other

## 2020-05-13 ENCOUNTER — Telehealth: Payer: Self-pay | Admitting: Internal Medicine

## 2020-05-13 ENCOUNTER — Ambulatory Visit (HOSPITAL_COMMUNITY): Payer: Medicare Other

## 2020-05-13 NOTE — Telephone Encounter (Signed)
R/s appt per 5/9 sch msg. Pt aware.  

## 2020-05-14 ENCOUNTER — Ambulatory Visit: Payer: Medicare Other | Admitting: Internal Medicine

## 2020-05-17 ENCOUNTER — Telehealth: Payer: Self-pay | Admitting: Medical Oncology

## 2020-05-17 NOTE — Telephone Encounter (Signed)
June 7th CT scan cancelled -There is no contrast dye available . Radiology will r/s .

## 2020-05-22 ENCOUNTER — Ambulatory Visit: Payer: Medicare Other | Admitting: Internal Medicine

## 2020-05-28 ENCOUNTER — Encounter: Payer: Self-pay | Admitting: Neurology

## 2020-05-28 ENCOUNTER — Other Ambulatory Visit: Payer: Self-pay | Admitting: Internal Medicine

## 2020-05-28 ENCOUNTER — Ambulatory Visit: Payer: Medicare Other | Admitting: Neurology

## 2020-05-28 VITALS — BP 105/65 | HR 89 | Ht 64.0 in | Wt 141.5 lb

## 2020-05-28 DIAGNOSIS — H539 Unspecified visual disturbance: Secondary | ICD-10-CM | POA: Diagnosis not present

## 2020-05-28 DIAGNOSIS — R197 Diarrhea, unspecified: Secondary | ICD-10-CM | POA: Diagnosis not present

## 2020-05-28 DIAGNOSIS — C7A090 Malignant carcinoid tumor of the bronchus and lung: Secondary | ICD-10-CM

## 2020-05-28 DIAGNOSIS — R232 Flushing: Secondary | ICD-10-CM

## 2020-05-28 DIAGNOSIS — C3490 Malignant neoplasm of unspecified part of unspecified bronchus or lung: Secondary | ICD-10-CM

## 2020-05-28 NOTE — Progress Notes (Signed)
[facility]  PATIENT: Shelly [name] DOB: [birth year]  REFERRING DOCTOR OR PCP: Shelly Pepper, MD SOURCE: Patient, notes from primary care, laboratory results, imaging results,  _________________________________   HISTORICAL  CHIEF COMPLAINT:  Chief Complaint  Patient presents with  . New Patient (Initial Visit)    RM 13 w/ husband. Paper referral from Shelly Pepper, MD for dizziness, eye/head pressure. This started about a year ago. Has seen PCP/ENT/allergist. Told her sx miught be neuro. Has dizziness/double vision every 7 days followed congestion, runny nose, sneezing and earache 2 days later. Took dexamethasone 52morally after tooth extracted 05/02/20. Sx improved for 3 weeks. Zpack and clindamycin ineffective.     HISTORY OF PRESENT ILLNESS:  I had the pleasure of seeing your patient, Shelly [name] [facility] for neurologic consultation regarding her intermittent episodes of visual changes and other neurologic symptoms.  She is a 75year old woman who has had episodes of visual changes, worse in the last 6 months.   They are fairly regular.   Every 7 days or so, she has an episode of reduced visual focus lasting 2 days.  She also notes lightheaded and feels less able to think.   She will feel flushed.  No associated GI issues during an episode.   The third day, she gets attacks of a runny nose and coughing/sneezing.   When the visual symptoms occur, she does not note diplopia.  She denies headache but has a pressure sensation.    She is going to have a dental implant and was given dexamethasone x 4 days.   Her attacks stopped x 3 weeks after taking the steroids.     She had a carcinoid tumor in her lung (right upper lobe) that was resected in 2020.   Follow-up CT scan apparently did not show recurrence.  She has had mild bilateral ptosis at least 10 years  Laboratory test 05/12/2019 showed normal CBC and CMP  CT of the chest  05/12/2019 showed:   1. Interval resection of posterior right upper lobe pulmonary nodule. Soft tissue thickening at the resection site, without residual or recurrent mass.  2. Similar bilateral pulmonary nodules, favoring a benign etiology. Areas of left upper lobe and lingular presumed mucoid impaction. 3. Aortic Atherosclerosis (ICD10-I70.0) and Emphysema (ICD10-J43.9).  REVIEW OF SYSTEMS: Constitutional: No fevers, chills, sweats, or change in appetite Eyes: No visual changes, double vision, eye pain Ear, nose and throat: No hearing loss, ear pain, nasal congestion, sore throat Cardiovascular: No chest pain, palpitations Respiratory: No shortness of breath at rest or with exertion.   No wheezes GastrointestinaI: No nausea, vomiting, diarrhea, abdominal pain, fecal incontinence Genitourinary: No dysuria, urinary retention or frequency.  No nocturia. Musculoskeletal: No neck pain, back pain Integumentary: No rash, pruritus, skin lesions Neurological: as above Psychiatric: No depression at this time.  No anxiety Endocrine: No palpitations, diaphoresis, change in appetite, change in weigh or increased thirst Hematologic/Lymphatic: No anemia, purpura, petechiae. Allergic/Immunologic: No itchy/runny eyes, nasal congestion, recent allergic reactions, rashes  ALLERGIES: Allergies  Allergen Reactions  . Alpha-Gal Anaphylaxis    Throat swelling.  . Cephalosporins     Throat swollen  . Gelatin Anaphylaxis  . Doxycycline   . Gluten Diarrhea and Nausea Only  . Hyoscyamine Sulfate Nausea Only  . Clindamycin Rash  . Cortisone Rash  . Penicillins Rash    Did it involve swelling of the face/tongue/throat, SOB, or low BP? No Did it involve sudden or severe rash/hives, skin  peeling, or any reaction on the inside of your mouth or nose? Yes Did you need to seek medical attention at a hospital or doctor's office? No When did it last happen?5 years ago (~2015) If all above answers are "NO",  may proceed with cephalosporin use.   . Prednisone Rash  . Sulfonamide Derivatives Rash    HOME MEDICATIONS:  Current Outpatient Medications:  .  acetaminophen (TYLENOL) 500 MG tablet, Take 1,000 mg by mouth every 6 (six) hours as needed (headaches.). , Disp: , Rfl:  .  cetirizine (ZYRTEC) 10 MG tablet, Take 5 mg by mouth daily as needed for allergies., Disp: , Rfl:  .  ibuprofen (ADVIL,MOTRIN) 200 MG tablet, Take 200 mg by mouth every 6 (six) hours as needed for mild pain (muscle pain.). , Disp: , Rfl:  .  meclizine (ANTIVERT) 25 MG tablet, Take 12.5-25 mg by mouth 2 (two) times daily as needed., Disp: , Rfl:  .  pseudoephedrine (SUDAFED) 30 MG tablet, Take 15 mg by mouth every 4 (four) hours as needed for congestion. , Disp: , Rfl:  .  ipratropium (ATROVENT) 0.06 % nasal spray, Place 2 sprays into the nose 4 (four) times daily as needed (congestion.). , Disp: , Rfl:   PAST MEDICAL HISTORY: Past Medical History:  Diagnosis Date  . Allergic rhinitis   . Anxiety   . Arthritis    right hand  . High cholesterol   . Hypotension   . Transient gluten sensitivity    diarrhea and stomaCH pains  . Vasomotor rhinitis     PAST SURGICAL HISTORY: Past Surgical History:  Procedure Laterality Date  . EUS N/A 06/28/2013   Procedure: ESOPHAGEAL ENDOSCOPIC ULTRASOUND (EUS) RADIAL;  Surgeon: Shelly Silence, MD;  Location: [facility];  Service: Endoscopy;  Laterality: N/A;  . GALLBLADDER SURGERY  2005  . GANGLION CYST EXCISION Left 2003  . TUBAL LIGATION  1981  . VIDEO ASSISTED THORACOSCOPY (VATS)/WEDGE RESECTION Right 10/03/2018   Procedure: VIDEO ASSISTED THORACOSCOPY (VATS)/WEDGE RESECTION with multiple node dissection.;  Surgeon: Shelly Nakayama, MD;  [facility];  Service: Thoracic;  Laterality: Right;    FAMILY HISTORY: Family History  Problem Relation Age of Onset  . Prostate cancer Father   . Emphysema Father   . Other Sister        Corticobasal syndrome  . ALS Maternal  Uncle     SOCIAL HISTORY:  Social History   Socioeconomic History  . Marital status: Widowed    Spouse name: Shelly [name]  . Number of children: 2  . Years of education: 41  . Highest education level: Not on file  Occupational History  . Occupation: Retired  Tobacco Use  . Smoking status: Former Smoker    Packs/day: 0.25    Years: 57.00    Pack years: 14.25    Types: Cigarettes    Quit date: 09/29/2018    Years since quitting: 1.6  . Smokeless tobacco: Never Used  Vaping Use  . Vaping Use: Never used  Substance and Sexual Activity  . Alcohol use: No  . Drug use: No  . Sexual activity: Not on file  Other Topics Concern  . Not on file  Social History Narrative   2 cups coffee per day   Right handed   Social Determinants of Health   Financial Resource Strain: Not on file  Food Insecurity: Not on file  Transportation Needs: Not on file  Physical Activity: Not on file  Stress: Not on file  Social Connections: Not on file  Intimate Partner Violence: Not on file     PHYSICAL EXAM  Vitals:   05/28/20 1430  BP: 105/65  Pulse: 89  SpO2: 97%  Weight: 141 lb 8 oz (64.2 kg)  Height: 5' 4"  (1.626 m)    Body mass index is 24.29 kg/m.   Hearing Screening   125Hz  250Hz  500Hz  1000Hz  2000Hz  3000Hz  4000Hz  6000Hz  8000Hz   Right ear:           Left ear:             Visual Acuity Screening   Right eye Left eye Both eyes  Without correction:     With correction: 20/40 20/30 20/20      General: The patient is well-developed and well-nourished and in no acute distress  HEENT:  Head is [place]/AT.  Sclera are anicteric.  Funduscopic exam shows normal optic discs and retinal vessels.  Neck: No carotid bruits are noted.  The neck is nontender.  Cardiovascular: The heart has a regular rate and rhythm with a normal S1 and S2. There were no murmurs, gallops or rubs.    Skin: Extremities are without rash or  edema.  Musculoskeletal:  Back is nontender  Neurologic  Exam  Mental status: The patient is alert and oriented x 3 at the time of the examination. The patient has apparent normal recent and remote memory, with an apparently normal attention span and concentration ability.   Speech is normal.  Cranial nerves: Extraocular movements are full. Pupils are equal, round, and reactive to light and accomodation.  Visual fields are full.  Facial symmetry is present. There is good facial sensation to soft touch bilaterally.Facial strength is normal.  Trapezius and sternocleidomastoid strength is normal. No dysarthria is noted.  The tongue is midline, and the patient has symmetric elevation of the soft palate. No obvious hearing deficits are noted.  Motor:  Muscle bulk is normal.   Tone is normal. Strength is  5 / 5 in all 4 extremities.   Sensory: Sensory testing is intact to pinprick, soft touch and vibration sensation in all 4 extremities.  Coordination: Cerebellar testing reveals good finger-nose-finger and heel-to-shin bilaterally.  Gait and station: Station is normal.   Gait is normal. Tandem gait is normal. Romberg is negative.   Reflexes: Deep tendon reflexes are symmetric and normal bilaterally.   Plantar responses are flexor.    DIAGNOSTIC DATA (LABS, IMAGING, TESTING) - I reviewed patient records, labs, notes, testing and imaging myself where available.  Lab Results  Component Value Date   WBC 7.2 05/12/2019   HGB 12.6 05/12/2019   HCT 39.2 05/12/2019   MCV 95.6 05/12/2019   PLT 369 05/12/2019      Component Value Date/Time   NA 143 05/12/2019 1227   K 3.9 05/12/2019 1227   CL 106 05/12/2019 1227   CO2 26 05/12/2019 1227   GLUCOSE 102 (H) 05/12/2019 1227   BUN 11 05/12/2019 1227   CREATININE 0.83 05/12/2019 1227   CALCIUM 9.4 05/12/2019 1227   PROT 7.3 05/12/2019 1227   ALBUMIN 4.0 05/12/2019 1227   AST 18 05/12/2019 1227   ALT 13 05/12/2019 1227   ALKPHOS 77 05/12/2019 1227   BILITOT 0.3 05/12/2019 1227   GFRNONAA >60  05/12/2019 1227   GFRAA >60 05/12/2019 1227       ASSESSMENT AND PLAN  Malignant carcinoid tumor of the bronchus and lung ([place]) - Plan: Serotonin serum, Multiple Myeloma Panel (SPEP&IFE w/QIG), 5-HIAA, Plasma, 5  HIAA, quantitative, urine, 24 hour  Diarrhea, unspecified type  Vision disturbance  Flushing   In summary, Ms. Couse is a 75 year old woman with episodes of flushing, visual change, lightheadedness occurring every 7 days and lasting 2 to 3 days.  Additionally, she has diarrhea.  Although she has had a carcinoid tumor resected, I am concerned that her symptoms could be consistent with a carcinoid syndrome caused by secretion of serotonin from this type of tumor.  Therefore, we will check serum 5 HIAA and serotonin and a 24-hour urine for 5-HIAA serotonin.  If this is occurring, Sandostatin injections might be of benefit to resolve her symptoms.  If this is negative and symptoms worsen we will consider neuroimaging.  She has some ptosis but I believe that is unrelated to her symptoms as it has been present for many years and is unchanged over the past few years.  We will let her know the results of the studies and see her back as needed.  She is advised to call with new or worsening neurologic symptoms.  Thank you for asking me to see Ms. Kapic.  Please let me know if I can be of further assistance with her or other patients in the future.  Minka Knight A. Felecia Shelling, MD, [facility] 07/27/7735, 5:05 PM Certified in Neurology, Clinical Neurophysiology, Sleep Medicine and Neuroimaging  [facility] [address], [place], [place] [address] [phone]

## 2020-05-31 ENCOUNTER — Other Ambulatory Visit: Payer: Self-pay

## 2020-05-31 DIAGNOSIS — C7A090 Malignant carcinoid tumor of the bronchus and lung: Secondary | ICD-10-CM | POA: Diagnosis not present

## 2020-06-04 LAB — MULTIPLE MYELOMA PANEL, SERUM
Albumin SerPl Elph-Mcnc: 3.8 g/dL (ref 2.9–4.4)
Albumin/Glob SerPl: 1.4 (ref 0.7–1.7)
Alpha 1: 0.2 g/dL (ref 0.0–0.4)
Alpha2 Glob SerPl Elph-Mcnc: 0.8 g/dL (ref 0.4–1.0)
B-Globulin SerPl Elph-Mcnc: 1.1 g/dL (ref 0.7–1.3)
Gamma Glob SerPl Elph-Mcnc: 0.8 g/dL (ref 0.4–1.8)
Globulin, Total: 2.8 g/dL (ref 2.2–3.9)
IgA/Immunoglobulin A, Serum: 109 mg/dL (ref 64–422)
IgG (Immunoglobin G), Serum: 844 mg/dL (ref 586–1602)
IgM (Immunoglobulin M), Srm: 90 mg/dL (ref 26–217)
Total Protein: 6.6 g/dL (ref 6.0–8.5)

## 2020-06-04 LAB — SEROTONIN SERUM: Serotonin, Serum: 183 ng/mL (ref 0–420)

## 2020-06-06 ENCOUNTER — Telehealth: Payer: Self-pay | Admitting: Neurology

## 2020-06-06 LAB — 5-HIAA, PLASMA: 5-HIAA, Plasma: 7.5 ng/mL

## 2020-06-06 NOTE — Telephone Encounter (Signed)
Please let her know that we got the blood work back and it was normal.  We are still waiting for the results of the urine test.

## 2020-06-06 NOTE — Telephone Encounter (Signed)
Pt called stating that she has not been informed on what the next step is now that she has done her lab work. Pt doesn't know if she is to schedule a f/u or what else is she to do. Please advise.

## 2020-06-10 DIAGNOSIS — Z1231 Encounter for screening mammogram for malignant neoplasm of breast: Secondary | ICD-10-CM | POA: Diagnosis not present

## 2020-06-10 NOTE — Telephone Encounter (Signed)
Called and spoke to patient and husband, asked about the next steps once urine has resulted.  Went over his plan of positive result versus negative result.    They will also check her MyChart for results and will call back for further orders.  Patient denied further questions, verbalized understanding and expressed appreciation for the phone call.

## 2020-06-11 ENCOUNTER — Ambulatory Visit: Payer: Medicare Other | Admitting: Internal Medicine

## 2020-06-12 LAB — 5 HIAA, QUANTITATIVE, URINE, 24 HOUR
5-HIAA, Ur: 2 mg/L
5-HIAA,Quant.,24 Hr Urine: 3 mg/24 hr (ref 0.0–14.9)

## 2020-06-14 ENCOUNTER — Other Ambulatory Visit: Payer: Medicare Other

## 2020-06-14 ENCOUNTER — Ambulatory Visit (HOSPITAL_COMMUNITY): Payer: Medicare Other

## 2020-06-14 ENCOUNTER — Telehealth: Payer: Self-pay | Admitting: Neurology

## 2020-06-14 ENCOUNTER — Telehealth: Payer: Self-pay | Admitting: Internal Medicine

## 2020-06-14 DIAGNOSIS — H814 Vertigo of central origin: Secondary | ICD-10-CM

## 2020-06-14 NOTE — Telephone Encounter (Signed)
Lab work for carcinoid syndrome including 5 HIAA 24-hour urine and serum serotonin has returned negative.  Therefore, carcinoid syndrome is less likely to be an explanation for her intermittent symptoms.  He is experiencing dizziness, vertigo and nausea.  I will send in an order to check an MRI of the brain with the IAC protocol to rule out vestibular nerve pathology

## 2020-06-14 NOTE — Telephone Encounter (Signed)
Cancelled appointment per 06/09 sch msg. Patient will call back to reschedule.

## 2020-06-17 ENCOUNTER — Other Ambulatory Visit: Payer: Self-pay

## 2020-06-17 ENCOUNTER — Ambulatory Visit (HOSPITAL_COMMUNITY)
Admission: RE | Admit: 2020-06-17 | Discharge: 2020-06-17 | Disposition: A | Discharge status: Home / Self Care | Payer: Medicare Other | Source: Ambulatory Visit | Attending: Internal Medicine | Admitting: Internal Medicine

## 2020-06-17 ENCOUNTER — Inpatient Hospital Stay: Payer: Medicare Other | Attending: Internal Medicine

## 2020-06-17 ENCOUNTER — Telehealth: Payer: Self-pay | Admitting: Neurology

## 2020-06-17 DIAGNOSIS — Z880 Allergy status to penicillin: Secondary | ICD-10-CM | POA: Diagnosis not present

## 2020-06-17 DIAGNOSIS — C3490 Malignant neoplasm of unspecified part of unspecified bronchus or lung: Secondary | ICD-10-CM | POA: Diagnosis not present

## 2020-06-17 DIAGNOSIS — R918 Other nonspecific abnormal finding of lung field: Secondary | ICD-10-CM | POA: Insufficient documentation

## 2020-06-17 DIAGNOSIS — Z9049 Acquired absence of other specified parts of digestive tract: Secondary | ICD-10-CM | POA: Diagnosis not present

## 2020-06-17 DIAGNOSIS — C3431 Malignant neoplasm of lower lobe, right bronchus or lung: Secondary | ICD-10-CM | POA: Insufficient documentation

## 2020-06-17 DIAGNOSIS — I700 Atherosclerosis of aorta: Secondary | ICD-10-CM | POA: Diagnosis not present

## 2020-06-17 DIAGNOSIS — Z79899 Other long term (current) drug therapy: Secondary | ICD-10-CM | POA: Diagnosis not present

## 2020-06-17 DIAGNOSIS — Z881 Allergy status to other antibiotic agents status: Secondary | ICD-10-CM | POA: Insufficient documentation

## 2020-06-17 DIAGNOSIS — Z888 Allergy status to other drugs, medicaments and biological substances status: Secondary | ICD-10-CM | POA: Diagnosis not present

## 2020-06-17 DIAGNOSIS — R42 Dizziness and giddiness: Secondary | ICD-10-CM | POA: Diagnosis not present

## 2020-06-17 DIAGNOSIS — Z85110 Personal history of malignant carcinoid tumor of bronchus and lung: Secondary | ICD-10-CM | POA: Diagnosis not present

## 2020-06-17 DIAGNOSIS — K5730 Diverticulosis of large intestine without perforation or abscess without bleeding: Secondary | ICD-10-CM | POA: Diagnosis not present

## 2020-06-17 DIAGNOSIS — J929 Pleural plaque without asbestos: Secondary | ICD-10-CM | POA: Diagnosis not present

## 2020-06-17 DIAGNOSIS — I2510 Atherosclerotic heart disease of native coronary artery without angina pectoris: Secondary | ICD-10-CM | POA: Diagnosis not present

## 2020-06-17 DIAGNOSIS — Z882 Allergy status to sulfonamides status: Secondary | ICD-10-CM | POA: Diagnosis not present

## 2020-06-17 LAB — CMP (CANCER CENTER ONLY)
ALT: 12 U/L (ref 0–44)
AST: 17 U/L (ref 15–41)
Albumin: 4 g/dL (ref 3.5–5.0)
Alkaline Phosphatase: 69 U/L (ref 38–126)
Anion gap: 10 (ref 5–15)
BUN: 15 mg/dL (ref 8–23)
CO2: 25 mmol/L (ref 22–32)
Calcium: 9.3 mg/dL (ref 8.9–10.3)
Chloride: 105 mmol/L (ref 98–111)
Creatinine: 0.88 mg/dL (ref 0.44–1.00)
GFR, Estimated: >60 mL/min (ref >60)
Glucose, Bld: 108 mg/dL — ABNORMAL HIGH (ref 70–99)
Potassium: 3.7 mmol/L (ref 3.5–5.1)
Sodium: 140 mmol/L (ref 135–145)
Total Bilirubin: 0.5 mg/dL (ref 0.3–1.2)
Total Protein: 7.4 g/dL (ref 6.5–8.1)

## 2020-06-17 LAB — CBC WITH DIFFERENTIAL (CANCER CENTER ONLY)
Abs Immature Granulocytes: 0.05 10*3/uL (ref 0.00–0.07)
Basophils Absolute: 0.1 10*3/uL (ref 0.0–0.1)
Basophils Relative: 1 %
Eosinophils Absolute: 0.2 10*3/uL (ref 0.0–0.5)
Eosinophils Relative: 3 %
HCT: 39.4 % (ref 36.0–46.0)
Hemoglobin: 13.2 g/dL (ref 12.0–15.0)
Immature Granulocytes: 1 %
Lymphocytes Relative: 35 %
Lymphs Abs: 2.4 10*3/uL (ref 0.7–4.0)
MCH: 30.8 pg (ref 26.0–34.0)
MCHC: 33.5 g/dL (ref 30.0–36.0)
MCV: 92.1 fL (ref 80.0–100.0)
Monocytes Absolute: 0.5 10*3/uL (ref 0.1–1.0)
Monocytes Relative: 7 %
Neutro Abs: 3.7 10*3/uL (ref 1.7–7.7)
Neutrophils Relative %: 53 %
Platelet Count: 333 10*3/uL (ref 150–400)
RBC: 4.28 MIL/uL (ref 3.87–5.11)
RDW: 12.6 % (ref 11.5–15.5)
WBC Count: 6.9 10*3/uL (ref 4.0–10.5)
nRBC: 0 % (ref 0.0–0.2)

## 2020-06-17 MED ORDER — SODIUM CHLORIDE (PF) 0.9 % IJ SOLN
INTRAMUSCULAR | Status: AC
  Filled 2020-06-17: qty 50

## 2020-06-17 MED ORDER — IOHEXOL 300 MG/ML  SOLN
75.0000 mL | Freq: Once | INTRAMUSCULAR | Status: AC | PRN
  Administered 2020-06-17: 75 mL via INTRAVENOUS

## 2020-06-17 NOTE — Telephone Encounter (Signed)
MRI brain/iac w/wo contrast auth: npr via [facility] website   sent to GI for scheduling

## 2020-06-19 ENCOUNTER — Inpatient Hospital Stay: Payer: Medicare Other | Admitting: Internal Medicine

## 2020-06-19 ENCOUNTER — Other Ambulatory Visit: Payer: Self-pay

## 2020-06-19 ENCOUNTER — Encounter: Payer: Self-pay | Admitting: Internal Medicine

## 2020-06-19 VITALS — BP 108/63 | HR 76 | Temp 97.0°F | Resp 18 | Ht 64.0 in | Wt 140.9 lb

## 2020-06-19 DIAGNOSIS — Z888 Allergy status to other drugs, medicaments and biological substances status: Secondary | ICD-10-CM | POA: Diagnosis not present

## 2020-06-19 DIAGNOSIS — C3490 Malignant neoplasm of unspecified part of unspecified bronchus or lung: Secondary | ICD-10-CM

## 2020-06-19 DIAGNOSIS — I700 Atherosclerosis of aorta: Secondary | ICD-10-CM | POA: Diagnosis not present

## 2020-06-19 DIAGNOSIS — R918 Other nonspecific abnormal finding of lung field: Secondary | ICD-10-CM | POA: Diagnosis not present

## 2020-06-19 DIAGNOSIS — Z9049 Acquired absence of other specified parts of digestive tract: Secondary | ICD-10-CM | POA: Diagnosis not present

## 2020-06-19 DIAGNOSIS — Z880 Allergy status to penicillin: Secondary | ICD-10-CM | POA: Diagnosis not present

## 2020-06-19 DIAGNOSIS — R42 Dizziness and giddiness: Secondary | ICD-10-CM | POA: Diagnosis not present

## 2020-06-19 DIAGNOSIS — Z881 Allergy status to other antibiotic agents status: Secondary | ICD-10-CM | POA: Diagnosis not present

## 2020-06-19 DIAGNOSIS — C3431 Malignant neoplasm of lower lobe, right bronchus or lung: Secondary | ICD-10-CM | POA: Diagnosis not present

## 2020-06-19 DIAGNOSIS — Z882 Allergy status to sulfonamides status: Secondary | ICD-10-CM | POA: Diagnosis not present

## 2020-06-19 DIAGNOSIS — K5730 Diverticulosis of large intestine without perforation or abscess without bleeding: Secondary | ICD-10-CM | POA: Diagnosis not present

## 2020-06-19 DIAGNOSIS — Z79899 Other long term (current) drug therapy: Secondary | ICD-10-CM | POA: Diagnosis not present

## 2020-06-19 NOTE — Progress Notes (Signed)
[place] [phone]   [phone]  OFFICE PROGRESS NOTE  London Pepper, MD [address] [place] [address]  DIAGNOSIS: stage IA (T1b, N0, M0) typical carcinoid tumor The patient has multiple other pulmonary nodules that need close monitoring and observation.  PRIOR THERAPY: status post wedge resection of right upper lobectomy with lymph node sampling under the care of Dr. Roxan Hockey on 10/03/2018.  CURRENT THERAPY: Observation.  INTERVAL HISTORY: Shelly [name] 75 y.o. female returns to the clinic today for follow-up visit accompanied by her husband.  The patient is feeling fine today with no concerning complaints except for frequent episodes of dizziness and vertigo that has been happening almost every 10 days now.  She was seen by neurology and currently undergoing evaluation for her problem.  She denied having any current chest pain, shortness of breath, cough or hemoptysis.  She denied having any nausea, vomiting, diarrhea or constipation.  She has no headache or visual changes.  She is here today for evaluation and repeat CT scan of the chest for restaging of her disease.   MEDICAL HISTORY: Past Medical History:  Diagnosis Date   Allergic rhinitis    Anxiety    Arthritis    right hand   High cholesterol    Hypotension    Transient gluten sensitivity    diarrhea and stomaCH pains   Vasomotor rhinitis     ALLERGIES:  is allergic to alpha-gal, atorvastatin, cephalosporins, gelatin, meat extract, alendronate sodium, doxycycline, gluten, gluten meal, hyoscyamine sulfate, clindamycin, cortisone, paxil [paroxetine], penicillins, prednisone, and sulfonamide derivatives.  MEDICATIONS:  Current Outpatient Medications  Medication Sig Dispense Refill   acetaminophen (TYLENOL) 500 MG tablet Take 1,000 mg by mouth every 6 (six) hours as needed (headaches.).      cetirizine (ZYRTEC) 10 MG tablet Take 5 mg by mouth daily  as needed for allergies.     ibuprofen (ADVIL,MOTRIN) 200 MG tablet Take 200 mg by mouth every 6 (six) hours as needed for mild pain (muscle pain.).      ipratropium (ATROVENT) 0.06 % nasal spray Place 2 sprays into the nose 4 (four) times daily as needed (congestion.).      meclizine (ANTIVERT) 25 MG tablet Take 12.5-25 mg by mouth 2 (two) times daily as needed.     pseudoephedrine (SUDAFED) 30 MG tablet Take 15 mg by mouth every 4 (four) hours as needed for congestion.      No current facility-administered medications for this visit.    SURGICAL HISTORY:  Past Surgical History:  Procedure Laterality Date   EUS N/A 06/28/2013   Procedure: ESOPHAGEAL ENDOSCOPIC ULTRASOUND (EUS) RADIAL;  Surgeon: Arta Silence, MD;  Location: [facility];  Service: Endoscopy;  Laterality: N/A;   GALLBLADDER SURGERY  2005   GANGLION CYST EXCISION Left 2003   TUBAL LIGATION  1981   VIDEO ASSISTED THORACOSCOPY (VATS)/WEDGE RESECTION Right 10/03/2018   Procedure: VIDEO ASSISTED THORACOSCOPY (VATS)/WEDGE RESECTION with multiple node dissection.;  Surgeon: Melrose Nakayama, MD;  Location: [facility] OR;  Service: Thoracic;  Laterality: Right;    REVIEW OF SYSTEMS:  A comprehensive review of systems was negative except for: Neurological: positive for dizziness   PHYSICAL EXAMINATION: General appearance: alert, cooperative, and no distress Head: Normocephalic, without obvious abnormality, atraumatic Neck: no adenopathy, no JVD, supple, symmetrical, trachea midline, and thyroid not enlarged, symmetric, no tenderness/mass/nodules Lymph nodes: Cervical, supraclavicular, and axillary nodes normal. Resp: clear to auscultation bilaterally Back: symmetric, no curvature.  ROM normal. No CVA tenderness. Cardio: regular rate and rhythm, S1, S2 normal, no murmur, click, rub or gallop GI: soft, non-tender; bowel sounds normal; no masses,  no organomegaly Extremities: extremities normal, atraumatic, no cyanosis or edema  ECOG  PERFORMANCE STATUS: 1 - Symptomatic but completely ambulatory  Blood pressure 108/63, pulse 76, temperature (!) 97 F (36.1 C), temperature source Tympanic, resp. rate 18, height 5\' 4"  (1.626 m), weight 140 lb 14.4 oz (63.9 kg), SpO2 99 %.  LABORATORY DATA: Lab Results  Component Value Date   WBC 6.9 06/17/2020   HGB 13.2 06/17/2020   HCT 39.4 06/17/2020   MCV 92.1 06/17/2020   PLT 333 06/17/2020      Chemistry      Component Value Date/Time   NA 140 06/17/2020 1236   K 3.7 06/17/2020 1236   CL 105 06/17/2020 1236   CO2 25 06/17/2020 1236   BUN 15 06/17/2020 1236   CREATININE 0.88 06/17/2020 1236      Component Value Date/Time   CALCIUM 9.3 06/17/2020 1236   ALKPHOS 69 06/17/2020 1236   AST 17 06/17/2020 1236   ALT 12 06/17/2020 1236   BILITOT 0.5 06/17/2020 1236       RADIOGRAPHIC STUDIES: CT Chest W Contrast  Result Date: 06/17/2020 CLINICAL DATA:  75 year old female with history of non-small cell lung cancer (carcinoid tumor). Follow-up study. EXAM: CT CHEST WITH CONTRAST TECHNIQUE: Multidetector CT imaging of the chest was performed during intravenous contrast administration. CONTRAST:  66mL OMNIPAQUE IOHEXOL 300 MG/ML  SOLN COMPARISON:  Chest CT 03/12/2019. FINDINGS: Cardiovascular: Heart size is normal. There is no significant pericardial fluid, thickening or pericardial calcification. Aortic atherosclerosis. No definite coronary artery calcifications. Mild calcifications of the mitral annulus. Mediastinum/Nodes: No pathologically enlarged mediastinal or hilar lymph nodes. Prominent but nonenlarged right hilar lymph node measuring 9 mm in short axis, stable compared to the prior examination, nonspecific. Esophagus is unremarkable in appearance. No axillary lymphadenopathy. Lungs/Pleura: Postoperative changes of wedge resection are again noted in the posteromedial aspect of the right upper lobe. There is a small amount of soft tissue thickening associated with the suture  line which appear stable compared to the prior examination. Multiple scattered small pulmonary nodules are again noted in the lungs bilaterally, stable in number and size compared to the prior examination, with the largest of these measuring 6 mm in the medial aspect of the right lower lobe (axial image 98 of series 5). No acute consolidative airspace disease. No pleural effusions. Upper Abdomen: Aortic atherosclerosis. Incompletely imaged low-attenuation lesion in the medial aspect of the interpolar region of the left kidney, incompletely characterized but statistically likely to represent a cyst. Subcentimeter low-attenuation lesions in segments 2 and 8 of the liver, too small to characterize, but statistically likely to represent small cysts. Status post cholecystectomy. Colonic diverticulosis. Musculoskeletal: There are no aggressive appearing lytic or blastic lesions noted in the visualized portions of the skeleton. IMPRESSION: 1. Status post wedge resection in the right upper lobe with small amount of soft tissue thickening associated with the suture line which is stable compared to the prior examination. Continued attention on future follow-up imaging is recommended. 2. Other previously noted small pulmonary nodules measuring 6 mm or less in size are stable compared to the prior examination. Many of these appear to reflect areas of chronic mucoid impaction within bronchioles. Continued attention on future follow-up imaging is recommended. 3. Aortic atherosclerosis. 4. Additional incidental findings, as above. Aortic Atherosclerosis (ICD10-I70.0). Electronically Signed   By:  Vinnie Langton M.D.   On: 06/17/2020 21:03     ASSESSMENT AND PLAN: This is a very pleasant 75 years old white female with a stage Ia carcinoid tumor of the lung status post wedge resection of the right upper lobe with lymph node sampling in September 2020 under the care of Dr. Roxan Hockey. The patient has been on observation since  that time and she is feeling fine with no concerning complaints except for the intermittent dizzy spells currently evaluated by neurology. She had repeat CT scan of the chest performed recently.  I personally and independently reviewed the scans and discussed the results with the patient and her husband. Her scan showed no concerning findings for disease recurrence or metastasis. I recommended for her to continue on observation with repeat CT scan of the chest in 1 year. The patient was advised to call immediately if she has any other concerning symptoms in the interval. The patient voices understanding of current disease status and treatment options and is in agreement with the current care plan.  All questions were answered. The patient knows to call the clinic with any problems, questions or concerns. We can certainly see the patient much sooner if necessary.  Disclaimer: This note was dictated with voice recognition software. Similar sounding words can inadvertently be transcribed and may not be corrected upon review.

## 2020-06-21 ENCOUNTER — Other Ambulatory Visit: Payer: Self-pay

## 2020-06-21 ENCOUNTER — Ambulatory Visit
Admission: RE | Admit: 2020-06-21 | Discharge: 2020-06-21 | Disposition: A | Discharge status: Home / Self Care | Payer: Medicare Other | Source: Ambulatory Visit | Attending: Neurology | Admitting: Neurology

## 2020-06-21 DIAGNOSIS — H814 Vertigo of central origin: Secondary | ICD-10-CM

## 2020-06-21 MED ORDER — GADOBENATE DIMEGLUMINE 529 MG/ML IV SOLN
13.0000 mL | Freq: Once | INTRAVENOUS | Status: AC | PRN
  Administered 2020-06-21: 13 mL via INTRAVENOUS

## 2020-06-24 ENCOUNTER — Telehealth: Payer: Self-pay | Admitting: Neurology

## 2020-06-24 DIAGNOSIS — H539 Unspecified visual disturbance: Secondary | ICD-10-CM

## 2020-06-24 DIAGNOSIS — R232 Flushing: Secondary | ICD-10-CM

## 2020-06-24 DIAGNOSIS — H814 Vertigo of central origin: Secondary | ICD-10-CM

## 2020-06-24 DIAGNOSIS — C7A090 Malignant carcinoid tumor of the bronchus and lung: Secondary | ICD-10-CM

## 2020-06-24 NOTE — Telephone Encounter (Signed)
Called pt back. Aware MD out until tomorrow. She has allergy testing tomorrow at 1:40pm. Wants to make sure Dr. Felecia Shelling does not want anything specific tested. Advised we will try to call her back in the morning w/ his response.

## 2020-06-24 NOTE — Telephone Encounter (Signed)
Called and spoke w/ Gershon Mussel. Relayed Dr. Garth Bigness message. He verbalized understanding and will let the pt know.

## 2020-06-24 NOTE — Telephone Encounter (Signed)
Pt is going for an allergy test and would like to know if there is anything specific Dr Felecia Shelling wants looked into, please call.

## 2020-06-25 DIAGNOSIS — L0109 Other impetigo: Secondary | ICD-10-CM | POA: Diagnosis not present

## 2020-06-25 DIAGNOSIS — J3089 Other allergic rhinitis: Secondary | ICD-10-CM | POA: Diagnosis not present

## 2020-06-25 DIAGNOSIS — J300 Vasomotor rhinitis: Secondary | ICD-10-CM | POA: Diagnosis not present

## 2020-06-25 DIAGNOSIS — Z91018 Allergy to other foods: Secondary | ICD-10-CM | POA: Diagnosis not present

## 2020-06-26 ENCOUNTER — Other Ambulatory Visit: Payer: Self-pay | Admitting: Neurology

## 2020-06-26 NOTE — Telephone Encounter (Signed)
Called pt back. Relayed Dr. Garth Bigness message. She is agreeable to second opinion at [facility]. I placed referral and she is aware someone will be reaching out to schedule appt. She will call back if she does not hear about getting appt set up.

## 2020-06-26 NOTE — Telephone Encounter (Signed)
Dr. Felecia Shelling- what would you recommend at this point?

## 2020-06-26 NOTE — Telephone Encounter (Signed)
Pt has called to report that she went to the allergy Dr and was told she is not allergic to anything and that the Dr thinks her problem is neurologic.  Please call

## 2020-06-26 NOTE — Telephone Encounter (Signed)
Pt called, will oral steroid might help while waiting for appt with [facility]. Would like a call from the nurse.

## 2020-06-26 NOTE — Addendum Note (Signed)
Addended by: Wyvonnia Lora on: 06/26/2020 04:21 PM   Modules accepted: Orders

## 2020-06-27 NOTE — Telephone Encounter (Signed)
Called and spoke with pt. Relayed Dr. Garth Bigness message. She verbalized understanding. She asked if we knew of any ENT's she should go to. Advised there is [facility]. Phone#[phone]. She is going to contact them about being seen. Aware they may require referral.

## 2020-07-01 ENCOUNTER — Telehealth: Payer: Self-pay

## 2020-07-01 NOTE — Telephone Encounter (Signed)
Referral for neurology second opinion sent to [facility] via [facility]. P: [phone].

## 2020-07-16 DIAGNOSIS — M9903 Segmental and somatic dysfunction of lumbar region: Secondary | ICD-10-CM | POA: Diagnosis not present

## 2020-07-16 DIAGNOSIS — M530 Cervicocranial syndrome: Secondary | ICD-10-CM | POA: Diagnosis not present

## 2020-07-16 DIAGNOSIS — M9901 Segmental and somatic dysfunction of cervical region: Secondary | ICD-10-CM | POA: Diagnosis not present

## 2020-07-16 DIAGNOSIS — M5441 Lumbago with sciatica, right side: Secondary | ICD-10-CM | POA: Diagnosis not present

## 2020-07-18 DIAGNOSIS — M9901 Segmental and somatic dysfunction of cervical region: Secondary | ICD-10-CM | POA: Diagnosis not present

## 2020-07-18 DIAGNOSIS — M530 Cervicocranial syndrome: Secondary | ICD-10-CM | POA: Diagnosis not present

## 2020-07-18 DIAGNOSIS — M9903 Segmental and somatic dysfunction of lumbar region: Secondary | ICD-10-CM | POA: Diagnosis not present

## 2020-07-18 DIAGNOSIS — M5441 Lumbago with sciatica, right side: Secondary | ICD-10-CM | POA: Diagnosis not present

## 2020-07-22 DIAGNOSIS — M530 Cervicocranial syndrome: Secondary | ICD-10-CM | POA: Diagnosis not present

## 2020-07-22 DIAGNOSIS — M9901 Segmental and somatic dysfunction of cervical region: Secondary | ICD-10-CM | POA: Diagnosis not present

## 2020-07-22 DIAGNOSIS — M9903 Segmental and somatic dysfunction of lumbar region: Secondary | ICD-10-CM | POA: Diagnosis not present

## 2020-07-22 DIAGNOSIS — M5441 Lumbago with sciatica, right side: Secondary | ICD-10-CM | POA: Diagnosis not present

## 2020-07-23 DIAGNOSIS — M9903 Segmental and somatic dysfunction of lumbar region: Secondary | ICD-10-CM | POA: Diagnosis not present

## 2020-07-23 DIAGNOSIS — M530 Cervicocranial syndrome: Secondary | ICD-10-CM | POA: Diagnosis not present

## 2020-07-23 DIAGNOSIS — M9901 Segmental and somatic dysfunction of cervical region: Secondary | ICD-10-CM | POA: Diagnosis not present

## 2020-07-23 DIAGNOSIS — M5441 Lumbago with sciatica, right side: Secondary | ICD-10-CM | POA: Diagnosis not present

## 2020-07-25 DIAGNOSIS — M5441 Lumbago with sciatica, right side: Secondary | ICD-10-CM | POA: Diagnosis not present

## 2020-07-25 DIAGNOSIS — M9903 Segmental and somatic dysfunction of lumbar region: Secondary | ICD-10-CM | POA: Diagnosis not present

## 2020-07-25 DIAGNOSIS — M9901 Segmental and somatic dysfunction of cervical region: Secondary | ICD-10-CM | POA: Diagnosis not present

## 2020-07-25 DIAGNOSIS — M530 Cervicocranial syndrome: Secondary | ICD-10-CM | POA: Diagnosis not present

## 2020-07-29 ENCOUNTER — Emergency Department (HOSPITAL_BASED_OUTPATIENT_CLINIC_OR_DEPARTMENT_OTHER): Payer: Medicare Other | Admitting: Radiology

## 2020-07-29 ENCOUNTER — Emergency Department (HOSPITAL_BASED_OUTPATIENT_CLINIC_OR_DEPARTMENT_OTHER)
Admission: EM | Admit: 2020-07-29 | Discharge: 2020-07-29 | Disposition: A | Discharge status: Home / Self Care | Payer: Medicare Other | Attending: Emergency Medicine | Admitting: Emergency Medicine

## 2020-07-29 ENCOUNTER — Encounter (HOSPITAL_BASED_OUTPATIENT_CLINIC_OR_DEPARTMENT_OTHER): Payer: Self-pay | Admitting: *Deleted

## 2020-07-29 ENCOUNTER — Other Ambulatory Visit: Payer: Self-pay

## 2020-07-29 DIAGNOSIS — R0789 Other chest pain: Secondary | ICD-10-CM

## 2020-07-29 DIAGNOSIS — M62838 Other muscle spasm: Secondary | ICD-10-CM | POA: Diagnosis not present

## 2020-07-29 DIAGNOSIS — R61 Generalized hyperhidrosis: Secondary | ICD-10-CM | POA: Diagnosis not present

## 2020-07-29 DIAGNOSIS — Z87891 Personal history of nicotine dependence: Secondary | ICD-10-CM | POA: Diagnosis not present

## 2020-07-29 DIAGNOSIS — Z86012 Personal history of benign carcinoid tumor: Secondary | ICD-10-CM | POA: Insufficient documentation

## 2020-07-29 DIAGNOSIS — R079 Chest pain, unspecified: Secondary | ICD-10-CM | POA: Diagnosis not present

## 2020-07-29 DIAGNOSIS — M25519 Pain in unspecified shoulder: Secondary | ICD-10-CM | POA: Diagnosis not present

## 2020-07-29 DIAGNOSIS — R000 Tachycardia, unspecified: Secondary | ICD-10-CM | POA: Diagnosis not present

## 2020-07-29 DIAGNOSIS — R911 Solitary pulmonary nodule: Secondary | ICD-10-CM | POA: Diagnosis not present

## 2020-07-29 LAB — BASIC METABOLIC PANEL
Anion gap: 12 (ref 5–15)
BUN: 11 mg/dL (ref 8–23)
CO2: 23 mmol/L (ref 22–32)
Calcium: 9.2 mg/dL (ref 8.9–10.3)
Chloride: 104 mmol/L (ref 98–111)
Creatinine, Ser: 0.75 mg/dL (ref 0.44–1.00)
GFR, Estimated: >60 mL/min (ref >60)
Glucose, Bld: 143 mg/dL — ABNORMAL HIGH (ref 70–99)
Potassium: 3.7 mmol/L (ref 3.5–5.1)
Sodium: 139 mmol/L (ref 135–145)

## 2020-07-29 LAB — CBC
HCT: 38.1 % (ref 36.0–46.0)
Hemoglobin: 12.9 g/dL (ref 12.0–15.0)
MCH: 31 pg (ref 26.0–34.0)
MCHC: 33.9 g/dL (ref 30.0–36.0)
MCV: 91.6 fL (ref 80.0–100.0)
Platelets: 352 10*3/uL (ref 150–400)
RBC: 4.16 MIL/uL (ref 3.87–5.11)
RDW: 12.6 % (ref 11.5–15.5)
WBC: 7.4 10*3/uL (ref 4.0–10.5)
nRBC: 0 % (ref 0.0–0.2)

## 2020-07-29 LAB — TROPONIN I (HIGH SENSITIVITY)
Troponin I (High Sensitivity): 2 ng/L (ref <18)
Troponin I (High Sensitivity): 2 ng/L (ref <18)

## 2020-07-29 NOTE — ED Triage Notes (Signed)
Chest pain radiating to her upper back and tingling sensation to her left hand for 2 days with nausea, lightheadedness and dizziness.

## 2020-07-29 NOTE — ED Provider Notes (Signed)
[facility] Provider Note  CSN: [id number] Arrival date & time: 07/29/20 1400    History Chief Complaint  Patient presents with   Chest Pain    Shelly [name] is a 75 y.o. female with history of carcinoid tumor resection of R upper lobe in 2020 has been cancer free since that time, including surveillance CT about a month ago. She reports she had an episode of sharp stabbing pain in L upper back/shoulder radiating into her L upper chest yesterday. Associated with sweating but no SOB. Symptoms lasted about 67min and improved without intervention. She has continued to have some discomfort since then and so decided to come to the ED for evaluation. She does not have a history of HTN, DM or CAD. She used to smoke but quit in 2020. She    Past Medical History:  Diagnosis Date   Allergic rhinitis    Anxiety    Arthritis    right hand   High cholesterol    Hypotension    Transient gluten sensitivity    diarrhea and stomaCH pains   Vasomotor rhinitis     Past Surgical History:  Procedure Laterality Date   EUS N/A 06/28/2013   Procedure: ESOPHAGEAL ENDOSCOPIC ULTRASOUND (EUS) RADIAL;  Surgeon: Arta Silence, MD;  Location: [facility];  Service: Endoscopy;  Laterality: N/A;   GALLBLADDER SURGERY  2005   GANGLION CYST EXCISION Left 2003   TUBAL LIGATION  1981   VIDEO ASSISTED THORACOSCOPY (VATS)/WEDGE RESECTION Right 10/03/2018   Procedure: VIDEO ASSISTED THORACOSCOPY (VATS)/WEDGE RESECTION with multiple node dissection.;  Surgeon: Melrose Nakayama, MD;  Location: [facility];  Service: Thoracic;  Laterality: Right;    Family History  Problem Relation Age of Onset   Prostate cancer Father    Emphysema Father    Other Sister        Corticobasal syndrome   ALS Maternal Uncle     Social History   Tobacco Use   Smoking status: Former    Packs/day: 0.25    Years: 57.00    Pack years: 14.25    Types: Cigarettes    Quit date: 09/29/2018     Years since quitting: 1.8   Smokeless tobacco: Never  Vaping Use   Vaping Use: Never used  Substance Use Topics   Alcohol use: No   Drug use: No     Home Medications Prior to Admission medications   Medication Sig Start Date End Date Taking? Authorizing Provider  calcium carbonate (OSCAL) 1500 (600 Ca) MG TABS tablet 1 tablet with meals   Yes [provider]  ezetimibe (ZETIA) 10 MG tablet Take 10 mg by mouth daily. 05/03/20  Yes [provider]  meclizine (ANTIVERT) 12.5 MG tablet Take 12.5-25 mg by mouth daily as needed. 03/19/20  Yes [provider]  Pediatric Multivitamins-Iron (CHILD CHEWABLE VITAMINS/IRON) chewable tablet Chew 2 tablets by mouth every other day.   Yes [provider]  VITAMIN B COMPLEX-C PO Take 1 tablet by mouth daily.   Yes [provider]  acetaminophen (TYLENOL) 500 MG tablet Take 1,000 mg by mouth every 6 (six) hours as needed (headaches.).     [provider]  EPINEPHrine 0.3 mg/0.3 mL IJ SOAJ injection Inject into the muscle as directed. 04/25/20   [provider]  ibuprofen (ADVIL,MOTRIN) 200 MG tablet Take 200 mg by mouth every 6 (six) hours as needed for mild pain (muscle pain.).     [provider]  Allergies    Alpha-gal, Atorvastatin, Cephalosporins, Gelatin, Meat extract, Alendronate sodium, Doxycycline, Gluten, Gluten meal, Hyoscyamine sulfate, Clindamycin, Cortisone, Paxil [paroxetine], Penicillins, Prednisone, and Sulfonamide derivatives   Review of Systems   Review of Systems A comprehensive review of systems was completed and negative except as noted in HPI.    Physical Exam BP 119/64   Pulse 93   Temp 99.3 F (37.4 C)   Resp (!) 35   Ht 5\' 4"  (1.626 m)   Wt 63.5 kg   SpO2 98%   BMI 24.03 kg/m   Physical Exam Vitals and nursing note reviewed.  Constitutional:      Appearance: Normal appearance.  HENT:     Head: Normocephalic and atraumatic.     Nose:  Nose normal.     Mouth/Throat:     Mouth: Mucous membranes are moist.  Eyes:     Extraocular Movements: Extraocular movements intact.     Conjunctiva/sclera: Conjunctivae normal.  Cardiovascular:     Rate and Rhythm: Normal rate.  Pulmonary:     Effort: Pulmonary effort is normal.     Breath sounds: Normal breath sounds.  Abdominal:     General: Abdomen is flat.     Palpations: Abdomen is soft.     Tenderness: There is no abdominal tenderness.  Musculoskeletal:        General: No swelling. Normal range of motion.     Cervical back: Neck supple.     Comments: Tenderness to palpation of the L trapezius muscle reproduces her symptoms  Skin:    General: Skin is warm and dry.  Neurological:     General: No focal deficit present.     Mental Status: She is alert.  Psychiatric:        Mood and Affect: Mood normal.     ED Results / Procedures / Treatments   Labs (all labs ordered are listed, but only abnormal results are displayed) Labs Reviewed  BASIC METABOLIC PANEL - Abnormal; Notable for the following components:      Result Value   Glucose, Bld 143 (*)    All other components within normal limits  CBC  TROPONIN I (HIGH SENSITIVITY)  TROPONIN I (HIGH SENSITIVITY)    EKG EKG Interpretation  Date/Time:  Monday July 29 2020 14:24:40 EDT Ventricular Rate:  103 PR Interval:  132 QRS Duration: 86 QT Interval:  336 QTC Calculation: 440 R Axis:   61 Text Interpretation: Sinus tachycardia Nonspecific T wave abnormality Abnormal ECG Since last tracing Nonspecific ST abnormality NOW PRESENT Confirmed by Calvert Cantor [phone]) on 07/29/2020 6:29:19 PM   Radiology DG Chest 2 View  Result Date: 07/29/2020 CLINICAL DATA:  Chest pain EXAM: CHEST - 2 VIEW COMPARISON:  10/03/2019 radiograph, CT 06/17/2020 FINDINGS: Unchanged cardiomediastinal silhouette. Chronic postoperative changes in the right upper lung. There is no new focal airspace disease. There is no large pleural  effusion or visible pneumothorax. Known pulmonary nodules are best seen on prior CT. IMPRESSION: No evidence of acute cardiopulmonary disease. Electronically Signed   By: Maurine Simmering   On: 07/29/2020 15:11    Procedures Procedures  Medications Ordered in the ED Medications - No data to display   MDM Rules/Calculators/A&P MDM Patient with atypical, reproducible L upper back/chest pain. Low suspicion for ACS, PE or dissection. Will check labs including delta trop. If negative anticipate outpatient management.   ED Course  I have reviewed the triage vital signs and the nursing notes.  Pertinent labs & imaging results that  were available during my care of the patient were reviewed by me and considered in my medical decision making (see chart for details).  Clinical Course as of 07/29/20 2002  Mon Jul 29, 2020  1909 CBC, CMP and Trop #1 are normal. CXR is neg for acute process.  [CS]  2000 Second Trop remains normal. Pain is likely MSK. Plan discharge with close PCP follow up. Patient states she will take Motrin when she gets home.  [CS]    Clinical Course User Index [CS] Truddie Hidden, MD    Final Clinical Impression(s) / ED Diagnoses Final diagnoses:  Muscle spasm of left shoulder  Atypical chest pain    Rx / DC Orders ED Discharge Orders     None        Truddie Hidden, MD 07/29/20 2002

## 2020-07-30 DIAGNOSIS — M9903 Segmental and somatic dysfunction of lumbar region: Secondary | ICD-10-CM | POA: Diagnosis not present

## 2020-07-30 DIAGNOSIS — M9901 Segmental and somatic dysfunction of cervical region: Secondary | ICD-10-CM | POA: Diagnosis not present

## 2020-07-30 DIAGNOSIS — M5441 Lumbago with sciatica, right side: Secondary | ICD-10-CM | POA: Diagnosis not present

## 2020-07-30 DIAGNOSIS — M530 Cervicocranial syndrome: Secondary | ICD-10-CM | POA: Diagnosis not present

## 2020-08-01 DIAGNOSIS — M530 Cervicocranial syndrome: Secondary | ICD-10-CM | POA: Diagnosis not present

## 2020-08-01 DIAGNOSIS — M5441 Lumbago with sciatica, right side: Secondary | ICD-10-CM | POA: Diagnosis not present

## 2020-08-01 DIAGNOSIS — M9901 Segmental and somatic dysfunction of cervical region: Secondary | ICD-10-CM | POA: Diagnosis not present

## 2020-08-01 DIAGNOSIS — M9903 Segmental and somatic dysfunction of lumbar region: Secondary | ICD-10-CM | POA: Diagnosis not present

## 2020-08-02 DIAGNOSIS — M530 Cervicocranial syndrome: Secondary | ICD-10-CM | POA: Diagnosis not present

## 2020-08-02 DIAGNOSIS — M5441 Lumbago with sciatica, right side: Secondary | ICD-10-CM | POA: Diagnosis not present

## 2020-08-02 DIAGNOSIS — M9901 Segmental and somatic dysfunction of cervical region: Secondary | ICD-10-CM | POA: Diagnosis not present

## 2020-08-02 DIAGNOSIS — M9903 Segmental and somatic dysfunction of lumbar region: Secondary | ICD-10-CM | POA: Diagnosis not present

## 2020-08-05 DIAGNOSIS — M9903 Segmental and somatic dysfunction of lumbar region: Secondary | ICD-10-CM | POA: Diagnosis not present

## 2020-08-05 DIAGNOSIS — M9901 Segmental and somatic dysfunction of cervical region: Secondary | ICD-10-CM | POA: Diagnosis not present

## 2020-08-05 DIAGNOSIS — M530 Cervicocranial syndrome: Secondary | ICD-10-CM | POA: Diagnosis not present

## 2020-08-05 DIAGNOSIS — M5441 Lumbago with sciatica, right side: Secondary | ICD-10-CM | POA: Diagnosis not present

## 2020-08-09 IMAGING — DX DG CHEST 1V PORT
1 series · 1 of 1 positions shown · non-contrast
Comparison: Chest x-rays from October 03, 2018. PET-CT July 19, 2018.

CLINICAL DATA: Chest tube.  Evaluate pneumothorax.

EXAM:
PORTABLE CHEST 1 VIEW

[chest]
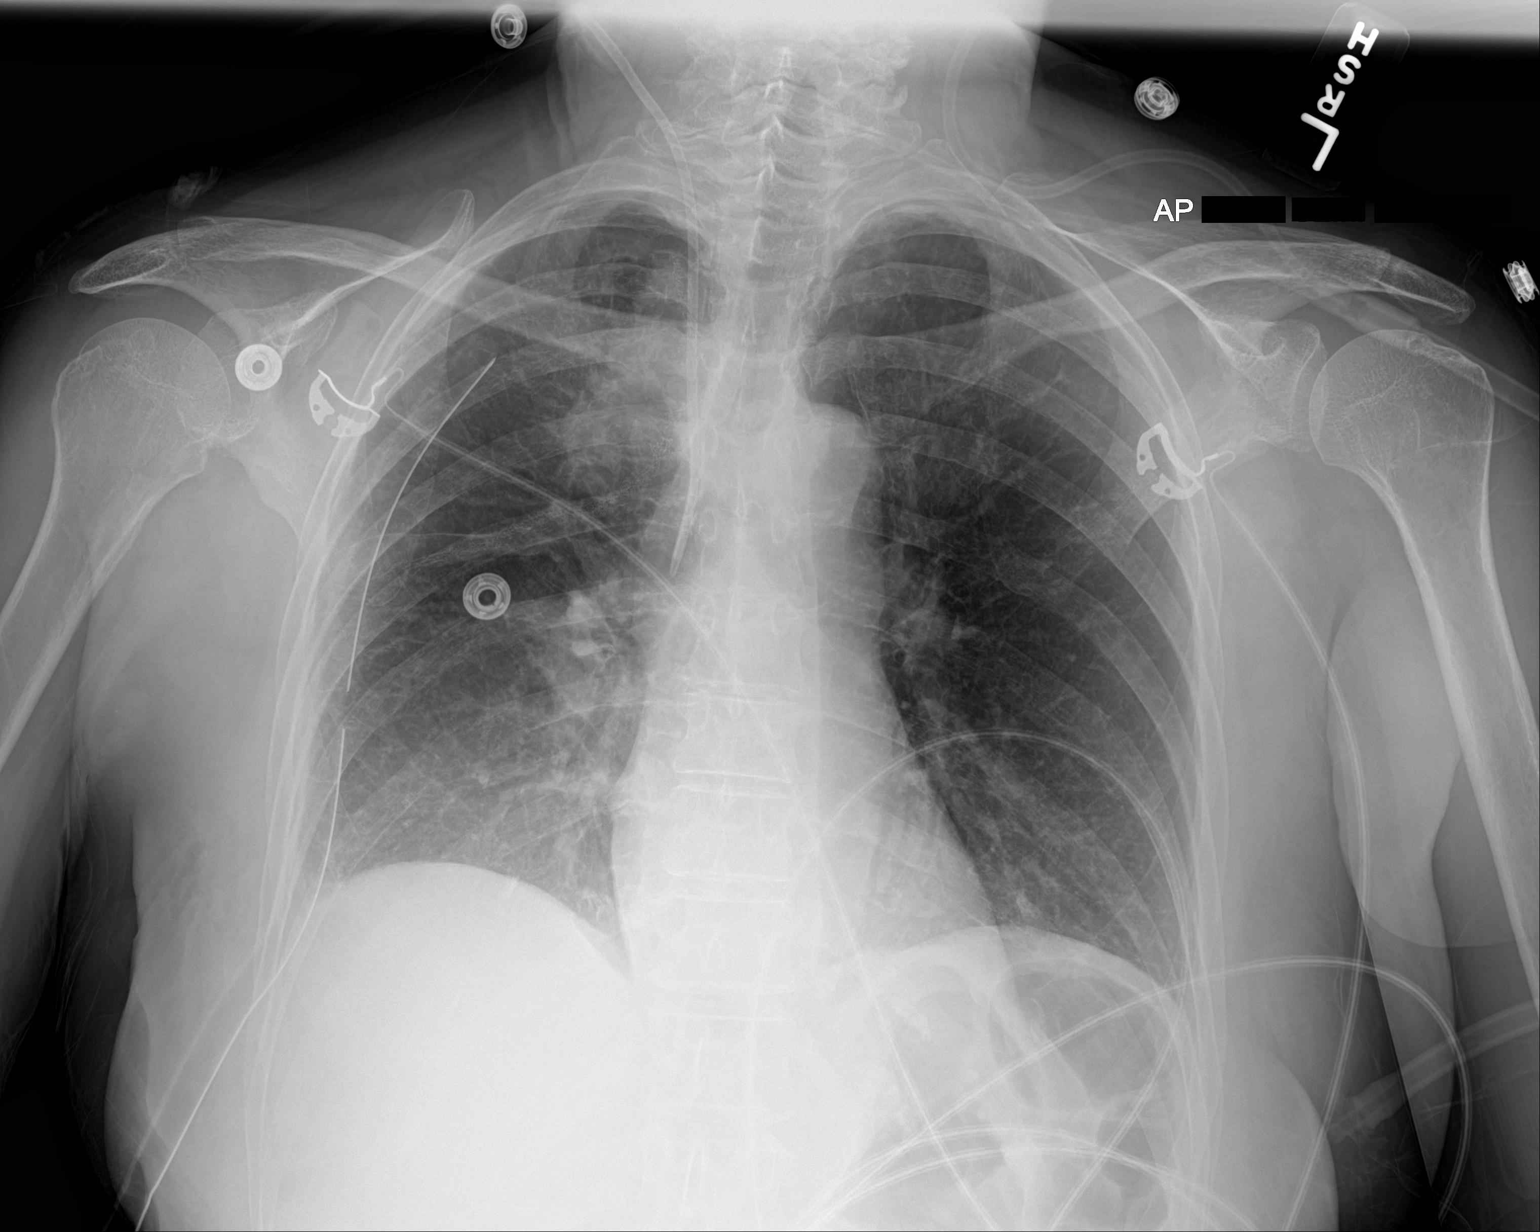

[1 of 1 positions shown; findings below may reference images not displayed]

FINDINGS: A right chest tube remains in place. No definitive pneumothorax is
identified. Suture lines are seen in the medial right upper lobe.
There is increasing oval density just lateral to the suture line. No
abnormality was seen in this region on the comparison PET-CT or the
first chest x-ray from October 03, 2018. The right central line is
stable. The left lung is clear. The cardiomediastinal silhouette is
otherwise unchanged.
IMPRESSION: 1. The increased oval opacity lateral to the suture lines in the
medial right apex were not present on the October 03, 2018 study
prior to surgery and have increased since the first postoperative
study. The findings are likely postoperative in nature such as a
hematoma. Recommend attention on follow-up.
2. Support apparatus as above.  No pneumothorax on the right.

## 2020-08-10 IMAGING — DX DG CHEST 1V PORT
1 series · 1 of 1 positions shown · non-contrast
Comparison: 10/04/2018

CLINICAL DATA: Chest tube

EXAM:
PORTABLE CHEST 1 VIEW

[chest ap]
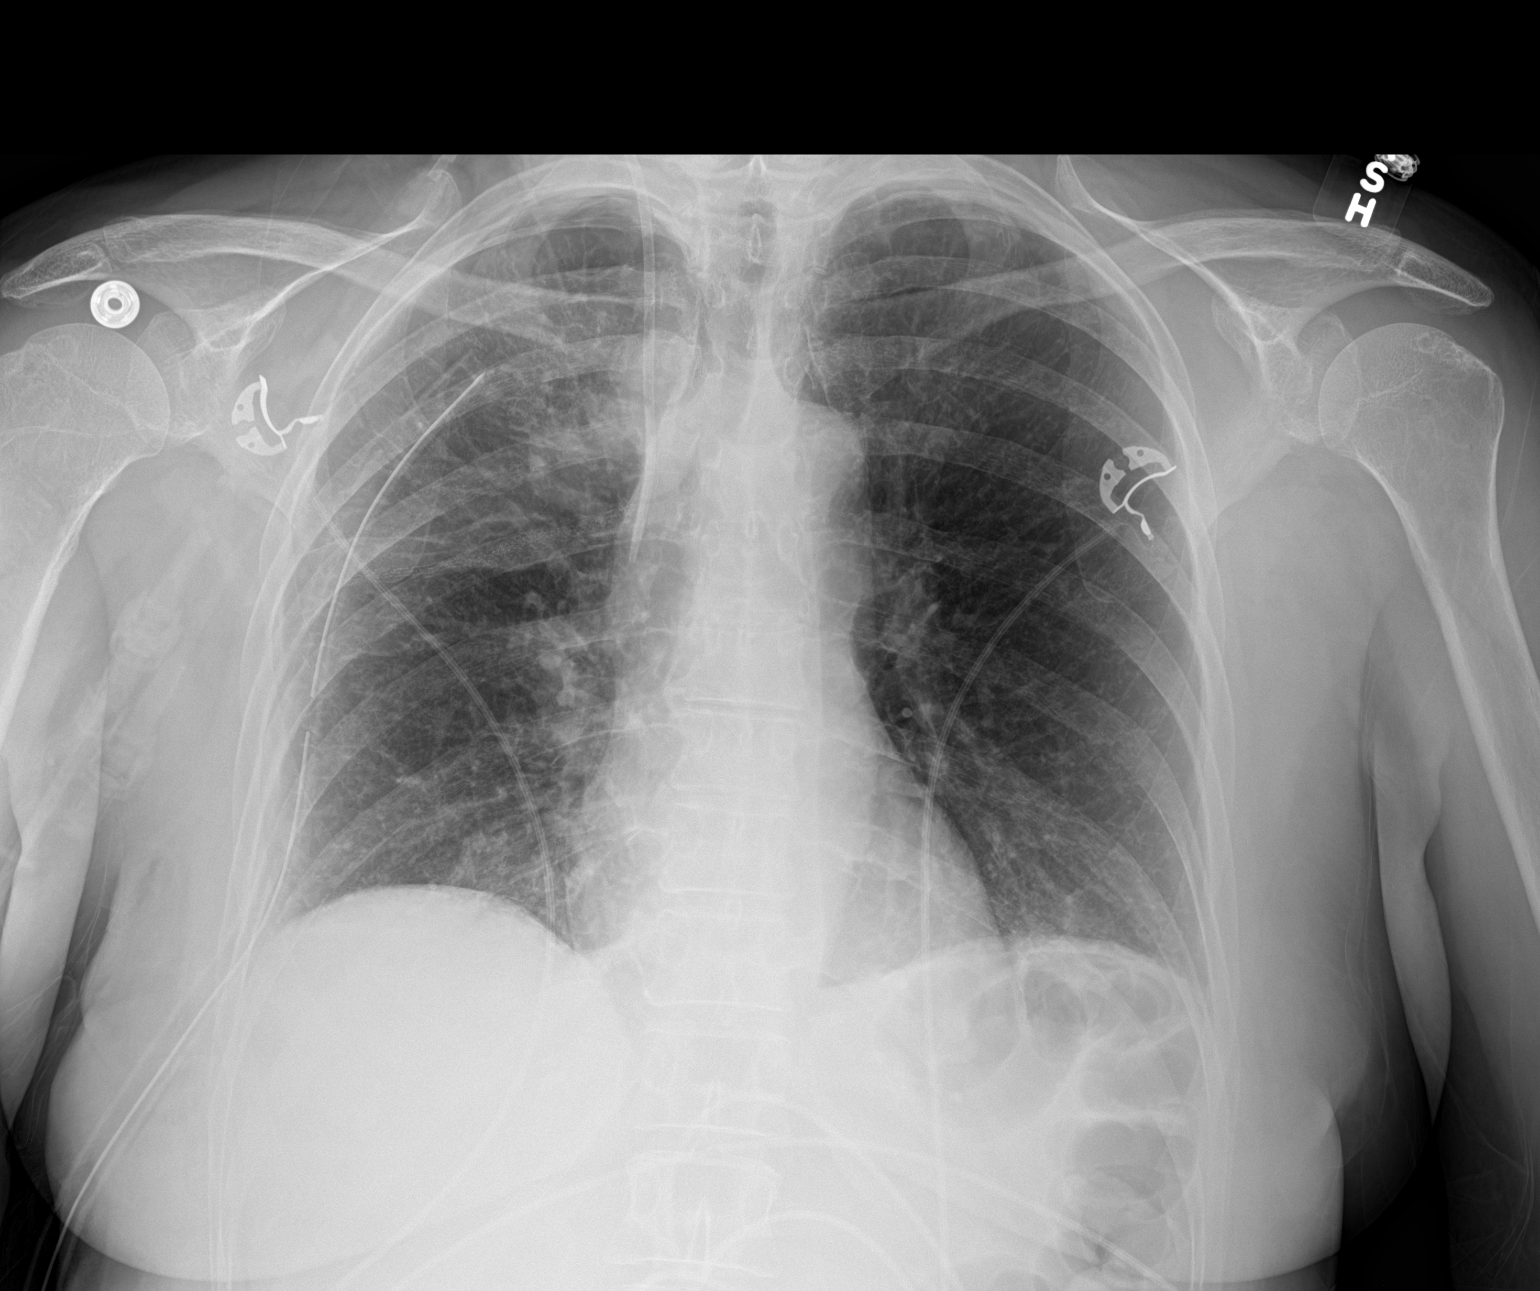

[1 of 1 positions shown; findings below may reference images not displayed]

FINDINGS: Right chest tube and central line remain in place, unchanged. No
pneumothorax. Heart is normal size. Medial right upper lobe density
again noted, similar prior study. Left lung clear. No effusions.
IMPRESSION: Right chest tube in place.  No visible pneumothorax.

Medial right upper lobe airspace densities similar prior study

## 2020-08-11 IMAGING — DX DG CHEST 1V PORT
1 series · 1 of 1 positions shown · non-contrast
Comparison: Radiograph October 05, 2018.

CLINICAL DATA: Chest tube, pneumothorax.

EXAM:
PORTABLE CHEST 1 VIEW

[chest ap]
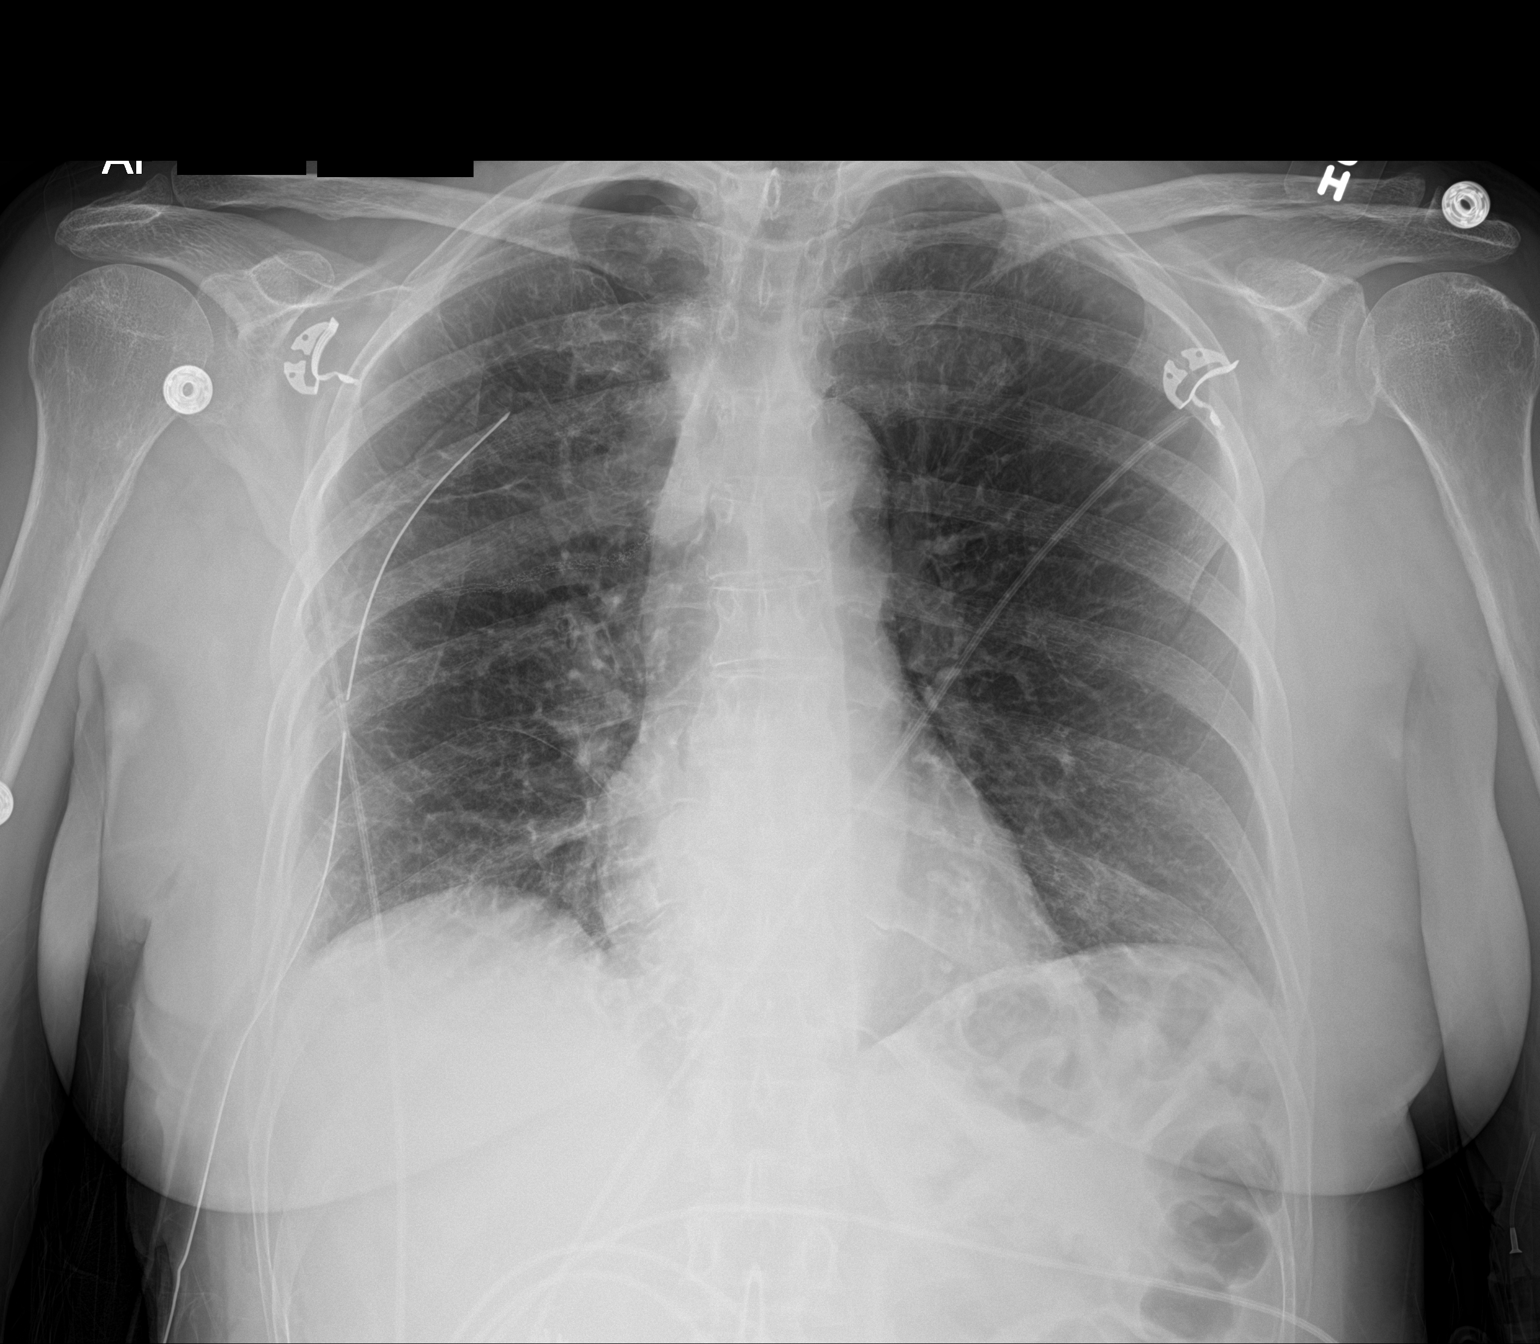

[1 of 1 positions shown; findings below may reference images not displayed]

FINDINGS: The heart size and mediastinal contours are within normal limits.
Right-sided chest tube is unchanged in position. Minimal right
apical pneumothorax is noted which is slightly enlarged compared to
prior exam. Right internal jugular catheter has been removed.
Minimal bibasilar subsegmental atelectasis is noted. The visualized
skeletal structures are unremarkable.
IMPRESSION: Stable position of right-sided chest tube. Minimal right apical
pneumothorax is noted which is slightly enlarged compared to prior
exam. Minimal bibasilar subsegmental atelectasis.

## 2020-08-12 DIAGNOSIS — M9903 Segmental and somatic dysfunction of lumbar region: Secondary | ICD-10-CM | POA: Diagnosis not present

## 2020-08-12 DIAGNOSIS — M530 Cervicocranial syndrome: Secondary | ICD-10-CM | POA: Diagnosis not present

## 2020-08-12 DIAGNOSIS — M5441 Lumbago with sciatica, right side: Secondary | ICD-10-CM | POA: Diagnosis not present

## 2020-08-12 DIAGNOSIS — M9901 Segmental and somatic dysfunction of cervical region: Secondary | ICD-10-CM | POA: Diagnosis not present

## 2020-08-12 IMAGING — CR DG CHEST 2V
2 series · 2 of 2 positions shown · non-contrast
Comparison: 10/06/2018.

CLINICAL DATA: Follow-up pneumothorax.

EXAM:
CHEST - 2 VIEW

[chest lat]
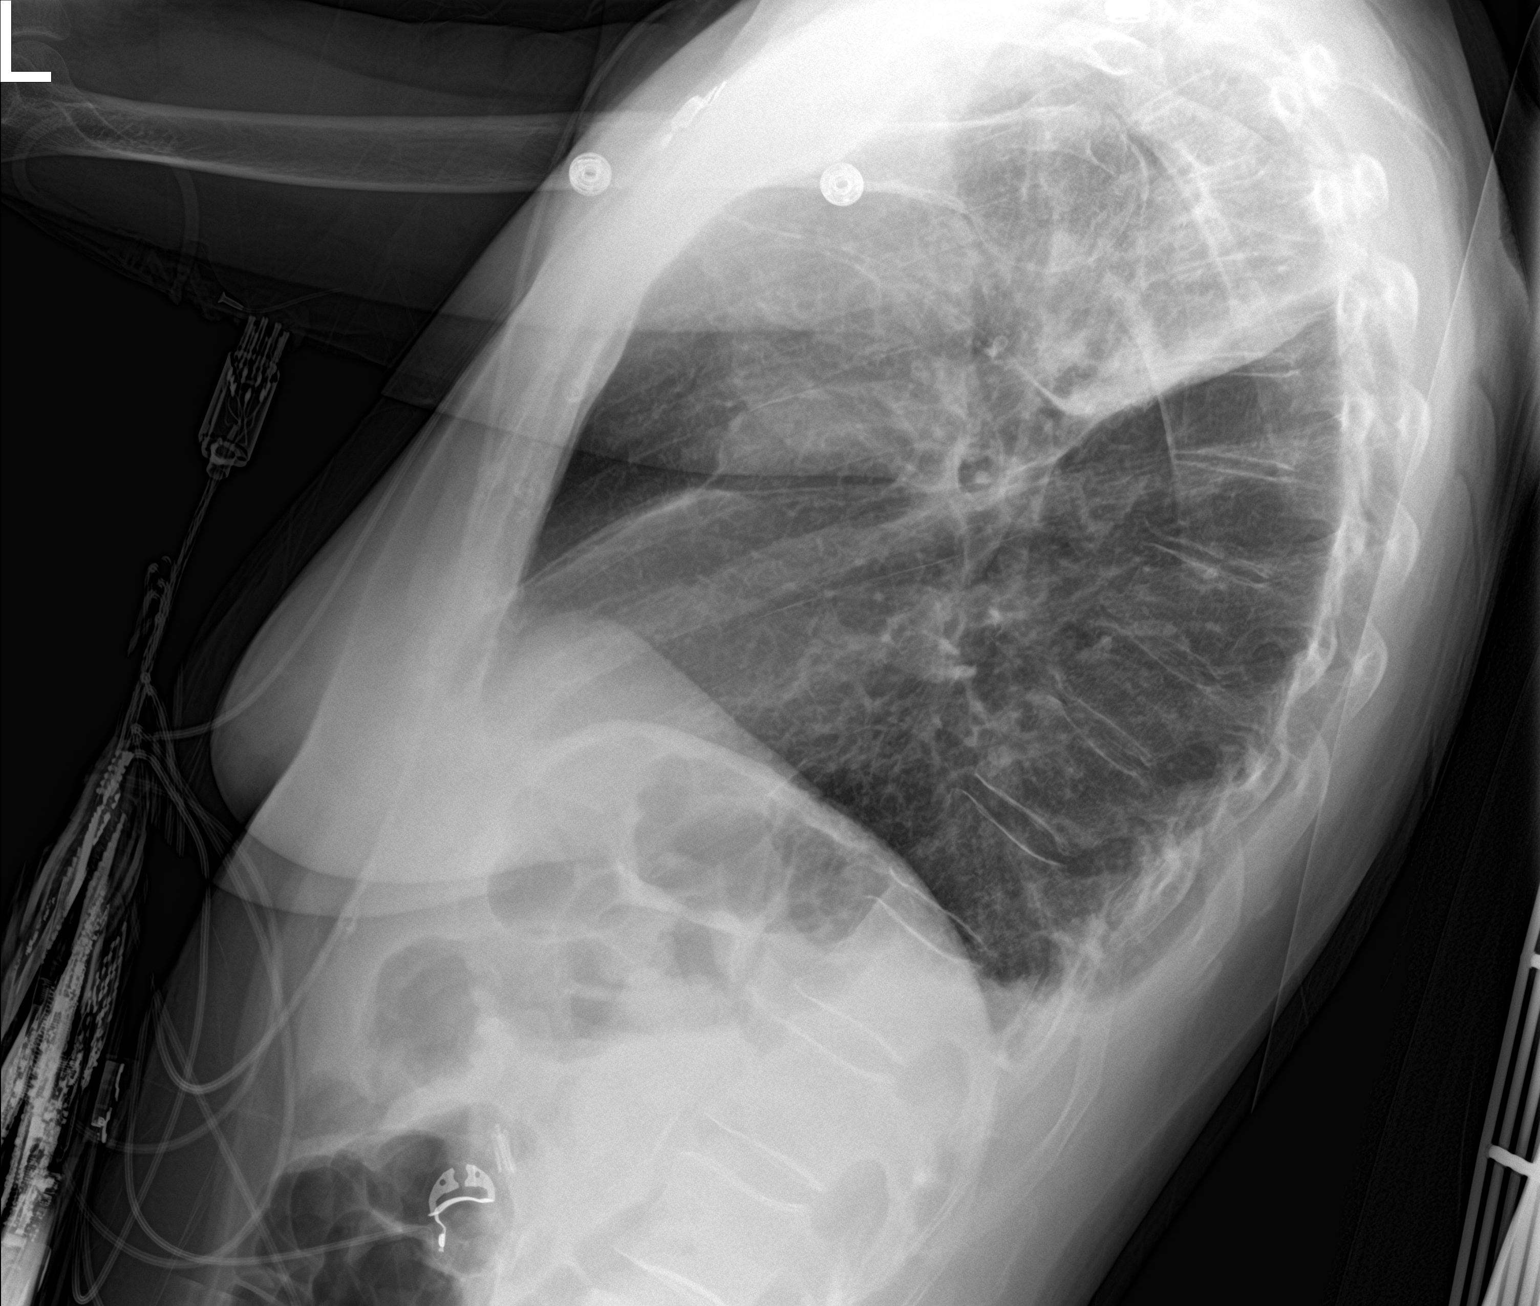

[chest ap]
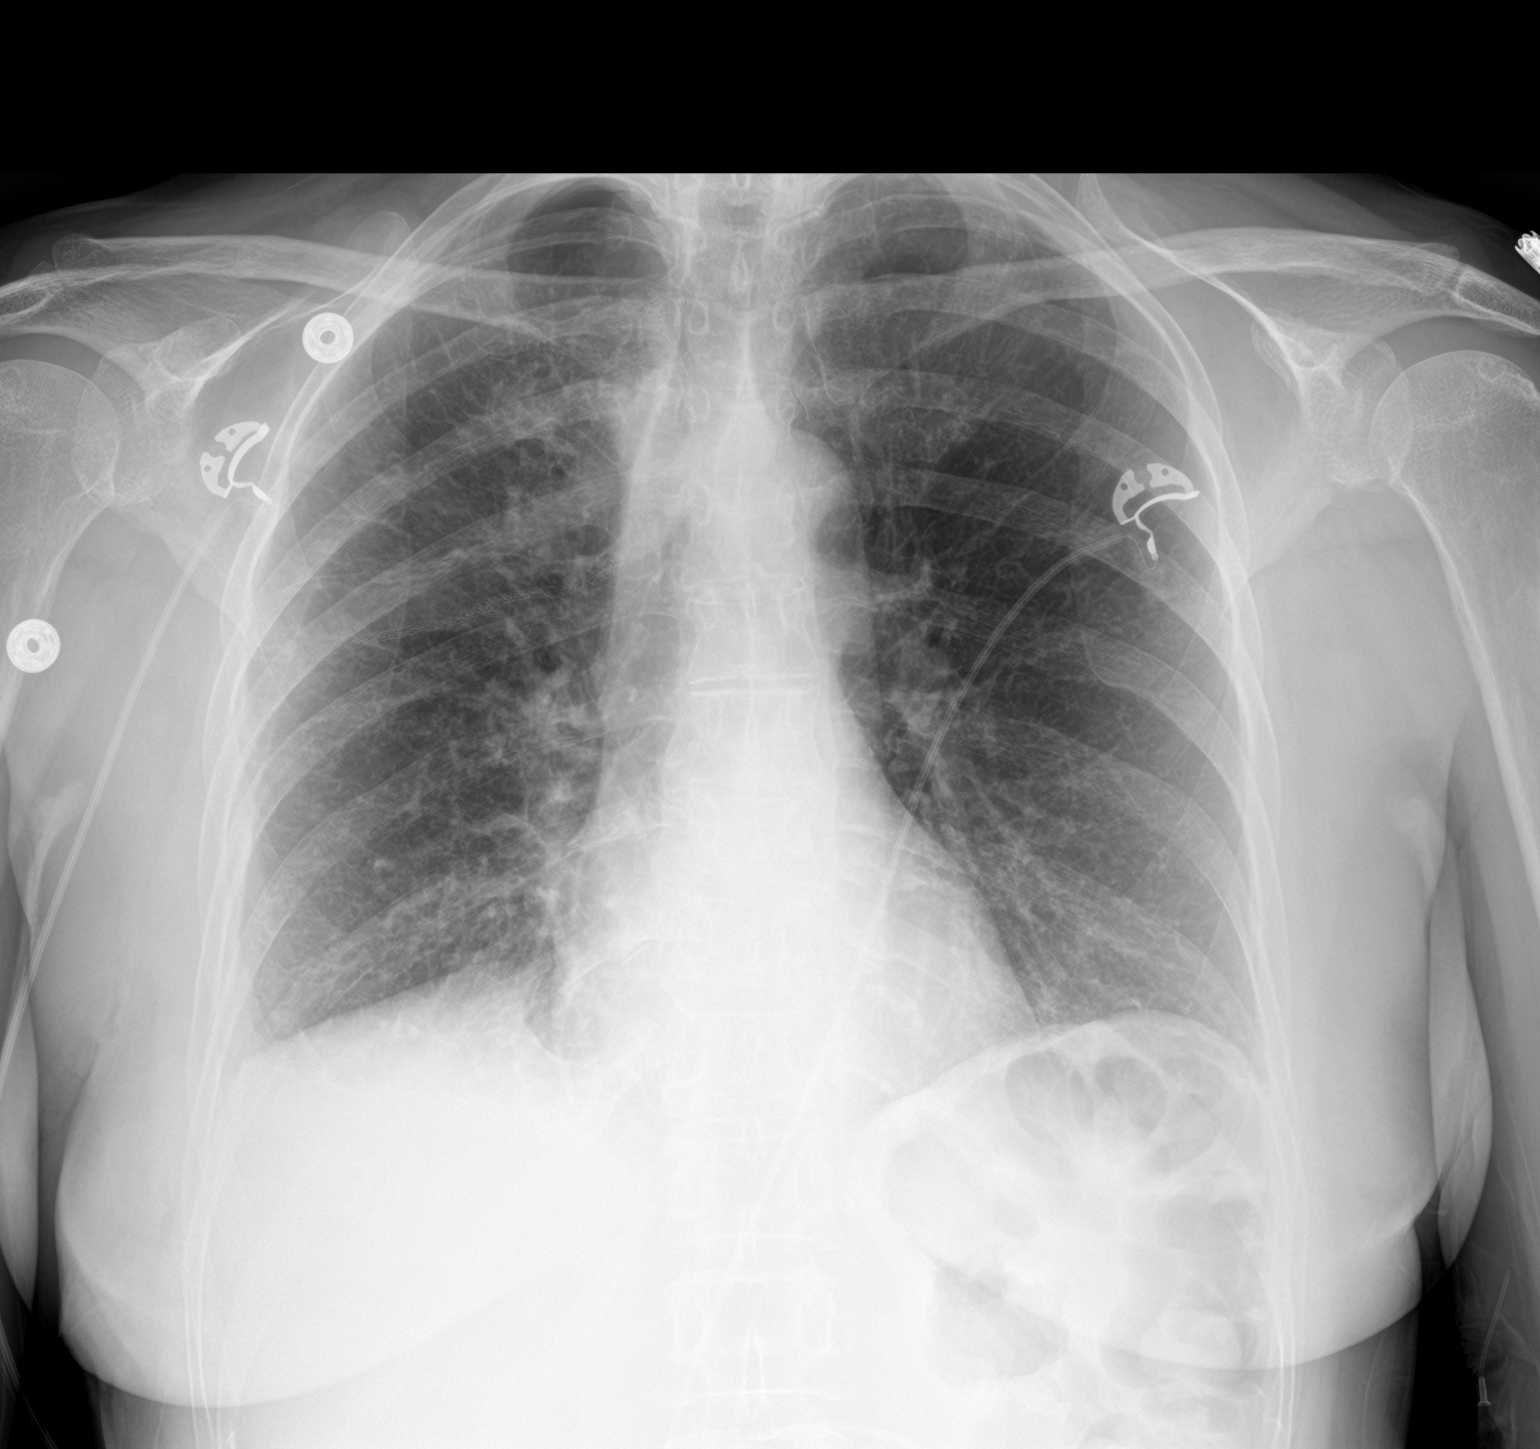

[2 of 2 positions shown; findings below may reference images not displayed]

FINDINGS: Small right apical pneumothorax only decreased in size from the
previous exam. Pleural line is obscured the superior cortical margin
of the posterior right third rib.

No acute findings lungs.
IMPRESSION: 1. Mildly decreased in size from the previous day's exam. No other
change.
2. Essentially clear.

## 2020-08-13 DIAGNOSIS — M9901 Segmental and somatic dysfunction of cervical region: Secondary | ICD-10-CM | POA: Diagnosis not present

## 2020-08-13 DIAGNOSIS — M530 Cervicocranial syndrome: Secondary | ICD-10-CM | POA: Diagnosis not present

## 2020-08-13 DIAGNOSIS — M9903 Segmental and somatic dysfunction of lumbar region: Secondary | ICD-10-CM | POA: Diagnosis not present

## 2020-08-13 DIAGNOSIS — M5441 Lumbago with sciatica, right side: Secondary | ICD-10-CM | POA: Diagnosis not present

## 2020-08-15 DIAGNOSIS — M530 Cervicocranial syndrome: Secondary | ICD-10-CM | POA: Diagnosis not present

## 2020-08-15 DIAGNOSIS — M5441 Lumbago with sciatica, right side: Secondary | ICD-10-CM | POA: Diagnosis not present

## 2020-08-15 DIAGNOSIS — M9901 Segmental and somatic dysfunction of cervical region: Secondary | ICD-10-CM | POA: Diagnosis not present

## 2020-08-15 DIAGNOSIS — M9903 Segmental and somatic dysfunction of lumbar region: Secondary | ICD-10-CM | POA: Diagnosis not present

## 2020-08-19 DIAGNOSIS — M530 Cervicocranial syndrome: Secondary | ICD-10-CM | POA: Diagnosis not present

## 2020-08-19 DIAGNOSIS — M9903 Segmental and somatic dysfunction of lumbar region: Secondary | ICD-10-CM | POA: Diagnosis not present

## 2020-08-19 DIAGNOSIS — M5441 Lumbago with sciatica, right side: Secondary | ICD-10-CM | POA: Diagnosis not present

## 2020-08-19 DIAGNOSIS — M9901 Segmental and somatic dysfunction of cervical region: Secondary | ICD-10-CM | POA: Diagnosis not present

## 2020-08-28 DIAGNOSIS — I959 Hypotension, unspecified: Secondary | ICD-10-CM | POA: Diagnosis not present

## 2020-08-28 DIAGNOSIS — E782 Mixed hyperlipidemia: Secondary | ICD-10-CM | POA: Diagnosis not present

## 2020-08-28 DIAGNOSIS — J300 Vasomotor rhinitis: Secondary | ICD-10-CM | POA: Diagnosis not present

## 2020-08-28 DIAGNOSIS — R079 Chest pain, unspecified: Secondary | ICD-10-CM | POA: Diagnosis not present

## 2020-09-26 DIAGNOSIS — H5213 Myopia, bilateral: Secondary | ICD-10-CM | POA: Diagnosis not present

## 2021-01-27 DIAGNOSIS — G43809 Other migraine, not intractable, without status migrainosus: Secondary | ICD-10-CM | POA: Insufficient documentation

## 2021-04-17 DIAGNOSIS — G43809 Other migraine, not intractable, without status migrainosus: Secondary | ICD-10-CM | POA: Diagnosis not present

## 2021-04-17 DIAGNOSIS — J300 Vasomotor rhinitis: Secondary | ICD-10-CM | POA: Diagnosis not present

## 2021-04-23 DIAGNOSIS — G43809 Other migraine, not intractable, without status migrainosus: Secondary | ICD-10-CM | POA: Diagnosis not present

## 2021-06-04 DIAGNOSIS — R42 Dizziness and giddiness: Secondary | ICD-10-CM | POA: Diagnosis not present

## 2021-06-04 DIAGNOSIS — E559 Vitamin D deficiency, unspecified: Secondary | ICD-10-CM | POA: Diagnosis not present

## 2021-06-04 DIAGNOSIS — G43909 Migraine, unspecified, not intractable, without status migrainosus: Secondary | ICD-10-CM | POA: Diagnosis not present

## 2021-06-04 DIAGNOSIS — J3489 Other specified disorders of nose and nasal sinuses: Secondary | ICD-10-CM | POA: Diagnosis not present

## 2021-06-04 DIAGNOSIS — E782 Mixed hyperlipidemia: Secondary | ICD-10-CM | POA: Diagnosis not present

## 2021-06-13 ENCOUNTER — Inpatient Hospital Stay: Payer: Medicare Other | Attending: Internal Medicine

## 2021-06-13 ENCOUNTER — Other Ambulatory Visit: Payer: Self-pay

## 2021-06-13 ENCOUNTER — Ambulatory Visit (HOSPITAL_COMMUNITY)
Admission: RE | Admit: 2021-06-13 | Discharge: 2021-06-13 | Disposition: A | Discharge status: Home / Self Care | Payer: Medicare Other | Source: Ambulatory Visit | Attending: Internal Medicine | Admitting: Internal Medicine

## 2021-06-13 DIAGNOSIS — C3490 Malignant neoplasm of unspecified part of unspecified bronchus or lung: Secondary | ICD-10-CM | POA: Diagnosis not present

## 2021-06-13 DIAGNOSIS — R918 Other nonspecific abnormal finding of lung field: Secondary | ICD-10-CM | POA: Diagnosis not present

## 2021-06-13 DIAGNOSIS — R519 Headache, unspecified: Secondary | ICD-10-CM | POA: Insufficient documentation

## 2021-06-13 DIAGNOSIS — Z85110 Personal history of malignant carcinoid tumor of bronchus and lung: Secondary | ICD-10-CM | POA: Insufficient documentation

## 2021-06-13 LAB — CBC WITH DIFFERENTIAL (CANCER CENTER ONLY)
Abs Immature Granulocytes: 0.04 10*3/uL (ref 0.00–0.07)
Basophils Absolute: 0.1 10*3/uL (ref 0.0–0.1)
Basophils Relative: 2 %
Eosinophils Absolute: 0.3 10*3/uL (ref 0.0–0.5)
Eosinophils Relative: 4 %
HCT: 37.5 % (ref 36.0–46.0)
Hemoglobin: 12.7 g/dL (ref 12.0–15.0)
Immature Granulocytes: 1 %
Lymphocytes Relative: 39 %
Lymphs Abs: 2.6 10*3/uL (ref 0.7–4.0)
MCH: 31.4 pg (ref 26.0–34.0)
MCHC: 33.9 g/dL (ref 30.0–36.0)
MCV: 92.6 fL (ref 80.0–100.0)
Monocytes Absolute: 0.6 10*3/uL (ref 0.1–1.0)
Monocytes Relative: 8 %
Neutro Abs: 3.2 10*3/uL (ref 1.7–7.7)
Neutrophils Relative %: 46 %
Platelet Count: 354 10*3/uL (ref 150–400)
RBC: 4.05 MIL/uL (ref 3.87–5.11)
RDW: 12.5 % (ref 11.5–15.5)
WBC Count: 6.8 10*3/uL (ref 4.0–10.5)
nRBC: 0 % (ref 0.0–0.2)

## 2021-06-13 LAB — CMP (CANCER CENTER ONLY)
ALT: 13 U/L (ref 0–44)
AST: 18 U/L (ref 15–41)
Albumin: 4.3 g/dL (ref 3.5–5.0)
Alkaline Phosphatase: 63 U/L (ref 38–126)
Anion gap: 7 (ref 5–15)
BUN: 14 mg/dL (ref 8–23)
CO2: 28 mmol/L (ref 22–32)
Calcium: 9.6 mg/dL (ref 8.9–10.3)
Chloride: 106 mmol/L (ref 98–111)
Creatinine: 0.97 mg/dL (ref 0.44–1.00)
GFR, Estimated: >60 mL/min (ref >60)
Glucose, Bld: 104 mg/dL — ABNORMAL HIGH (ref 70–99)
Potassium: 4 mmol/L (ref 3.5–5.1)
Sodium: 141 mmol/L (ref 135–145)
Total Bilirubin: 0.3 mg/dL (ref 0.3–1.2)
Total Protein: 7.1 g/dL (ref 6.5–8.1)

## 2021-06-13 MED ORDER — SODIUM CHLORIDE (PF) 0.9 % IJ SOLN
INTRAMUSCULAR | Status: AC
  Filled 2021-06-13: qty 50

## 2021-06-13 MED ORDER — IOHEXOL 300 MG/ML  SOLN
75.0000 mL | Freq: Once | INTRAMUSCULAR | Status: AC | PRN
  Administered 2021-06-13: 75 mL via INTRAVENOUS

## 2021-06-16 DIAGNOSIS — Z1231 Encounter for screening mammogram for malignant neoplasm of breast: Secondary | ICD-10-CM | POA: Diagnosis not present

## 2021-06-18 ENCOUNTER — Inpatient Hospital Stay: Payer: Medicare Other | Admitting: Internal Medicine

## 2021-06-18 ENCOUNTER — Other Ambulatory Visit: Payer: Self-pay

## 2021-06-18 VITALS — BP 125/81 | HR 89 | Temp 98.0°F | Resp 15 | Ht 64.0 in | Wt 141.6 lb

## 2021-06-18 DIAGNOSIS — C7A090 Malignant carcinoid tumor of the bronchus and lung: Secondary | ICD-10-CM

## 2021-06-18 DIAGNOSIS — Z85110 Personal history of malignant carcinoid tumor of bronchus and lung: Secondary | ICD-10-CM | POA: Diagnosis not present

## 2021-06-18 DIAGNOSIS — R519 Headache, unspecified: Secondary | ICD-10-CM | POA: Diagnosis not present

## 2021-06-18 NOTE — Progress Notes (Signed)
[facility] [phone]   [phone]  OFFICE PROGRESS NOTE  London Pepper, MD [place] [address]  DIAGNOSIS: stage IA (T1b, N0, M0) typical carcinoid tumor. The patient has multiple other pulmonary nodules that need close monitoring and observation.  PRIOR THERAPY: status post wedge resection of right upper lobectomy with lymph node sampling under the care of Dr. Roxan Hockey on 10/03/2018.  CURRENT THERAPY: Observation.  INTERVAL HISTORY: Shelly [name] 76 y.o. female returns to the clinic today for follow-up visit accompanied by her husband.  The patient is feeling fine today with no concerning complaints except for migraine headache and she is followed by neurology for evaluation.  She denied having any current chest pain, shortness of breath, cough or hemoptysis.  She has no nausea, vomiting, diarrhea or constipation.  She has no recent weight loss or night sweats.  She is here today for evaluation and repeat CT scan of the chest for restaging of her disease.   MEDICAL HISTORY: Past Medical History:  Diagnosis Date   Allergic rhinitis    Anxiety    Arthritis    right hand   High cholesterol    Hypotension    Transient gluten sensitivity    diarrhea and stomaCH pains   Vasomotor rhinitis     ALLERGIES:  is allergic to alpha-gal, atorvastatin, cephalosporins, gelatin, meat extract, alendronate sodium, doxycycline, gluten, gluten meal, hyoscyamine sulfate, clindamycin, cortisone, paxil [paroxetine], penicillins, prednisone, and sulfonamide derivatives.  MEDICATIONS:  Current Outpatient Medications  Medication Sig Dispense Refill   acetaminophen (TYLENOL) 500 MG tablet Take 1,000 mg by mouth every 6 (six) hours as needed (headaches.).      calcium carbonate (OSCAL) 1500 (600 Ca) MG TABS tablet 1 tablet with meals     EPINEPHrine 0.3 mg/0.3 mL IJ SOAJ injection Inject into the muscle as directed.      ezetimibe (ZETIA) 10 MG tablet Take 10 mg by mouth daily.     ibuprofen (ADVIL,MOTRIN) 200 MG tablet Take 200 mg by mouth every 6 (six) hours as needed for mild pain (muscle pain.).      meclizine (ANTIVERT) 12.5 MG tablet Take 12.5-25 mg by mouth daily as needed.     Pediatric Multivitamins-Iron (CHILD CHEWABLE VITAMINS/IRON) chewable tablet Chew 2 tablets by mouth every other day.     VITAMIN B COMPLEX-C PO Take 1 tablet by mouth daily.     No current facility-administered medications for this visit.    SURGICAL HISTORY:  Past Surgical History:  Procedure Laterality Date   EUS N/A 06/28/2013   Procedure: ESOPHAGEAL ENDOSCOPIC ULTRASOUND (EUS) RADIAL;  Surgeon: Arta Silence, MD;  Location: [facility];  Service: Endoscopy;  Laterality: N/A;   GALLBLADDER SURGERY  2005   GANGLION CYST EXCISION Left 2003   TUBAL LIGATION  1981   VIDEO ASSISTED THORACOSCOPY (VATS)/WEDGE RESECTION Right 10/03/2018   Procedure: VIDEO ASSISTED THORACOSCOPY (VATS)/WEDGE RESECTION with multiple node dissection.;  Surgeon: Melrose Nakayama, MD;  Location: [facility] OR;  Service: Thoracic;  Laterality: Right;    REVIEW OF SYSTEMS:  A comprehensive review of systems was negative except for: Neurological: positive for dizziness and headaches   PHYSICAL EXAMINATION: General appearance: alert, cooperative, and no distress Head: Normocephalic, without obvious abnormality, atraumatic Neck: no adenopathy, no JVD, supple, symmetrical, trachea midline, and thyroid not enlarged, symmetric, no tenderness/mass/nodules Lymph nodes: Cervical, supraclavicular, and axillary nodes normal. Resp: clear to auscultation bilaterally Back: symmetric, no curvature. ROM normal.  No CVA tenderness. Cardio: regular rate and rhythm, S1, S2 normal, no murmur, click, rub or gallop GI: soft, non-tender; bowel sounds normal; no masses,  no organomegaly Extremities: extremities normal, atraumatic, no cyanosis or edema  ECOG PERFORMANCE STATUS:  1 - Symptomatic but completely ambulatory  Blood pressure 125/81, pulse 89, temperature 98 F (36.7 C), temperature source Oral, resp. rate 15, height 5\' 4"  (1.626 m), weight 141 lb 9.6 oz (64.2 kg), SpO2 98 %.  LABORATORY DATA: Lab Results  Component Value Date   WBC 6.8 06/13/2021   HGB 12.7 06/13/2021   HCT 37.5 06/13/2021   MCV 92.6 06/13/2021   PLT 354 06/13/2021      Chemistry      Component Value Date/Time   NA 141 06/13/2021 1112   K 4.0 06/13/2021 1112   CL 106 06/13/2021 1112   CO2 28 06/13/2021 1112   BUN 14 06/13/2021 1112   CREATININE 0.97 06/13/2021 1112      Component Value Date/Time   CALCIUM 9.6 06/13/2021 1112   ALKPHOS 63 06/13/2021 1112   AST 18 06/13/2021 1112   ALT 13 06/13/2021 1112   BILITOT 0.3 06/13/2021 1112       RADIOGRAPHIC STUDIES: CT Chest W Contrast  Result Date: 06/16/2021 CLINICAL DATA:  Non-small cell lung cancer, carcinoid, staging status post right upper lobe wedge resection * Tracking Code: BO * EXAM: CT CHEST WITH CONTRAST TECHNIQUE: Multidetector CT imaging of the chest was performed during intravenous contrast administration. RADIATION DOSE REDUCTION: This exam was performed according to the departmental dose-optimization program which includes automated exposure control, adjustment of the mA and/or kV according to patient size and/or use of iterative reconstruction technique. CONTRAST:  26mL OMNIPAQUE IOHEXOL 300 MG/ML  SOLN COMPARISON:  06/17/2020 FINDINGS: Cardiovascular: Aortic atherosclerosis. Normal heart size. No pericardial effusion. Mediastinum/Nodes: No enlarged mediastinal, hilar, or axillary lymph nodes. Thyroid gland, trachea, and esophagus demonstrate no significant findings. Lungs/Pleura: Redemonstrated postoperative findings of posterior paramedian right upper lobe wedge resection. Multiple small pulmonary nodules are unchanged, for example a 0.5 cm nodule of the central right middle lobe (series 5, image 75), a 0.3 cm  nodule of the anterior right lower lobe (series 5, image 82), a 0.7 cm nodule of the right lung base (series 5, image 103) and irregular, clustered centrilobular nodules in the peripheral left upper lobe, largest measuring 0.7 cm (series 5, image 53). No pleural effusion or pneumothorax. Upper Abdomen: No acute abnormality.  Status post cholecystectomy. Musculoskeletal: No chest wall abnormality. No suspicious osseous lesions identified. IMPRESSION: 1. Redemonstrated postoperative findings of posterior paramedian right upper lobe wedge resection. 2. Multiple small pulmonary nodules measuring 0.7 cm and smaller are unchanged and nonspecific. Attention on follow-up. Aortic Atherosclerosis (ICD10-I70.0). Electronically Signed   By: Delanna Ahmadi M.D.   On: 06/16/2021 10:15     ASSESSMENT AND PLAN: This is a very pleasant 76 years old white female with a stage Ia carcinoid tumor of the lung status post wedge resection of the right upper lobe with lymph node sampling in September 2020 under the care of Dr. Roxan Hockey. The patient is currently on observation and she is feeling fine with no concerning complaints except for the persistent headache and she is followed by neurology. She had MRI of the brain performed several weeks ago and this was unremarkable for any metastatic disease to the brain. The patient had repeat CT scan of the chest performed recently.  I personally and independently reviewed the scans and discussed the result  with the patient and her husband. Her scan showed no concerning findings for disease progression and she continues to have the multiple small pulmonary nodules that are unchanged. I recommended for her to continue on observation with repeat CT scan of the chest in 1 year. The patient was advised to call immediately if she has any other concerning symptoms in the interval. The patient voices understanding of current disease status and treatment options and is in agreement with the  current care plan.  All questions were answered. The patient knows to call the clinic with any problems, questions or concerns. We can certainly see the patient much sooner if necessary.  Disclaimer: This note was dictated with voice recognition software. Similar sounding words can inadvertently be transcribed and may not be corrected upon review.

## 2021-07-02 DIAGNOSIS — K5730 Diverticulosis of large intestine without perforation or abscess without bleeding: Secondary | ICD-10-CM | POA: Diagnosis not present

## 2021-07-02 DIAGNOSIS — Z09 Encounter for follow-up examination after completed treatment for conditions other than malignant neoplasm: Secondary | ICD-10-CM | POA: Diagnosis not present

## 2021-07-02 DIAGNOSIS — Z86010 Personal history of colonic polyps: Secondary | ICD-10-CM | POA: Diagnosis not present

## 2021-07-02 DIAGNOSIS — K648 Other hemorrhoids: Secondary | ICD-10-CM | POA: Diagnosis not present

## 2021-07-02 DIAGNOSIS — K635 Polyp of colon: Secondary | ICD-10-CM | POA: Diagnosis not present

## 2021-07-04 DIAGNOSIS — K635 Polyp of colon: Secondary | ICD-10-CM | POA: Diagnosis not present

## 2021-07-04 DIAGNOSIS — H524 Presbyopia: Secondary | ICD-10-CM | POA: Diagnosis not present

## 2021-07-11 ENCOUNTER — Encounter: Payer: Self-pay | Admitting: Neurology

## 2021-07-28 ENCOUNTER — Telehealth: Payer: Self-pay | Admitting: Physical Medicine and Rehabilitation

## 2021-07-28 NOTE — Telephone Encounter (Signed)
Patient called needing to schedule an appointment with Dr. Ernestina Patches for her back. The number to contact patient is [phone]

## 2021-08-08 ENCOUNTER — Ambulatory Visit: Payer: Medicare Other | Admitting: Physical Medicine and Rehabilitation

## 2021-08-08 ENCOUNTER — Encounter: Payer: Self-pay | Admitting: Physical Medicine and Rehabilitation

## 2021-08-08 VITALS — BP 114/55 | HR 90

## 2021-08-08 DIAGNOSIS — M47816 Spondylosis without myelopathy or radiculopathy, lumbar region: Secondary | ICD-10-CM | POA: Diagnosis not present

## 2021-08-08 DIAGNOSIS — M48061 Spinal stenosis, lumbar region without neurogenic claudication: Secondary | ICD-10-CM | POA: Diagnosis not present

## 2021-08-08 DIAGNOSIS — M48062 Spinal stenosis, lumbar region with neurogenic claudication: Secondary | ICD-10-CM | POA: Diagnosis not present

## 2021-08-08 DIAGNOSIS — M5416 Radiculopathy, lumbar region: Secondary | ICD-10-CM | POA: Diagnosis not present

## 2021-08-08 MED ORDER — DIAZEPAM 5 MG PO TABS: ORAL_TABLET | ORAL | 0 refills | Status: DC

## 2021-08-08 NOTE — Progress Notes (Signed)
Pt state lower back pain that travels left hip and leg. Pt state walking, standing and sitting makes the pain worse. Pt state she takes over the counter pain meds to help ease her pain.  Numeric Pain Rating Scale and Functional Assessment Average Pain 10 Pain Right Now 6 My pain is intermittent, sharp, burning, and stabbing Pain is worse with: walking, bending, sitting, standing, and some activites Pain improves with: medication and injections   In the last MONTH (on 0-10 scale) has pain interfered with the following?  1. General activity like being  able to carry out your everyday physical activities such as walking, climbing stairs, carrying groceries, or moving a chair?  Rating(5)  2. Relation with others like being able to carry out your usual social activities and roles such as  activities at home, at work and in your community. Rating(6)  3. Enjoyment of life such that you have  been bothered by emotional problems such as feeling anxious, depressed or irritable?  Rating(7)

## 2021-08-08 NOTE — Progress Notes (Signed)
Shelly [name] - 76 y.o. female MRN [id number]  Date of birth: Jul 05, 1945  Office Visit Note: Visit Date: 08/08/2021 PCP: London Pepper, MD Referred by: London Pepper, MD  Subjective: Chief Complaint  Patient presents with   Lower Back - Pain   Left Leg - Pain   Left Hip - Pain   HPI: Shelly [name] is a 76 y.o. female who comes in today for evaluation of chronic, worsening and severe left sided lower back pain radiating to buttock, hip and down lateral leg. Patient last seen in our office in 2021. Patients husband is accompanying her during our visit today. Pain ongoing for several years and is exacerbated by standing, walking and activity, describes pain as sore and aching, currently rates as 6 out of 10. Patient states she recently started swimming in community pool and noticed that pain has gradually increased. She reports some relief of pain with home exercise regimen, rest and use of medications. Patient did attend formal physical therapy many years ago at [facility], states some relief of pain with these treatments. Lumbar MRI imaging from 2021 exhibits moderate facet degeneration at L4-5 resulting in moderate spinal stenosis and left greater than right lateral recess narrowing. No high grade spinal canal stenosis noted. Patient has received multiple lumbar epidural steroid injections in our office over the years with good relief of pain. Most recent injection was left L5-S1 interlaminar epidural steroid injection in August of 2021, states this provided significant and sustained pain relief, greater than 80% until the last few months. Patient states she is a very active person and pain is negatively impacting her daily life. She does have history of carcinoid tumor in the upper right lobe of the lung.  She underwent a VATS procedure for resection in 2020, patient reports no issues with re-occurrence, she does attend regular follow ups with oncology. Patient denies focal  weakness, numbness and tingling. Patient denies recent trauma or falls.    Review of Systems  Musculoskeletal:  Positive for back pain.  Neurological:  Negative for tingling, sensory change, focal weakness and weakness.  All other systems reviewed and are negative.  Otherwise per HPI.  Assessment & Plan: Visit Diagnoses:    ICD-10-CM   1. Lumbar radiculopathy  M54.16 Ambulatory referral to Physical Medicine Rehab    2. Spinal stenosis of lumbar region with neurogenic claudication  M48.062 Ambulatory referral to Physical Medicine Rehab    3. Stenosis of lateral recess of lumbar spine  M48.061 Ambulatory referral to Physical Medicine Rehab    4. Facet arthropathy, lumbar  M47.816 Ambulatory referral to Physical Medicine Rehab       Plan: Findings:  Chronic, worsening and severe left sided lower back pain radiating to buttock, hip and down lateral leg. Patient continues to have severe pain despite good conservative therapies such as formal physical therapy, home exercise regimen, rest and use of medications. Patients clinical presentation and exam are consistent with L5 nerve pattern. Next step is to repeat left L5-S1 interlaminar epidural steroid injection under fluoroscopic guidance. Patient is not currently taking anticoagulant medication. We also discussed repeating lumbar MRI imaging as previous exam was in 2016 and she also has history of carcinoid tumor. I did place order for new lumbar MRI imaging today, I will have her follow up after imaging is obtained to review. I did place order for Valium today as patient does carry a diagnosis of anxiety and did well with pre-procedure sedation prior to last injection in  2021. Patient encouraged to remain active as tolerated.    As I stated earlier, patients husband did accompany her during this visit today. I briefly talked with him about chronic pain management, did explain to him that we do not manage chronic pain with opioid medications. He  does have list of recommended chronic pain providers from his rheumatologist, we are happy to take a look at this list and provide any input we can.     Meds & Orders:  Meds ordered this encounter  Medications   diazepam (VALIUM) 5 MG tablet    Sig: Take one tablet by mouth with food one hour prior to procedure. May repeat 30 minutes prior if needed.    Dispense:  2 tablet    Refill:  0    Orders Placed This Encounter  Procedures   Ambulatory referral to Physical Medicine Rehab    Follow-up: Return for Left L5-S1 interlaminar epidural steroid injection.   Procedures: No procedures performed      Clinical History: EXAM: MRI LUMBAR SPINE WITHOUT CONTRAST   TECHNIQUE: Multiplanar, multisequence MR imaging of the lumbar spine was performed. No intravenous contrast was administered.   COMPARISON:  CTA abdomen and pelvis 08/03/2007   FINDINGS: Normal lumbar segmentation on comparison CTA. Vertebral alignment is within normal limits. Vertebral body heights are preserved. There is diffuse lumbar disc desiccation. Intervertebral disc space heights are preserved. L4 vertebral body hemangioma is noted. Vertebral bone marrow signal is mildly heterogeneous diffusely. Mild marrow edema is associated with bilateral facet arthritis at L4-5. Conus medullaris is normal in signal and terminates at L1. 3.1 cm right renal cyst has increased in size from the prior study.   L1-2:  Small central disc protrusion without stenosis.   L2-3:  Mild disc bulging without stenosis.   L3-4:  Mild disc bulging without stenosis.   L4-5: Mild disc bulging and moderate facet hypertrophy result in moderate spinal stenosis, mild right and moderate left lateral recess stenosis, and no significant neural foraminal stenosis. There is the potential for left-sided nerve root impingement in the left lateral recess, particularly of the L5 nerve root.   L5-S1: Mild disc bulging and moderate facet arthrosis  without stenosis. Small right facet joint effusion.   IMPRESSION: 1. Mild disc and moderate facet degeneration at L4-5 resulting in moderate spinal stenosis and left greater than right lateral recess stenosis. 2. Mild disc and facet degeneration elsewhere without stenosis.     Electronically Signed   By: Logan Bores   On: 08/18/2014 10:54   She reports that she quit smoking about 2 years ago. Her smoking use included cigarettes. She has a 14.25 pack-year smoking history. She has never used smokeless tobacco. No results for input(s): "HGBA1C", "LABURIC" in the last 8760 hours.  Objective:  VS:  HT:    WT:   BMI:     BP:(!) 114/55  HR:90bpm  TEMP: ( )  RESP:  Physical Exam Vitals and nursing note reviewed.  HENT:     Head: Normocephalic and atraumatic.     Right Ear: External ear normal.     Left Ear: External ear normal.     Nose: Nose normal.     Mouth/Throat:     Mouth: Mucous membranes are moist.  Eyes:     Extraocular Movements: Extraocular movements intact.  Cardiovascular:     Rate and Rhythm: Normal rate.     Pulses: Normal pulses.  Pulmonary:     Effort: Pulmonary effort is normal.  Abdominal:     General: Abdomen is flat. There is no distension.  Musculoskeletal:        General: Tenderness present.     Cervical back: Normal range of motion.     Comments: Pt rises from seated position to standing without difficulty. Good lumbar range of motion. Strong distal strength without clonus, no pain upon palpation of greater trochanters. Dysesthesias noted to left L5 nerve pattern. Sensation intact bilaterally. Walks independently, gait steady.   Skin:    General: Skin is warm and dry.     Capillary Refill: Capillary refill takes less than 2 seconds.  Neurological:     General: No focal deficit present.     Mental Status: She is alert and oriented to person, place, and time.  Psychiatric:        Mood and Affect: Mood normal.        Behavior: Behavior normal.      Ortho Exam  Imaging: No results found.  Past Medical/Family/Surgical/Social History: Medications & Allergies reviewed per EMR, new medications updated. Patient Active Problem List   Diagnosis Date Noted   Vision disturbance 05/28/2020   Flushing 05/28/2020   Mallet finger of left hand 04/23/2020   Trigger finger, right ring finger 04/23/2020   Malignant carcinoid tumor of the bronchus and lung ([place]) 05/15/2019   Nodule of upper lobe of right lung 10/03/2018   Cigarette smoker 09/05/2018   Pulmonary nodules 08/31/2011   Hemoptysis, unspecified 08/31/2011   Anxiety state 02/13/2010   DEPRESSION 02/13/2010   DIARRHEA 02/13/2010   ABDOMINAL PAIN-PERIUMBILICAL 45/62/5638   Past Medical History:  Diagnosis Date   Allergic rhinitis    Anxiety    Arthritis    right hand   High cholesterol    Hypotension    Transient gluten sensitivity    diarrhea and stomaCH pains   Vasomotor rhinitis    Family History  Problem Relation Age of Onset   Prostate cancer Father    Emphysema Father    Other Sister        Corticobasal syndrome   ALS Maternal Uncle    Past Surgical History:  Procedure Laterality Date   EUS N/A 06/28/2013   Procedure: ESOPHAGEAL ENDOSCOPIC ULTRASOUND (EUS) RADIAL;  Surgeon: Arta Silence, MD;  Location: [facility];  Service: Endoscopy;  Laterality: N/A;   GALLBLADDER SURGERY  2005   GANGLION CYST EXCISION Left 2003   TUBAL LIGATION  1981   VIDEO ASSISTED THORACOSCOPY (VATS)/WEDGE RESECTION Right 10/03/2018   Procedure: VIDEO ASSISTED THORACOSCOPY (VATS)/WEDGE RESECTION with multiple node dissection.;  Surgeon: Melrose Nakayama, MD;  Location: [facility] OR;  Service: Thoracic;  Laterality: Right;   Social History   Occupational History   Occupation: Retired  Tobacco Use   Smoking status: Former    Packs/day: 0.25    Years: 57.00    Total pack years: 14.25    Types: Cigarettes    Quit date: 09/29/2018    Years since quitting: 2.8   Smokeless tobacco:  Never  Vaping Use   Vaping Use: Never used  Substance and Sexual Activity   Alcohol use: No   Drug use: No   Sexual activity: Not on file

## 2021-08-14 ENCOUNTER — Ambulatory Visit: Payer: Self-pay

## 2021-08-14 ENCOUNTER — Encounter: Payer: Self-pay | Admitting: Physical Medicine and Rehabilitation

## 2021-08-14 ENCOUNTER — Ambulatory Visit: Payer: Medicare Other | Admitting: Physical Medicine and Rehabilitation

## 2021-08-14 VITALS — BP 100/65 | HR 96

## 2021-08-14 DIAGNOSIS — M5416 Radiculopathy, lumbar region: Secondary | ICD-10-CM | POA: Diagnosis not present

## 2021-08-14 MED ORDER — METHYLPREDNISOLONE ACETATE 80 MG/ML IJ SUSP
80.0000 mg | Freq: Once | INTRAMUSCULAR | Status: AC
  Administered 2021-08-14: 80 mg

## 2021-08-14 NOTE — Progress Notes (Signed)
Pt state lower back pain that travels left hip and leg. Pt state walking, standing and sitting makes the pain worse. Pt state she takes over the counter pain meds to help ease her pain.  Numeric Pain Rating Scale and Functional Assessment Average Pain 6   In the last MONTH (on 0-10 scale) has pain interfered with the following?  1. General activity like being  able to carry out your everyday physical activities such as walking, climbing stairs, carrying groceries, or moving a chair?  Rating(9)   +Driver, -BT, -Dye Allergies.

## 2021-08-14 NOTE — Patient Instructions (Signed)

## 2021-08-19 ENCOUNTER — Encounter: Payer: Self-pay | Admitting: Neurology

## 2021-08-19 ENCOUNTER — Ambulatory Visit (INDEPENDENT_AMBULATORY_CARE_PROVIDER_SITE_OTHER): Payer: Medicare Other | Admitting: Neurology

## 2021-08-19 VITALS — BP 134/77 | HR 98 | Ht 64.0 in | Wt 140.2 lb

## 2021-08-19 DIAGNOSIS — G43109 Migraine with aura, not intractable, without status migrainosus: Secondary | ICD-10-CM

## 2021-08-19 MED ORDER — ONDANSETRON 4 MG PO TBDP: 4.0000 mg | ORAL_TABLET | Freq: Three times a day (TID) | ORAL | 5 refills | Status: DC | PRN

## 2021-08-19 MED ORDER — UBRELVY 100 MG PO TABS: ORAL_TABLET | ORAL | 0 refills | Status: DC

## 2021-08-19 NOTE — Procedures (Signed)
Lumbar Epidural Steroid Injection - Interlaminar Approach with Fluoroscopic Guidance  Patient: Shelly [name]      Date of Birth: June 19, 1945 MRN: [id number] PCP: London Pepper, MD      Visit Date: 08/14/2021   Universal Protocol:     Consent Given By: the patient  Position: PRONE  Additional Comments: Vital signs were monitored before and after the procedure. Patient was prepped and draped in the usual sterile fashion. The correct patient, procedure, and site was verified.   Injection Procedure Details:   Procedure diagnoses: Lumbar radiculopathy [M54.16]   Meds Administered:  Meds ordered this encounter  Medications   methylPREDNISolone acetate (DEPO-MEDROL) injection 80 mg     Laterality: Left  Location/Site:  L5-S1  Needle: 3.5 in., 20 ga. Tuohy  Needle Placement: Paramedian epidural  Findings:   -Comments:  There is excellent flow of contrast essentially midline to the right into the epidural space.  Patient is one of the few patients who will say that if they do not feel it when the medicine is going and that it does not seem to work although she has not had this injection done in several years.  Depending on relief would try to biased this more to the left.  Procedure Details: Using a paramedian approach from the side mentioned above, the region overlying the inferior lamina was localized under fluoroscopic visualization and the soft tissues overlying this structure were infiltrated with 4 ml. of 1% Lidocaine without Epinephrine. The Tuohy needle was inserted into the epidural space using a paramedian approach.   The epidural space was localized using loss of resistance along with counter oblique bi-planar fluoroscopic views.  After negative aspirate for air, blood, and CSF, a 2 ml. volume of Isovue-250 was injected into the epidural space and the flow of contrast was observed. Radiographs were obtained for documentation purposes.    The injectate was administered  into the level noted above.   Additional Comments:  The patient tolerated the procedure well Dressing: 2 x 2 sterile gauze and Band-Aid    Post-procedure details: Patient was observed during the procedure. Post-procedure instructions were reviewed.  Patient left the clinic in stable condition.

## 2021-08-19 NOTE — Patient Instructions (Addendum)
  Take Ubrelvy 100mg  at earliest onset of headache.  May repeat dose once in 2 hours if needed.  Maximum 2 tablets in 24 hours. For nausea, take ondansetron Limit use of pain relievers to no more than 2 days out of the week.  These medications include acetaminophen, NSAIDs (ibuprofen/Advil/Motrin, naproxen/Aleve, triptans (Imitrex/sumatriptan), Excedrin, and narcotics.  This will help reduce risk of rebound headaches. Be aware of common food triggers:  - Caffeine:  coffee, black tea, cola, Mt. Dew  - Chocolate  - Dairy:  aged cheeses (brie, blue, cheddar, gouda, [place], provolone, [place], Swiss, etc), chocolate milk, buttermilk, sour cream, limit eggs and yogurt  - Nuts, peanut butter  - Alcohol  - Cereals/grains:  FRESH breads (fresh bagels, sourdough, doughnuts), yeast productions  - Processed/canned/aged/cured meats (pre-packaged deli meats, hotdogs)  - MSG/glutamate:  soy sauce, flavor enhancer, pickled/preserved/marinated foods  - Sweeteners:  aspartame (Equal, Nutrasweet).  Sugar and Splenda are okay  - Vegetables:  legumes (lima beans, lentils, snow peas, fava beans, pinto peans, peas, garbanzo beans), sauerkraut, onions, olives, pickles  - Fruit:  avocados, bananas, citrus fruit (orange, lemon, grapefruit), mango  - Other:  Frozen meals, macaroni and cheese Routine exercise Stay adequately hydrated (aim for 64 oz water daily) Keep headache diary Maintain proper stress management Maintain proper sleep hygiene Do not skip meals Consider supplements:  magnesium citrate 400mg  daily, riboflavin 400mg  daily, coenzyme Q10 100mg  three times daily.

## 2021-08-19 NOTE — Progress Notes (Unsigned)
NEUROLOGY CONSULTATION NOTE  [name] MRN: [id number] DOB: 1945-07-23  Referring provider: London Pepper, MD Primary care provider: London Pepper, MD  Reason for consult:  migraine  Assessment/Plan:   Migraine with aura, without status migrainosus, not intractable  At this time, she would like to avoid starting a daily preventative medication. She will try vitamins/supplements:  magnesium citrate 400mg  daily, riboflavin 400mg  daily, CoQ10 100mg  three times daily Lifestyle modification Increase hydration Increase exercise Keep headache diary - monitor food intake For migraine rescue:  Will try Ubrelvy 100mg , Zofran for nausea Follow up 4 months   Subjective:  Shelly [name] is a 76 year old female with gluten allergy, who presents for migraine.  History supplemented by referring provider's note.  She started getting episodes of headache with dizziness and sinus symptoms several years ago, but became more frequent around 2020.  She was previously diagnosed with vasomotor rhinitis due to the prominent rhinorrhea.  She describes onset of runny nose with diffuse and right sided severe pressure headache with bilateral facial and teeth pain.  Sometimes visual aura of jagged flashes in the eye.  Associated with nausea, vertigo, bilateral ear pressure, brain fog, trouble focusing eyes.  Lasts 24 hours.  Initially occurring every 9 days.  Treats with Tylenol and sometimes Sudafed and meclizine.  Started increasing Tylenol intake (daily) and now occurring almost daily.  Triggers include strong smells (air fresheners, Clorox), eye strain and air blowing in face.  Relieving factors include resting in dark room.  Evaluated by neurology, ENT, allergist and ophthalmology with negative workup.  Believed to be migraines.  MRI of brain and IACs with and without contrast on 06/21/2020 personally reviewed was unremarkable.  Past NSAIDS/analgesics:  tramadol Past abortive triptans:   rizatriptan Past abortive ergotamine:  none Past muscle relaxants:  none Past anti-emetic:  Zofran Past antihypertensive medications:  none Past antidepressant medications:  none Past anticonvulsant medications:  none Past anti-CGRP:  Nurtec Past vitamins/Herbal/Supplements:  none Past antihistamines/decongestants:  Zyrtec Other past therapies:  none  Current NSAIDS/analgesics:  Tylenol Current triptans:  none Current ergotamine:  none Current anti-emetic:  none Current muscle relaxants:  none Current Antihypertensive medications:  none Current Antidepressant medications:  none Current Anticonvulsant medications:  none Current anti-CGRP:  none Current Vitamins/Herbal/Supplements:  none Current Antihistamines/Decongestants:  meclizine 12.5-25mg  Other therapy:  none Hormone/birth control:  none   Caffeine:  2 cups coffee daily Diet:  not enough water.  Ginger ale.  Does not skip meals Exercise:  not routine Depression:  no; Anxiety:  no Sleep hygiene:  good Family history of headache:  father.  Other family history:  Sister has ALS      PAST MEDICAL HISTORY: Past Medical History:  Diagnosis Date   Allergic rhinitis    Anxiety    Arthritis    right hand   High cholesterol    Hypotension    Transient gluten sensitivity    diarrhea and stomaCH pains   Vasomotor rhinitis     PAST SURGICAL HISTORY: Past Surgical History:  Procedure Laterality Date   EUS N/A 06/28/2013   Procedure: ESOPHAGEAL ENDOSCOPIC ULTRASOUND (EUS) RADIAL;  Surgeon: Arta Silence, MD;  Location: [facility];  Service: Endoscopy;  Laterality: N/A;   GALLBLADDER SURGERY  2005   GANGLION CYST EXCISION Left 2003   TUBAL LIGATION  1981   VIDEO ASSISTED THORACOSCOPY (VATS)/WEDGE RESECTION Right 10/03/2018   Procedure: VIDEO ASSISTED THORACOSCOPY (VATS)/WEDGE RESECTION with multiple node dissection.;  Surgeon: Melrose Nakayama, MD;  Location: [facility] OR;  Service: Thoracic;  Laterality: Right;     MEDICATIONS: Current Outpatient Medications on File Prior to Visit  Medication Sig Dispense Refill   acetaminophen (TYLENOL) 500 MG tablet Take 1,000 mg by mouth every 6 (six) hours as needed (headaches.).      calcium carbonate (OSCAL) 1500 (600 Ca) MG TABS tablet 1 tablet with meals     meclizine (ANTIVERT) 12.5 MG tablet Take 12.5-25 mg by mouth daily as needed.     Cholecalciferol 50 MCG (2000 UT) TABS Take by mouth.     EPINEPHrine 0.3 mg/0.3 mL IJ SOAJ injection Inject into the muscle as directed.     ibuprofen (ADVIL,MOTRIN) 200 MG tablet Take 200 mg by mouth every 6 (six) hours as needed for mild pain (muscle pain.).      Pediatric Multivitamins-Iron (CHILD CHEWABLE VITAMINS/IRON) chewable tablet Chew 2 tablets by mouth every other day.     VITAMIN B COMPLEX-C PO Take 1 tablet by mouth daily.     No current facility-administered medications on file prior to visit.    ALLERGIES: Allergies  Allergen Reactions   Alpha-Gal Anaphylaxis    Throat swelling.   Atorvastatin Anaphylaxis   Cephalosporins     Throat swollen   Gelatin Anaphylaxis   Meat Extract Anaphylaxis   Alendronate Sodium Nausea And Vomiting   Doxycycline    Gluten Diarrhea and Nausea Only   Gluten Meal Diarrhea   Hyoscyamine Sulfate Nausea Only   Clindamycin Rash   Cortisone Rash   Paxil [Paroxetine] Rash   Penicillins Rash    Did it involve swelling of the face/tongue/throat, SOB, or low BP? No Did it involve sudden or severe rash/hives, skin peeling, or any reaction on the inside of your mouth or nose? Yes Did you need to seek medical attention at a hospital or doctor's office? No When did it last happen? 5 years ago (~2015)  If all above answers are "NO", may proceed with cephalosporin use.    Prednisone Rash   Sulfonamide Derivatives Rash    FAMILY HISTORY: Family History  Problem Relation Age of Onset   Prostate cancer Father    Emphysema Father    Other Sister        Corticobasal  syndrome   ALS Maternal Uncle     Objective:  Blood pressure 134/77, pulse 98, height 5\' 4"  (1.626 m), weight 140 lb 3.2 oz (63.6 kg), SpO2 95 %. General: No acute distress.  Patient appears well-groomed.   Head:  Normocephalic/atraumatic Eyes:  fundi examined but not visualized Neck: supple, no paraspinal tenderness, full range of motion Back: No paraspinal tenderness Heart: regular rate and rhythm Lungs: Clear to auscultation bilaterally. Vascular: No carotid bruits. Neurological Exam: Mental status: alert and oriented to person, place, and time, speech fluent and not dysarthric, language intact. Cranial nerves: CN I: not tested CN II: pupils equal, round and reactive to light, visual fields intact CN III, IV, VI:  full range of motion, no nystagmus, no ptosis CN V: facial sensation intact. CN VII: upper and lower face symmetric CN VIII: hearing intact CN IX, X: gag intact, uvula midline CN XI: sternocleidomastoid and trapezius muscles intact CN XII: tongue midline Bulk & Tone: normal, no fasciculations. Motor:  muscle strength 5/5 throughout Sensation:  Pinprick, temperature and vibratory sensation intact. Deep Tendon Reflexes:  2+ throughout,  toes downgoing.   Finger to nose testing:  Without dysmetria.   Heel to shin:  Without dysmetria.   Gait:  Normal  station and stride.  Romberg negative.    Thank you for allowing me to take part in the care of this patient.  Metta Clines, DO  CC: London Pepper, MD

## 2021-08-19 NOTE — Progress Notes (Signed)
Shelly [name] - 76 y.o. female MRN [id number]  Date of birth: [birth year]  Office Visit Note: Visit Date: 08/14/2021 PCP: London Pepper, MD Referred by: London Pepper, MD  Subjective: Chief Complaint  Patient presents with   Lower Back - Pain   Left Leg - Pain   Left Hip - Pain   HPI:  Shelly [name] is a 76 y.o. female who comes in today at the request of Barnet Pall, FNP for planned Left L5-S1 Lumbar Interlaminar epidural steroid injection with fluoroscopic guidance.  The patient has failed conservative care including home exercise, medications, time and activity modification.  This injection will be diagnostic and hopefully therapeutic.  Please see requesting physician notes for further details and justification.   ROS Otherwise per HPI.  Assessment & Plan: Visit Diagnoses:    ICD-10-CM   1. Lumbar radiculopathy  M54.16 XR C-ARM NO REPORT    Epidural Steroid injection    methylPREDNISolone acetate (DEPO-MEDROL) injection 80 mg    MR LUMBAR SPINE WO CONTRAST      Plan: No additional findings.   Meds & Orders:  Meds ordered this encounter  Medications   methylPREDNISolone acetate (DEPO-MEDROL) injection 80 mg    Orders Placed This Encounter  Procedures   XR C-ARM NO REPORT   MR LUMBAR SPINE WO CONTRAST   Epidural Steroid injection    Follow-up: Return if symptoms worsen or fail to improve.   Procedures: No procedures performed  Lumbar Epidural Steroid Injection - Interlaminar Approach with Fluoroscopic Guidance  Patient: Shelly [name]      Date of Birth: [birth year] MRN: [id number] PCP: London Pepper, MD      Visit Date: 08/14/2021   Universal Protocol:     Consent Given By: the patient  Position: PRONE  Additional Comments: Vital signs were monitored before and after the procedure. Patient was prepped and draped in the usual sterile fashion. The correct patient, procedure, and site was verified.   Injection Procedure Details:   Procedure  diagnoses: Lumbar radiculopathy [M54.16]   Meds Administered:  Meds ordered this encounter  Medications   methylPREDNISolone acetate (DEPO-MEDROL) injection 80 mg     Laterality: Left  Location/Site:  L5-S1  Needle: 3.5 in., 20 ga. Tuohy  Needle Placement: Paramedian epidural  Findings:   -Comments:  There is excellent flow of contrast essentially midline to the right into the epidural space.  Patient is one of the few patients who will say that if they do not feel it when the medicine is going and that it does not seem to work although she has not had this injection done in several years.  Depending on relief would try to biased this more to the left.  Procedure Details: Using a paramedian approach from the side mentioned above, the region overlying the inferior lamina was localized under fluoroscopic visualization and the soft tissues overlying this structure were infiltrated with 4 ml. of 1% Lidocaine without Epinephrine. The Tuohy needle was inserted into the epidural space using a paramedian approach.   The epidural space was localized using loss of resistance along with counter oblique bi-planar fluoroscopic views.  After negative aspirate for air, blood, and CSF, a 2 ml. volume of Isovue-250 was injected into the epidural space and the flow of contrast was observed. Radiographs were obtained for documentation purposes.    The injectate was administered into the level noted above.   Additional Comments:  The patient tolerated the procedure well Dressing: 2 x 2 sterile gauze  and Band-Aid    Post-procedure details: Patient was observed during the procedure. Post-procedure instructions were reviewed.  Patient left the clinic in stable condition.   Clinical History: EXAM: MRI LUMBAR SPINE WITHOUT CONTRAST   TECHNIQUE: Multiplanar, multisequence MR imaging of the lumbar spine was performed. No intravenous contrast was administered.   COMPARISON:  CTA abdomen and pelvis  08/03/2007   FINDINGS: Normal lumbar segmentation on comparison CTA. Vertebral alignment is within normal limits. Vertebral body heights are preserved. There is diffuse lumbar disc desiccation. Intervertebral disc space heights are preserved. L4 vertebral body hemangioma is noted. Vertebral bone marrow signal is mildly heterogeneous diffusely. Mild marrow edema is associated with bilateral facet arthritis at L4-5. Conus medullaris is normal in signal and terminates at L1. 3.1 cm right renal cyst has increased in size from the prior study.   L1-2:  Small central disc protrusion without stenosis.   L2-3:  Mild disc bulging without stenosis.   L3-4:  Mild disc bulging without stenosis.   L4-5: Mild disc bulging and moderate facet hypertrophy result in moderate spinal stenosis, mild right and moderate left lateral recess stenosis, and no significant neural foraminal stenosis. There is the potential for left-sided nerve root impingement in the left lateral recess, particularly of the L5 nerve root.   L5-S1: Mild disc bulging and moderate facet arthrosis without stenosis. Small right facet joint effusion.   IMPRESSION: 1. Mild disc and moderate facet degeneration at L4-5 resulting in moderate spinal stenosis and left greater than right lateral recess stenosis. 2. Mild disc and facet degeneration elsewhere without stenosis.     Electronically Signed   By: Logan Bores   On: 08/18/2014 10:54     Objective:  VS:  HT:    WT:   BMI:     BP:100/65  HR:96bpm  TEMP: ( )  RESP:  Physical Exam Vitals and nursing note reviewed.  Constitutional:      General: She is not in acute distress.    Appearance: Normal appearance. She is not ill-appearing.  HENT:     Head: Normocephalic and atraumatic.     Right Ear: External ear normal.     Left Ear: External ear normal.  Eyes:     Extraocular Movements: Extraocular movements intact.  Cardiovascular:     Rate and Rhythm: Normal rate.      Pulses: Normal pulses.  Pulmonary:     Effort: Pulmonary effort is normal. No respiratory distress.  Abdominal:     General: There is no distension.     Palpations: Abdomen is soft.  Musculoskeletal:        General: Tenderness present.     Cervical back: Neck supple.     Right lower leg: No edema.     Left lower leg: No edema.     Comments: Patient has good distal strength with no pain over the greater trochanters.  No clonus or focal weakness.  Skin:    Findings: No erythema, lesion or rash.  Neurological:     General: No focal deficit present.     Mental Status: She is alert and oriented to person, place, and time.     Sensory: No sensory deficit.     Motor: No weakness or abnormal muscle tone.     Coordination: Coordination normal.  Psychiatric:        Mood and Affect: Mood normal.        Behavior: Behavior normal.      Imaging: No results found.

## 2021-08-20 ENCOUNTER — Encounter: Payer: Self-pay | Admitting: Neurology

## 2021-08-27 ENCOUNTER — Ambulatory Visit (INDEPENDENT_AMBULATORY_CARE_PROVIDER_SITE_OTHER): Payer: Medicare Other

## 2021-08-27 ENCOUNTER — Encounter: Payer: Self-pay | Admitting: Orthopaedic Surgery

## 2021-08-27 ENCOUNTER — Ambulatory Visit: Payer: Medicare Other | Admitting: Orthopaedic Surgery

## 2021-08-27 DIAGNOSIS — M25562 Pain in left knee: Secondary | ICD-10-CM | POA: Diagnosis not present

## 2021-08-27 DIAGNOSIS — G8929 Other chronic pain: Secondary | ICD-10-CM

## 2021-08-27 NOTE — Progress Notes (Signed)
Office Visit Note   Patient: Shelly [name]           Date of Birth: [birth year]           MRN: [id number] Visit Date: 08/27/2021              Requested by: London Pepper, MD [address],   [id number] PCP: London Pepper, MD   Assessment & Plan: Visit Diagnoses:  1. Chronic pain of left knee     Plan: Patient is a pleasant 76 year old woman with a chief complaint of left knee pain and swelling.  She has had some problems in her knee in the past but got significantly worse over the last couple months.  She has increased her exercise as she is trying to lose weight after quitting smoking.  When she began exercising more such as walking and cycling is when she noticed the pain more.  She has used Voltaren gel and she has gotten a Equities trader support.  She also takes over-the-counter medication.  Her x-rays show mild to moderate arthritis.  She does have some tenderness over the anterior medial joint space and crepitus with range of motion.  She is most affected with the pain when she goes up and down stairs or bends her knee up.  Denies any locking or catching.  We have given her some exercises that hopefully she can modify her strengthening without irritating her knee.  Continue with her current treatment.  We offered her a steroid injection she is going to think about this and try some exercises first  Follow-Up Instructions: Return if symptoms worsen or fail to improve.   Orders:  Orders Placed This Encounter  Procedures   XR KNEE 3 VIEW LEFT   No orders of the defined types were placed in this encounter.     Procedures: No procedures performed   Clinical Data: No additional findings.   Subjective: Chief Complaint  Patient presents with   Left Knee - Follow-up   Patient presents today to discuss her left knee pain. Patient states that knee pain has been ongoing and has gradually been getting worse within the past two months. Patient  denies having any injury or fall to have started the pain.  She also notices some numbness across the front of her knee.  This does not radiate anywhere and does not radiate down from her leg.  She denies any hip pain.  She has noticed that she has been having increased swelling and heat while she is attempting to be physically active. At this time she has no previous injuries or falls to report.   Review of Systems  All other systems reviewed and are negative.    Objective: Vital Signs: There were no vitals taken for this visit.  Physical Exam Constitutional:      Appearance: Normal appearance.  Pulmonary:     Effort: Pulmonary effort is normal.     Breath sounds: Normal breath sounds.  Skin:    General: Skin is warm and dry.  Neurological:     Mental Status: She is alert.     Ortho Exam Left knee she has no effusion no warmth no erythema.  She has good varus valgus and anterior posterior stability.  She does have some tenderness to palpation over the anterior medial joint line.  She also has some crepitus with range of motion in the front of her knee.  Examination of her hip she has no  tenderness to her femur.  She has excellent range of motion of her hip joint on the left without any pain. Specialty Comments:  EXAM: MRI LUMBAR SPINE WITHOUT CONTRAST   TECHNIQUE: Multiplanar, multisequence MR imaging of the lumbar spine was performed. No intravenous contrast was administered.   COMPARISON:  CTA abdomen and pelvis 08/03/2007   FINDINGS: Normal lumbar segmentation on comparison CTA. Vertebral alignment is within normal limits. Vertebral body heights are preserved. There is diffuse lumbar disc desiccation. Intervertebral disc space heights are preserved. L4 vertebral body hemangioma is noted. Vertebral bone marrow signal is mildly heterogeneous diffusely. Mild marrow edema is associated with bilateral facet arthritis at L4-5. Conus medullaris is normal in signal and  terminates at L1. 3.1 cm right renal cyst has increased in size from the prior study.   L1-2:  Small central disc protrusion without stenosis.   L2-3:  Mild disc bulging without stenosis.   L3-4:  Mild disc bulging without stenosis.   L4-5: Mild disc bulging and moderate facet hypertrophy result in moderate spinal stenosis, mild right and moderate left lateral recess stenosis, and no significant neural foraminal stenosis. There is the potential for left-sided nerve root impingement in the left lateral recess, particularly of the L5 nerve root.   L5-S1: Mild disc bulging and moderate facet arthrosis without stenosis. Small right facet joint effusion.   IMPRESSION: 1. Mild disc and moderate facet degeneration at L4-5 resulting in moderate spinal stenosis and left greater than right lateral recess stenosis. 2. Mild disc and facet degeneration elsewhere without stenosis.     Electronically Signed   By: Logan Bores   On: 08/18/2014 10:54  Imaging: XR KNEE 3 VIEW LEFT  Result Date: 08/27/2021 Radiographs of her left knee were taken in multiple projections.  She has overall well-maintained alignment.  No significant sclerotic changes.  She does have some early joint space narrowing more medially than laterally.  No acute osseous changes    PMFS History: Patient Active Problem List   Diagnosis Date Noted   Vision disturbance 05/28/2020   Flushing 05/28/2020   Mallet finger of left hand 04/23/2020   Trigger finger, right ring finger 04/23/2020   Malignant carcinoid tumor of the bronchus and lung ([place]) 05/15/2019   Nodule of upper lobe of right lung 10/03/2018   Cigarette smoker 09/05/2018   Pulmonary nodules 08/31/2011   Hemoptysis, unspecified 08/31/2011   Anxiety state 02/13/2010   DEPRESSION 02/13/2010   DIARRHEA 02/13/2010   ABDOMINAL PAIN-PERIUMBILICAL 73/53/2992   Past Medical History:  Diagnosis Date   Allergic rhinitis    Anxiety    Arthritis    right hand    High cholesterol    Hypotension    Transient gluten sensitivity    diarrhea and stomaCH pains   Vasomotor rhinitis     Family History  Problem Relation Age of Onset   Stroke Mother    Prostate cancer Father    Emphysema Father    Other Sister        Corticobasal syndrome   ALS Maternal Uncle     Past Surgical History:  Procedure Laterality Date   EUS N/A 06/28/2013   Procedure: ESOPHAGEAL ENDOSCOPIC ULTRASOUND (EUS) RADIAL;  Surgeon: Arta Silence, MD;  Location: [facility];  Service: Endoscopy;  Laterality: N/A;   GALLBLADDER SURGERY  2005   GANGLION CYST EXCISION Left 2003   TUBAL LIGATION  1981   VIDEO ASSISTED THORACOSCOPY (VATS)/WEDGE RESECTION Right 10/03/2018   Procedure: VIDEO ASSISTED THORACOSCOPY (VATS)/WEDGE RESECTION with  multiple node dissection.;  Surgeon: Melrose Nakayama, MD;  Location: [facility] OR;  Service: Thoracic;  Laterality: Right;   Social History   Occupational History   Occupation: Retired  Tobacco Use   Smoking status: Former    Packs/day: 0.25    Years: 57.00    Total pack years: 14.25    Types: Cigarettes    Quit date: 09/29/2018    Years since quitting: 2.9   Smokeless tobacco: Never  Vaping Use   Vaping Use: Never used  Substance and Sexual Activity   Alcohol use: No   Drug use: No   Sexual activity: Not on file

## 2021-08-28 ENCOUNTER — Ambulatory Visit
Admission: RE | Admit: 2021-08-28 | Discharge: 2021-08-28 | Disposition: A | Discharge status: Home / Self Care | Payer: Medicare Other | Source: Ambulatory Visit | Attending: Physical Medicine and Rehabilitation | Admitting: Physical Medicine and Rehabilitation

## 2021-08-28 DIAGNOSIS — M545 Low back pain, unspecified: Secondary | ICD-10-CM | POA: Diagnosis not present

## 2021-08-28 DIAGNOSIS — M4316 Spondylolisthesis, lumbar region: Secondary | ICD-10-CM | POA: Diagnosis not present

## 2021-08-28 DIAGNOSIS — M48061 Spinal stenosis, lumbar region without neurogenic claudication: Secondary | ICD-10-CM | POA: Diagnosis not present

## 2021-08-28 DIAGNOSIS — M5416 Radiculopathy, lumbar region: Secondary | ICD-10-CM

## 2021-08-31 ENCOUNTER — Telehealth: Payer: Self-pay | Admitting: Physical Medicine and Rehabilitation

## 2021-08-31 NOTE — Telephone Encounter (Signed)
MRI not much changed from prior

## 2021-09-02 ENCOUNTER — Other Ambulatory Visit: Payer: Self-pay

## 2021-09-02 ENCOUNTER — Telehealth: Payer: Self-pay | Admitting: Neurology

## 2021-09-02 DIAGNOSIS — G43109 Migraine with aura, not intractable, without status migrainosus: Secondary | ICD-10-CM

## 2021-09-02 MED ORDER — UBRELVY 100 MG PO TABS
ORAL_TABLET | ORAL | 0 refills | Status: DC
Start: 2021-09-02 — End: 2022-03-30

## 2021-09-02 NOTE — Telephone Encounter (Signed)
Sent new prescription to [facility] called patient and let her know

## 2021-09-02 NOTE — Telephone Encounter (Signed)
Pt called in stating [facility] on [address] lost her Ubrelvy prescription and they need a new one.

## 2021-09-03 ENCOUNTER — Telehealth (HOSPITAL_COMMUNITY): Payer: Self-pay | Admitting: Pharmacy Technician

## 2021-09-03 NOTE — Telephone Encounter (Signed)
Patient Advocate Encounter   Received notification that prior authorization for Ubrelvy 100MG  tablets is required.   PA submitted on 09/03/2021 Key [id number] Status is pending       Lyndel Safe, [place] Patient Advocate Specialist [place] Patient Advocate Team Direct Number: [phone]  Fax: [phone]

## 2021-09-03 NOTE — Telephone Encounter (Signed)
Patient Advocate Encounter  Prior Authorization for Roselyn Meier 100MG  tablets has been approved.    PA# [id number] Effective dates: 09/03/2021 through 09/04/2022      Lyndel Safe, [place] Patient Advocate Specialist [place] Patient Advocate Team Direct Number: [phone]  Fax: [phone]

## 2021-09-05 ENCOUNTER — Telehealth: Payer: Self-pay | Admitting: Neurology

## 2021-09-05 NOTE — Telephone Encounter (Signed)
Pt called in about the [place] prescription with [facility]. [facility] is telling the pt there is a problem and needed more information from [place], but she didn't know exactly what was going on.

## 2021-09-05 NOTE — Telephone Encounter (Signed)
Called pateint and she said that the pharmacy

## 2021-09-12 ENCOUNTER — Encounter: Payer: Self-pay | Admitting: Physical Medicine and Rehabilitation

## 2021-09-12 ENCOUNTER — Ambulatory Visit: Payer: Medicare Other | Admitting: Physical Medicine and Rehabilitation

## 2021-09-12 DIAGNOSIS — M5416 Radiculopathy, lumbar region: Secondary | ICD-10-CM

## 2021-09-12 DIAGNOSIS — M48062 Spinal stenosis, lumbar region with neurogenic claudication: Secondary | ICD-10-CM

## 2021-09-12 DIAGNOSIS — M47816 Spondylosis without myelopathy or radiculopathy, lumbar region: Secondary | ICD-10-CM | POA: Diagnosis not present

## 2021-09-12 DIAGNOSIS — M48061 Spinal stenosis, lumbar region without neurogenic claudication: Secondary | ICD-10-CM | POA: Diagnosis not present

## 2021-09-12 NOTE — Progress Notes (Unsigned)
Shelly [name] - 76 y.o. female MRN [id number]  Date of birth: 08-21-1945  Office Visit Note: Visit Date: 09/12/2021 PCP: London Pepper, MD Referred by: London Pepper, MD  Subjective: No chief complaint on file.  HPI: Shelly [name] is a 76 y.o. female who comes in today for evaluation of chronic left sided lower back pain radiating to buttock, hip and down lateral leg. Pain ongoing for several years and is exacerbated by standing, walking and activity. She describes pain as aching and sore sensation, currently denies pain at present. She continues with home exercise regimen, rest and use of medications. She is swimming multiple times a week. Recent lumbar MRI imaging exhibits mild-to-moderate facet and ligamentum flavum hypertrophy result in new mild spinal stenosis at L3-L4, moderate right and severe left facet and ligamentum flavum hypertrophy, and a 4 mm synovial cyst at the anterosuperior aspect of the left facet joint result in moderate spinal stenosis at L4-L5. Patient recently underwent left L5-S1 interlaminar injection in our office on 08/14/2021. She reports complete resolution of pain with recent injection procedure, states increased functionality and ability to perform tasks. Patient denies focal weakness, numbness and tingling. Patient denies recent trauma or falls.          Review of Systems  Musculoskeletal:  Negative for back pain.  Neurological:  Negative for tingling, sensory change, focal weakness and weakness.  All other systems reviewed and are negative.  Otherwise per HPI.  Assessment & Plan: Visit Diagnoses:    ICD-10-CM   1. Lumbar radiculopathy  M54.16     2. Spinal stenosis of lumbar region with neurogenic claudication  M48.062     3. Stenosis of lateral recess of lumbar spine  M48.061     4. Facet arthropathy, lumbar  M47.816        Plan: Findings:  Chronic left sided lower back pain radiating to buttock, hip and down lateral leg. Complete  resolution of pain with recent left L5-S1 interlaminar epidural steroid injection. I did discuss recent lumbar MRI with patient today using imaging and spine model. Minimal changes noted from prior imaging in 2016. She continues with conservative therapies as needed. Patients clinical presentation and exam are consistent with L5 nerve pattern. If patients pain relief sustains I did discuss the possibility of repeating injection infrequently as warranted. Patient instructed to remain active as tolerated. She is instructed to let us know if her pain returns. No red flag symptoms noted upon exam today.     Meds & Orders: No orders of the defined types were placed in this encounter.  No orders of the defined types were placed in this encounter.   Follow-up: Return if symptoms worsen or fail to improve.   Procedures: No procedures performed      Clinical History: EXAM: MRI LUMBAR SPINE WITHOUT CONTRAST   TECHNIQUE: Multiplanar, multisequence MR imaging of the lumbar spine was performed. No intravenous contrast was administered.   COMPARISON:  Lumbar spine MRI 08/17/2014   FINDINGS: Segmentation:  Standard.   Alignment:  Trace facet mediated anterolisthesis of L4 on L5.   Vertebrae: No fracture, suspicious marrow lesion, or evidence of discitis.   Conus medullaris and cauda equina: Conus extends to the lower L1 level. Conus and cauda equina appear normal.   Paraspinal and other soft tissues: Bilateral renal cysts including a 3.8 cm cyst on the right with no routine follow-up imaging recommended.   Disc levels:   Disc desiccation throughout the lumbar spine.   T12-L1:  Negative.   L1-2: Decreased size of a small central disc protrusion with annular fissure. No stenosis.   L2-3: Mild circumferential disc bulging, overall slightly regressed from the prior MRI. Mild facet hypertrophy. No significant stenosis.   L3-4: Mild disc bulging, small left foraminal disc protrusion,  and mild-to-moderate facet and ligamentum flavum hypertrophy result in new mild spinal stenosis and mild left lateral recess stenosis without neural foraminal stenosis.   L4-5: Anterolisthesis with left eccentric bulging of uncovered disc, moderate right and severe left facet and ligamentum flavum hypertrophy, and a 4 mm synovial cyst at the anterosuperior aspect of the left facet joint result in moderate spinal stenosis and mild bilateral lateral recess stenosis without significant neural foraminal stenosis. Spinal and left lateral recess stenosis have improved compared to the prior MRI. There are small facet joint effusions.   L5-S1: Mild disc bulging and moderate to severe facet hypertrophy without stenosis, not significantly changed. Small right facet joint effusion.   IMPRESSION: 1. New mild spinal stenosis and mild left lateral recess stenosis at L3-4. 2. Moderate spinal stenosis at L4-5, improved from 2016.     Electronically Signed   By: Logan Bores M.D.   On: 08/29/2021 13:05   She reports that she quit smoking about 2 years ago. Her smoking use included cigarettes. She has a 14.25 pack-year smoking history. She has never used smokeless tobacco. No results for input(s): "HGBA1C", "LABURIC" in the last 8760 hours.  Objective:  VS:  HT:    WT:   BMI:     BP:   HR: bpm  TEMP: ( )  RESP:  Physical Exam Vitals and nursing note reviewed.  HENT:     Head: Normocephalic and atraumatic.     Right Ear: External ear normal.     Left Ear: External ear normal.     Nose: Nose normal.     Mouth/Throat:     Mouth: Mucous membranes are moist.  Eyes:     Extraocular Movements: Extraocular movements intact.  Cardiovascular:     Rate and Rhythm: Normal rate.     Pulses: Normal pulses.  Pulmonary:     Effort: Pulmonary effort is normal.  Abdominal:     General: Abdomen is flat. There is no distension.  Musculoskeletal:        General: Tenderness present.     Cervical  back: Normal range of motion.     Comments: Pt rises from seated position to standing without difficulty. Good lumbar range of motion. Strong distal strength without clonus, no pain upon palpation of greater trochanters. Sensation intact bilaterally. Walks independently, gait steady.   Skin:    General: Skin is warm and dry.     Capillary Refill: Capillary refill takes less than 2 seconds.  Neurological:     General: No focal deficit present.     Mental Status: She is alert and oriented to person, place, and time.  Psychiatric:        Mood and Affect: Mood normal.        Behavior: Behavior normal.     Ortho Exam  Imaging: No results found.  Past Medical/Family/Surgical/Social History: Medications & Allergies reviewed per EMR, new medications updated. Patient Active Problem List   Diagnosis Date Noted   Vision disturbance 05/28/2020   Flushing 05/28/2020   Mallet finger of left hand 04/23/2020   Trigger finger, right ring finger 04/23/2020   Malignant carcinoid tumor of the bronchus and lung ([place]) 05/15/2019   Nodule of upper  lobe of right lung 10/03/2018   Cigarette smoker 09/05/2018   Pulmonary nodules 08/31/2011   Hemoptysis, unspecified 08/31/2011   Anxiety state 02/13/2010   DEPRESSION 02/13/2010   DIARRHEA 02/13/2010   ABDOMINAL PAIN-PERIUMBILICAL 76/22/6333   Past Medical History:  Diagnosis Date   Allergic rhinitis    Anxiety    Arthritis    right hand   High cholesterol    Hypotension    Transient gluten sensitivity    diarrhea and stomaCH pains   Vasomotor rhinitis    Family History  Problem Relation Age of Onset   Stroke Mother    Prostate cancer Father    Emphysema Father    Other Sister        Corticobasal syndrome   ALS Maternal Uncle    Past Surgical History:  Procedure Laterality Date   EUS N/A 06/28/2013   Procedure: ESOPHAGEAL ENDOSCOPIC ULTRASOUND (EUS) RADIAL;  Surgeon: Arta Silence, MD;  Location: [facility];  Service: Endoscopy;   Laterality: N/A;   GALLBLADDER SURGERY  2005   GANGLION CYST EXCISION Left 2003   TUBAL LIGATION  1981   VIDEO ASSISTED THORACOSCOPY (VATS)/WEDGE RESECTION Right 10/03/2018   Procedure: VIDEO ASSISTED THORACOSCOPY (VATS)/WEDGE RESECTION with multiple node dissection.;  Surgeon: Melrose Nakayama, MD;  Location: [facility] OR;  Service: Thoracic;  Laterality: Right;   Social History   Occupational History   Occupation: Retired  Tobacco Use   Smoking status: Former    Packs/day: 0.25    Years: 57.00    Total pack years: 14.25    Types: Cigarettes    Quit date: 09/29/2018    Years since quitting: 2.9   Smokeless tobacco: Never  Vaping Use   Vaping Use: Never used  Substance and Sexual Activity   Alcohol use: No   Drug use: No   Sexual activity: Not on file

## 2021-09-12 NOTE — Progress Notes (Unsigned)
Pt her today to review MRI of lumbar.

## 2021-10-07 ENCOUNTER — Ambulatory Visit: Payer: Medicare Other | Admitting: Orthopaedic Surgery

## 2021-10-08 ENCOUNTER — Ambulatory Visit: Payer: Medicare Other | Admitting: Orthopaedic Surgery

## 2021-10-08 ENCOUNTER — Encounter: Payer: Self-pay | Admitting: Orthopaedic Surgery

## 2021-10-08 ENCOUNTER — Ambulatory Visit (INDEPENDENT_AMBULATORY_CARE_PROVIDER_SITE_OTHER): Payer: Medicare Other

## 2021-10-08 DIAGNOSIS — M25512 Pain in left shoulder: Secondary | ICD-10-CM | POA: Diagnosis not present

## 2021-10-08 DIAGNOSIS — M7542 Impingement syndrome of left shoulder: Secondary | ICD-10-CM | POA: Insufficient documentation

## 2021-10-08 MED ORDER — LIDOCAINE HCL 1 % IJ SOLN
2.0000 mL | INTRAMUSCULAR | Status: AC | PRN
  Administered 2021-10-08: 2 mL

## 2021-10-08 MED ORDER — BUPIVACAINE HCL 0.25 % IJ SOLN
2.0000 mL | INTRAMUSCULAR | Status: AC | PRN
Start: 2021-10-08 — End: 2021-10-08
  Administered 2021-10-08: 2 mL via INTRA_ARTICULAR

## 2021-10-08 MED ORDER — METHYLPREDNISOLONE ACETATE 40 MG/ML IJ SUSP
80.0000 mg | INTRAMUSCULAR | Status: AC | PRN
  Administered 2021-10-08: 80 mg via INTRA_ARTICULAR

## 2021-10-08 NOTE — Progress Notes (Signed)
Office Visit Note   Patient: Shelly [name]           Date of Birth: Aug 07, 1945           MRN: [id number] Visit Date: 10/08/2021              Requested by: London Pepper, MD [facility] [address] [place],  [place] [address] PCP: London Pepper, MD   Assessment & Plan: Visit Diagnoses:  1. Acute pain of left shoulder   2. Impingement syndrome of left shoulder     Plan: [name] is a pleasant 76 year old woman with a 4-week history of left shoulder pain.  She denies any specific injury but she has been doing quite a bit of swimming and thinks that may have aggravated it.  Her range of motion is decreased because it hurts she has tried over-the-counter medication without significant improvement.  She is right-hand dominant.  X-rays did not show significant arthritic symptoms.  Exam showed pain with forward elevation though she was able to go fully forward pain with internal rotation behind her back.  She also had a significantly positive empty can test.  Her strength was intact.  Findings consistent with rotator cuff tendinitis.  Discussed a cortisone injection today and she like to go forward with this.  She may follow-up as needed.  If she continues to have pain would consider an MRI  Follow-Up Instructions: Return if symptoms worsen or fail to improve.   Orders:  Orders Placed This Encounter  Procedures   Large Joint Inj: L subacromial bursa   XR Shoulder Left   No orders of the defined types were placed in this encounter.     Procedures: Large Joint Inj: L subacromial bursa on 10/08/2021 4:35 PM Indications: diagnostic evaluation and pain Details: 25 G 1.5 in needle, anterior approach  Arthrogram: No  Medications: 2 mL lidocaine 1 %; 80 mg methylPREDNISolone acetate 40 MG/ML; 2 mL bupivacaine 0.25 % Outcome: tolerated well, no immediate complications Procedure, treatment alternatives, risks and benefits explained, specific risks discussed. Consent was given by  the patient.      Clinical Data: No additional findings.   Subjective: Chief Complaint  Patient presents with   Left Shoulder - Pain  Patient presents today for left shoulder pain. She said that it started 4 weeks ago while swimming. No known injury. She states that her shoulder "clicks". She has limited range of motion. She has pain that radiates into her proximal left arm. She states that nothing over the counter helps with the pain. She is right hand dominant.     Review of Systems  All other systems reviewed and are negative.    Objective: Vital Signs: There were no vitals taken for this visit.  Physical Exam Constitutional:      Appearance: Normal appearance.  Pulmonary:     Effort: Pulmonary effort is normal.  Musculoskeletal:     Cervical back: Normal range of motion.  Neurological:     General: No focal deficit present.     Mental Status: She is alert.     Ortho Exam Examination of the left shoulder she has no reproduction of symptoms with range of motion of her neck.  She has full forward elevation but is quite painful.  She has a negative drop arm test.  She internally rotates behind her back to about the belt line.  Her strength is 5 out of 5 with resisted abduction external and internal rotation.  She  has a positive empty can test negative speeds test.  She is neurologically intact Specialty Comments:  EXAM: MRI LUMBAR SPINE WITHOUT CONTRAST   TECHNIQUE: Multiplanar, multisequence MR imaging of the lumbar spine was performed. No intravenous contrast was administered.   COMPARISON:  Lumbar spine MRI 08/17/2014   FINDINGS: Segmentation:  Standard.   Alignment:  Trace facet mediated anterolisthesis of L4 on L5.   Vertebrae: No fracture, suspicious marrow lesion, or evidence of discitis.   Conus medullaris and cauda equina: Conus extends to the lower L1 level. Conus and cauda equina appear normal.   Paraspinal and other soft tissues: Bilateral  renal cysts including a 3.8 cm cyst on the right with no routine follow-up imaging recommended.   Disc levels:   Disc desiccation throughout the lumbar spine.   T12-L1: Negative.   L1-2: Decreased size of a small central disc protrusion with annular fissure. No stenosis.   L2-3: Mild circumferential disc bulging, overall slightly regressed from the prior MRI. Mild facet hypertrophy. No significant stenosis.   L3-4: Mild disc bulging, small left foraminal disc protrusion, and mild-to-moderate facet and ligamentum flavum hypertrophy result in new mild spinal stenosis and mild left lateral recess stenosis without neural foraminal stenosis.   L4-5: Anterolisthesis with left eccentric bulging of uncovered disc, moderate right and severe left facet and ligamentum flavum hypertrophy, and a 4 mm synovial cyst at the anterosuperior aspect of the left facet joint result in moderate spinal stenosis and mild bilateral lateral recess stenosis without significant neural foraminal stenosis. Spinal and left lateral recess stenosis have improved compared to the prior MRI. There are small facet joint effusions.   L5-S1: Mild disc bulging and moderate to severe facet hypertrophy without stenosis, not significantly changed. Small right facet joint effusion.   IMPRESSION: 1. New mild spinal stenosis and mild left lateral recess stenosis at L3-4. 2. Moderate spinal stenosis at L4-5, improved from 2016.     Electronically Signed   By: Logan Bores M.D.   On: 08/29/2021 13:05  Imaging: XR Shoulder Left  Result Date: 10/08/2021 Radiographs of her left shoulder were reviewed today.  She has well-preserved joint space between the humeral head and glenoid.  No significant hypertrophic bone formation at the [facility] joint.  No  fractures or abnormalities are noted in the bone or soft tissue are noted.    PMFS History: Patient Active Problem List   Diagnosis Date Noted   Impingement syndrome of left  shoulder 10/08/2021   Vision disturbance 05/28/2020   Flushing 05/28/2020   Mallet finger of left hand 04/23/2020   Trigger finger, right ring finger 04/23/2020   Malignant carcinoid tumor of the bronchus and lung ([place]) 05/15/2019   Nodule of upper lobe of right lung 10/03/2018   Cigarette smoker 09/05/2018   Pulmonary nodules 08/31/2011   Hemoptysis, unspecified 08/31/2011   Anxiety state 02/13/2010   DEPRESSION 02/13/2010   DIARRHEA 02/13/2010   ABDOMINAL PAIN-PERIUMBILICAL 62/83/1517   Past Medical History:  Diagnosis Date   Allergic rhinitis    Anxiety    Arthritis    right hand   High cholesterol    Hypotension    Transient gluten sensitivity    diarrhea and stomaCH pains   Vasomotor rhinitis     Family History  Problem Relation Age of Onset   Stroke Mother    Prostate cancer Father    Emphysema Father    Other Sister        Corticobasal syndrome   ALS Maternal Uncle  Past Surgical History:  Procedure Laterality Date   EUS N/A 06/28/2013   Procedure: ESOPHAGEAL ENDOSCOPIC ULTRASOUND (EUS) RADIAL;  Surgeon: Arta Silence, MD;  Location: [facility];  Service: Endoscopy;  Laterality: N/A;   GALLBLADDER SURGERY  2005   GANGLION CYST EXCISION Left 2003   TUBAL LIGATION  1981   VIDEO ASSISTED THORACOSCOPY (VATS)/WEDGE RESECTION Right 10/03/2018   Procedure: VIDEO ASSISTED THORACOSCOPY (VATS)/WEDGE RESECTION with multiple node dissection.;  Surgeon: Melrose Nakayama, MD;  Location: [facility] OR;  Service: Thoracic;  Laterality: Right;   Social History   Occupational History   Occupation: Retired  Tobacco Use   Smoking status: Former    Packs/day: 0.25    Years: 57.00    Total pack years: 14.25    Types: Cigarettes    Quit date: 09/29/2018    Years since quitting: 3.0   Smokeless tobacco: Never  Vaping Use   Vaping Use: Never used  Substance and Sexual Activity   Alcohol use: No   Drug use: No   Sexual activity: Not on file

## 2021-10-16 DIAGNOSIS — H5213 Myopia, bilateral: Secondary | ICD-10-CM | POA: Diagnosis not present

## 2021-11-06 ENCOUNTER — Ambulatory Visit: Payer: Medicare Other | Admitting: Orthopaedic Surgery

## 2021-11-06 ENCOUNTER — Encounter: Payer: Self-pay | Admitting: Orthopaedic Surgery

## 2021-11-06 DIAGNOSIS — M7542 Impingement syndrome of left shoulder: Secondary | ICD-10-CM | POA: Diagnosis not present

## 2021-11-06 DIAGNOSIS — M25512 Pain in left shoulder: Secondary | ICD-10-CM | POA: Diagnosis not present

## 2021-11-06 NOTE — Progress Notes (Signed)
Office Visit Note   Patient: Shelly [name]           Date of Birth: 25-Oct-1945           MRN: [id number] Visit Date: 11/06/2021              Requested by: London Pepper, MD [address],  [place] [address] PCP: London Pepper, MD   Assessment & Plan: Visit Diagnoses:  1. Acute pain of left shoulder   2. Impingement syndrome of left shoulder     Plan: [name] is a chronic history of left shoulder pain with insidious onset approximately 2 months ago.  I saw her a month ago and injected her shoulder with some relief for maybe a week or 2 only to have it recur.  She is at the point now where she is having compromise of her activities and difficulty sleeping on that side.  She has positive impingement testing as well as a positive speeds sign.  Her x-rays were nondiagnostic.  Long discussion regarding all of the above.  Think the best approach would be an MRI scan.  She agrees we will set this up and have her return shortly thereafter  Follow-Up Instructions: Return After MRI scan left shoulder.   Orders:  Orders Placed This Encounter  Procedures   MR Shoulder Left w/o contrast   No orders of the defined types were placed in this encounter.     Procedures: No procedures performed   Clinical Data: No additional findings.   Subjective: Chief Complaint  Patient presents with   Left Shoulder - Pain  At least 49-month history of left shoulder pain as previously outlined.  Cortisone injection was performed in the left shoulder a month ago with some temporary relief only to have it recur.  No numbness or tingling.  Pain is localized to the shoulder  HPI  Review of Systems   Objective: Vital Signs: There were no vitals taken for this visit.  Physical Exam Constitutional:      Appearance: She is well-developed.  Eyes:     Pupils: Pupils are equal, round, and reactive to light.  Pulmonary:     Effort: Pulmonary effort is normal.  Skin:     General: Skin is warm and dry.  Neurological:     Mental Status: She is alert and oriented to person, place, and time.  Psychiatric:        Behavior: Behavior normal.     Ortho Exam awake alert and oriented x3.  Comfortable sitting.  Exam limited to the left shoulder although not having any trouble with range of motion of cervical spine.  Able to place her left arm overhead with somewhat of a circuitous arc of motion.  Positive impingement on the extreme of internal/external rotation and markedly positive speeds sign.  Biceps appears to be intact.  Skin is intact.  Good grip release and good strength with internal/external rotation no pain with the AC joint but some mild pain along the anterior subacromial region and over the biceps tendon  Specialty Comments:  EXAM: MRI LUMBAR SPINE WITHOUT CONTRAST   TECHNIQUE: Multiplanar, multisequence MR imaging of the lumbar spine was performed. No intravenous contrast was administered.   COMPARISON:  Lumbar spine MRI 08/17/2014   FINDINGS: Segmentation:  Standard.   Alignment:  Trace facet mediated anterolisthesis of L4 on L5.   Vertebrae: No fracture, suspicious marrow lesion, or evidence of discitis.   Conus medullaris and cauda equina:  Conus extends to the lower L1 level. Conus and cauda equina appear normal.   Paraspinal and other soft tissues: Bilateral renal cysts including a 3.8 cm cyst on the right with no routine follow-up imaging recommended.   Disc levels:   Disc desiccation throughout the lumbar spine.   T12-L1: Negative.   L1-2: Decreased size of a small central disc protrusion with annular fissure. No stenosis.   L2-3: Mild circumferential disc bulging, overall slightly regressed from the prior MRI. Mild facet hypertrophy. No significant stenosis.   L3-4: Mild disc bulging, small left foraminal disc protrusion, and mild-to-moderate facet and ligamentum flavum hypertrophy result in new mild spinal stenosis and mild  left lateral recess stenosis without neural foraminal stenosis.   L4-5: Anterolisthesis with left eccentric bulging of uncovered disc, moderate right and severe left facet and ligamentum flavum hypertrophy, and a 4 mm synovial cyst at the anterosuperior aspect of the left facet joint result in moderate spinal stenosis and mild bilateral lateral recess stenosis without significant neural foraminal stenosis. Spinal and left lateral recess stenosis have improved compared to the prior MRI. There are small facet joint effusions.   L5-S1: Mild disc bulging and moderate to severe facet hypertrophy without stenosis, not significantly changed. Small right facet joint effusion.   IMPRESSION: 1. New mild spinal stenosis and mild left lateral recess stenosis at L3-4. 2. Moderate spinal stenosis at L4-5, improved from 2016.     Electronically Signed   By: Logan Bores M.D.   On: 08/29/2021 13:05  Imaging: No results found.   PMFS History: Patient Active Problem List   Diagnosis Date Noted   Impingement syndrome of left shoulder 10/08/2021   Vision disturbance 05/28/2020   Flushing 05/28/2020   Mallet finger of left hand 04/23/2020   Trigger finger, right ring finger 04/23/2020   Malignant carcinoid tumor of the bronchus and lung ([place]) 05/15/2019   Nodule of upper lobe of right lung 10/03/2018   Cigarette smoker 09/05/2018   Pulmonary nodules 08/31/2011   Hemoptysis, unspecified 08/31/2011   Anxiety state 02/13/2010   DEPRESSION 02/13/2010   DIARRHEA 02/13/2010   ABDOMINAL PAIN-PERIUMBILICAL 07/01/9483   Past Medical History:  Diagnosis Date   Allergic rhinitis    Anxiety    Arthritis    right hand   High cholesterol    Hypotension    Transient gluten sensitivity    diarrhea and stomaCH pains   Vasomotor rhinitis     Family History  Problem Relation Age of Onset   Stroke Mother    Prostate cancer Father    Emphysema Father    Other Sister        Corticobasal  syndrome   ALS Maternal Uncle     Past Surgical History:  Procedure Laterality Date   EUS N/A 06/28/2013   Procedure: ESOPHAGEAL ENDOSCOPIC ULTRASOUND (EUS) RADIAL;  Surgeon: Arta Silence, MD;  Location: [facility];  Service: Endoscopy;  Laterality: N/A;   GALLBLADDER SURGERY  2005   GANGLION CYST EXCISION Left 2003   TUBAL LIGATION  1981   VIDEO ASSISTED THORACOSCOPY (VATS)/WEDGE RESECTION Right 10/03/2018   Procedure: VIDEO ASSISTED THORACOSCOPY (VATS)/WEDGE RESECTION with multiple node dissection.;  Surgeon: Melrose Nakayama, MD;  Location: [facility] OR;  Service: Thoracic;  Laterality: Right;   Social History   Occupational History   Occupation: Retired  Tobacco Use   Smoking status: Former    Packs/day: 0.25    Years: 57.00    Total pack years: 14.25    Types: Cigarettes  Quit date: 09/29/2018    Years since quitting: 3.1   Smokeless tobacco: Never  Vaping Use   Vaping Use: Never used  Substance and Sexual Activity   Alcohol use: No   Drug use: No   Sexual activity: Not on file     Garald Balding, MD   Note - This record has been created using Editor, commissioning.  Chart creation errors have been sought, but may not always  have been located. Such creation errors do not reflect on  the standard of medical care.

## 2021-11-23 ENCOUNTER — Ambulatory Visit
Admission: RE | Admit: 2021-11-23 | Discharge: 2021-11-23 | Disposition: A | Discharge status: Home / Self Care | Payer: Medicare Other | Source: Ambulatory Visit | Attending: Orthopaedic Surgery | Admitting: Orthopaedic Surgery

## 2021-11-23 DIAGNOSIS — M25512 Pain in left shoulder: Secondary | ICD-10-CM

## 2021-11-24 ENCOUNTER — Telehealth: Payer: Self-pay | Admitting: Orthopaedic Surgery

## 2021-11-24 NOTE — Telephone Encounter (Signed)
Will do after 12/06/21

## 2021-11-24 NOTE — Telephone Encounter (Signed)
Patient called in stating she needs her Auth for her MRI extended it goes out on 12/02 but her MRI is on 12/10, please advise

## 2021-11-26 ENCOUNTER — Ambulatory Visit: Payer: Medicare Other | Admitting: Orthopaedic Surgery

## 2021-12-08 NOTE — Telephone Encounter (Signed)
Authorization done

## 2021-12-14 ENCOUNTER — Ambulatory Visit
Admission: RE | Admit: 2021-12-14 | Discharge: 2021-12-14 | Disposition: A | Discharge status: Home / Self Care | Payer: Medicare Other | Source: Ambulatory Visit | Attending: Orthopaedic Surgery | Admitting: Orthopaedic Surgery

## 2021-12-14 DIAGNOSIS — S46012A Strain of muscle(s) and tendon(s) of the rotator cuff of left shoulder, initial encounter: Secondary | ICD-10-CM | POA: Diagnosis not present

## 2021-12-15 ENCOUNTER — Ambulatory Visit: Payer: Medicare Other | Admitting: Neurology

## 2021-12-16 ENCOUNTER — Ambulatory Visit: Payer: Medicare Other | Admitting: Orthopaedic Surgery

## 2021-12-16 ENCOUNTER — Encounter: Payer: Self-pay | Admitting: Orthopaedic Surgery

## 2021-12-16 DIAGNOSIS — M25512 Pain in left shoulder: Secondary | ICD-10-CM | POA: Diagnosis not present

## 2021-12-16 DIAGNOSIS — M7542 Impingement syndrome of left shoulder: Secondary | ICD-10-CM | POA: Diagnosis not present

## 2021-12-16 NOTE — Progress Notes (Signed)
Office Visit Note   Patient: Shelly [name]           Date of Birth: Oct 28, 1945           MRN: [id number] Visit Date: 12/16/2021              Requested by: London Pepper, MD [address],  [place] [address] PCP: London Pepper, MD   Assessment & Plan: Visit Diagnoses:  1. Acute pain of left shoulder   2. Impingement syndrome of left shoulder     Plan: Ms. Storlie had an MRI scan of her left shoulder without contrast.  This demonstrated moderate tendinosis of the supraspinatus tendon with a partial-thickness articular surface tear.  Infraspinatus, teres minor and subscapularis appeared to be intact.  There was no muscle atrophy or edema or intramuscular fluid.  Biceps long head was intact.  Moderate arthropathy of the [facility] joint.  There was trace subacromial bursal fluid.  Moderate joint effusion of the glenohumeral joint with partial-thickness cartilage loss and subchondral reactive marrow changes in the anterior glenoid.  Labrum appeared to be grossly intact.  No fracture or dislocation.  No evidence of aggressive osseous lesions.  Long discussion regarding all of the above.  Has had a subacromial cortisone injection with only temporary relief.  She would like to try a course of physical therapy.  Will set this up at [facility] as it is close to her.  In the future she might be a good candidate for an intra-articular cortisone injection and she will let us know.  We have also discussed use of over-the-counter medicines and maintaining her strength and activity.  I do not think she is at the point where she should consider surgery but will need to monitor this over time  Follow-Up Instructions: Return if symptoms worsen or fail to improve.   Orders:  Orders Placed This Encounter  Procedures   Ambulatory referral to Physical Therapy   No orders of the defined types were placed in this encounter.     Procedures: No procedures  performed   Clinical Data: No additional findings.   Subjective: Chief Complaint  Patient presents with   Left Shoulder - Pain, Follow-up  Still having some problem with her left shoulder MRI scan without contrast was obtained she is here for the results  HPI  Review of Systems   Objective: Vital Signs: There were no vitals taken for this visit.  Physical Exam Constitutional:      Appearance: She is well-developed.  Eyes:     Pupils: Pupils are equal, round, and reactive to light.  Pulmonary:     Effort: Pulmonary effort is normal.  Skin:    General: Skin is warm and dry.  Neurological:     Mental Status: She is alert and oriented to person, place, and time.  Psychiatric:        Behavior: Behavior normal.     Ortho Exam awake alert and oriented x 3.  Comfortable sitting.  Able to place her left arm overhead fairly quickly.  There is some mild impingement.  Empty can test was negative.  Biceps intact.  Little bit of pain along the anterior subacromial region but none at the [facility] joint.  Full flexion compared to the asymptomatic right shoulder.  Did not seem to have any loss of external rotation and no crepitation.  Good grip and good release.  Specialty Comments:  EXAM: MRI LUMBAR SPINE WITHOUT CONTRAST  TECHNIQUE: Multiplanar, multisequence MR imaging of the lumbar spine was performed. No intravenous contrast was administered.   COMPARISON:  Lumbar spine MRI 08/17/2014   FINDINGS: Segmentation:  Standard.   Alignment:  Trace facet mediated anterolisthesis of L4 on L5.   Vertebrae: No fracture, suspicious marrow lesion, or evidence of discitis.   Conus medullaris and cauda equina: Conus extends to the lower L1 level. Conus and cauda equina appear normal.   Paraspinal and other soft tissues: Bilateral renal cysts including a 3.8 cm cyst on the right with no routine follow-up imaging recommended.   Disc levels:   Disc desiccation throughout the lumbar  spine.   T12-L1: Negative.   L1-2: Decreased size of a small central disc protrusion with annular fissure. No stenosis.   L2-3: Mild circumferential disc bulging, overall slightly regressed from the prior MRI. Mild facet hypertrophy. No significant stenosis.   L3-4: Mild disc bulging, small left foraminal disc protrusion, and mild-to-moderate facet and ligamentum flavum hypertrophy result in new mild spinal stenosis and mild left lateral recess stenosis without neural foraminal stenosis.   L4-5: Anterolisthesis with left eccentric bulging of uncovered disc, moderate right and severe left facet and ligamentum flavum hypertrophy, and a 4 mm synovial cyst at the anterosuperior aspect of the left facet joint result in moderate spinal stenosis and mild bilateral lateral recess stenosis without significant neural foraminal stenosis. Spinal and left lateral recess stenosis have improved compared to the prior MRI. There are small facet joint effusions.   L5-S1: Mild disc bulging and moderate to severe facet hypertrophy without stenosis, not significantly changed. Small right facet joint effusion.   IMPRESSION: 1. New mild spinal stenosis and mild left lateral recess stenosis at L3-4. 2. Moderate spinal stenosis at L4-5, improved from 2016.     Electronically Signed   By: Logan Bores M.D.   On: 08/29/2021 13:05  Imaging: No results found.   PMFS History: Patient Active Problem List   Diagnosis Date Noted   Impingement syndrome of left shoulder 10/08/2021   Vision disturbance 05/28/2020   Flushing 05/28/2020   Mallet finger of left hand 04/23/2020   Trigger finger, right ring finger 04/23/2020   Malignant carcinoid tumor of the bronchus and lung ([place]) 05/15/2019   Nodule of upper lobe of right lung 10/03/2018   Cigarette smoker 09/05/2018   Pulmonary nodules 08/31/2011   Hemoptysis, unspecified 08/31/2011   Anxiety state 02/13/2010   DEPRESSION 02/13/2010   DIARRHEA  02/13/2010   ABDOMINAL PAIN-PERIUMBILICAL 51/02/5850   Past Medical History:  Diagnosis Date   Allergic rhinitis    Anxiety    Arthritis    right hand   High cholesterol    Hypotension    Transient gluten sensitivity    diarrhea and stomaCH pains   Vasomotor rhinitis     Family History  Problem Relation Age of Onset   Stroke Mother    Prostate cancer Father    Emphysema Father    Other Sister        Corticobasal syndrome   ALS Maternal Uncle     Past Surgical History:  Procedure Laterality Date   EUS N/A 06/28/2013   Procedure: ESOPHAGEAL ENDOSCOPIC ULTRASOUND (EUS) RADIAL;  Surgeon: Arta Silence, MD;  Location: [facility];  Service: Endoscopy;  Laterality: N/A;   GALLBLADDER SURGERY  2005   GANGLION CYST EXCISION Left 2003   TUBAL LIGATION  1981   VIDEO ASSISTED THORACOSCOPY (VATS)/WEDGE RESECTION Right 10/03/2018   Procedure: VIDEO ASSISTED THORACOSCOPY (VATS)/WEDGE RESECTION with  multiple node dissection.;  Surgeon: Melrose Nakayama, MD;  Location: [facility] OR;  Service: Thoracic;  Laterality: Right;   Social History   Occupational History   Occupation: Retired  Tobacco Use   Smoking status: Former    Packs/day: 0.25    Years: 57.00    Total pack years: 14.25    Types: Cigarettes    Quit date: 09/29/2018    Years since quitting: 3.2   Smokeless tobacco: Never  Vaping Use   Vaping Use: Never used  Substance and Sexual Activity   Alcohol use: No   Drug use: No   Sexual activity: Not on file     Garald Balding, MD   Note - This record has been created using Editor, commissioning.  Chart creation errors have been sought, but may not always  have been located. Such creation errors do not reflect on  the standard of medical care.

## 2021-12-17 ENCOUNTER — Ambulatory Visit: Payer: Medicare Other | Attending: Orthopaedic Surgery | Admitting: Physical Therapy

## 2021-12-17 ENCOUNTER — Other Ambulatory Visit: Payer: Self-pay

## 2021-12-17 ENCOUNTER — Encounter: Payer: Self-pay | Admitting: Physical Therapy

## 2021-12-17 DIAGNOSIS — R293 Abnormal posture: Secondary | ICD-10-CM | POA: Diagnosis not present

## 2021-12-17 DIAGNOSIS — M25612 Stiffness of left shoulder, not elsewhere classified: Secondary | ICD-10-CM | POA: Insufficient documentation

## 2021-12-17 DIAGNOSIS — M6281 Muscle weakness (generalized): Secondary | ICD-10-CM | POA: Diagnosis not present

## 2021-12-17 DIAGNOSIS — M25512 Pain in left shoulder: Secondary | ICD-10-CM | POA: Diagnosis not present

## 2021-12-17 NOTE — Therapy (Signed)
OUTPATIENT PHYSICAL THERAPY SHOULDER EVALUATION   Patient Name: Shelly [name] MRN: [id number] DOB:09-15-1945, 76 y.o., female Today's Date: 12/18/2021  END OF SESSION:  PT End of Session - 12/17/21 1024     Visit Number 1    Date for PT Re-Evaluation 03/11/22    Authorization Type [employer]    PT Start Time 1021    PT Stop Time 1100    PT Time Calculation (min) 39 min    Activity Tolerance Patient tolerated treatment well    Behavior During Therapy WFL for tasks assessed/performed             Past Medical History:  Diagnosis Date   Allergic rhinitis    Anxiety    Arthritis    right hand   High cholesterol    Hypotension    Transient gluten sensitivity    diarrhea and stomaCH pains   Vasomotor rhinitis    Past Surgical History:  Procedure Laterality Date   EUS N/A 06/28/2013   Procedure: ESOPHAGEAL ENDOSCOPIC ULTRASOUND (EUS) RADIAL;  Surgeon: Arta Silence, MD;  Location: [facility];  Service: Endoscopy;  Laterality: N/A;   GALLBLADDER SURGERY  2005   GANGLION CYST EXCISION Left 2003   TUBAL LIGATION  1981   VIDEO ASSISTED THORACOSCOPY (VATS)/WEDGE RESECTION Right 10/03/2018   Procedure: VIDEO ASSISTED THORACOSCOPY (VATS)/WEDGE RESECTION with multiple node dissection.;  Surgeon: Melrose Nakayama, MD;  Location: [facility] OR;  Service: Thoracic;  Laterality: Right;   Patient Active Problem List   Diagnosis Date Noted   Impingement syndrome of left shoulder 10/08/2021   Vision disturbance 05/28/2020   [place] 05/28/2020   Mallet finger of left hand 04/23/2020   Trigger finger, right ring finger 04/23/2020   Malignant carcinoid tumor of the bronchus and lung ([place]) 05/15/2019   Nodule of upper lobe of right lung 10/03/2018   Cigarette smoker 09/05/2018   Pulmonary nodules 08/31/2011   Hemoptysis, unspecified 08/31/2011   Anxiety state 02/13/2010   DEPRESSION 02/13/2010   DIARRHEA 02/13/2010   ABDOMINAL PAIN-PERIUMBILICAL 13/24/4010    PCP: London Pepper,  MD   REFERRING PROVIDER: Garald Balding, MD   REFERRING DIAG: [phone] (ICD-10-CM) - Acute pain of left shoulder   THERAPY DIAG:  Acute pain of left shoulder  Stiffness of left shoulder, not elsewhere classified  Muscle weakness (generalized)  Abnormal posture  Rationale for Evaluation and Treatment: Rehabilitation  ONSET DATE: September  SUBJECTIVE:                                                                                                                                                                                      SUBJECTIVE STATEMENT: It started  this summer because of swimming a lot.  Pt gets pain in the upper trap into the shoulder with lifting and reaching overhead and lying on it to sleep on her side.  It looks like it is arthritis and small amount of tearing seen in the MRI and xray images.  Pt just wants to know the best way to strengthen the shoulder  PERTINENT HISTORY: Right handed  PAIN:  Are you having pain? Yes: NPRS scale: 5-10/10 Pain location: left shoulder Pain description: sharp to dull ache Aggravating factors: reaching overhead, lifting, pulling/pushing Relieving factors: rest, heat  PRECAUTIONS: None  WEIGHT BEARING RESTRICTIONS: No  FALLS:  Has patient fallen in last 6 months? 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  PLOF: [place] my shoulder to keep arthritis from getting worse and be able to get back to normal  NEXT MD VISIT: None scheduled  OBJECTIVE:   DIAGNOSTIC FINDINGS:  MRI moderate tendinosis of supraspinatus and partial tear; mild tendinosis of subscapularis tendin; XR left shoulder nothing significant  PATIENT SURVEYS:  FOTO = 41 at eval  COGNITION: Overall cognitive status: Within functional limits for tasks assessed     SENSATION: Not  tested  POSTURE: Increased thoracic kyphosis and rounded shoulders, forward head  UPPER EXTREMITY ROM:   Active ROM Right eval Left eval  Shoulder flexion 160 128 +Pain  Shoulder extension 54 50 +Pain  Shoulder abduction 140 118 +Pain  Shoulder adduction    Shoulder internal rotation Thumb to bra line Reach to sacrum+Pain  Shoulder external rotation    Elbow flexion    Elbow extension    Wrist flexion    Wrist extension    Wrist ulnar deviation    Wrist radial deviation    Wrist pronation    Wrist supination    (Blank rows = not tested)  UPPER EXTREMITY MMT:  MMT Right eval Left eval  Shoulder flexion 5/5 2/5 P!  Shoulder extension 5/5 5/5  Shoulder abduction 5/5 4/5  Shoulder adduction 5/5 5/5  Shoulder internal rotation 5/5 3/5 P!  Shoulder external rotation 5/5 3/5 P!  Middle trapezius    Lower trapezius    Elbow flexion 5/5 5/5  Elbow extension 5/5 5/5  Wrist flexion    Wrist extension    Wrist ulnar deviation    Wrist radial deviation    Wrist pronation    Wrist supination    Grip strength (lbs)    (Blank rows = not tested)  SHOULDER SPECIAL TESTS: Impingement tests: Neer impingement test: negative, Hawkins/Kennedy impingement test: negative, and Painful arc test: negative (unable to obtain ROM for NEER and painful arc) SLAP lesions: not tested Instability tests:  not test Rotator cuff assessment:  not tested due to pain with ROM   JOINT MOBILITY TESTING:  Thoracic and cervical hypomobility noticed with functional rotation to look Rt/Lt, no increased pain with cervical movements Hypomobility with A/P GH joint mobility  PALPATION:  TTP pecs, subclavious   TODAY'S TREATMENT:  DATE: 12/17/2021  Self care: Educated and performed initial HEP as seen below  PATIENT EDUCATION: Education details: Access Code:  [email or web address] Person educated: Patient Education method: Explanation, Demonstration, Tactile cues, Verbal cues, and Handouts Education comprehension: verbalized understanding and returned demonstration  HOME EXERCISE PROGRAM: Access Code: [email or web address] URL: [email or web address] Date: 12/17/2021 Prepared by: Jari Favre  Exercises - Horizontal Shoulder Pendulum with Table Support  - 1 x daily - 7 x weekly - 3 sets - 10 reps - Isometric Shoulder Abduction with Ball at [place]  - 1 x daily - 7 x weekly - 3 sets - 5 reps - Isometric Shoulder Flexion with Ball at [place]  - 1 x daily - 7 x weekly - 3 sets - 5 reps - Seated Upper Trapezius Stretch  - 1 x daily - 7 x weekly - 3 sets - 10 reps  ASSESSMENT:  CLINICAL IMPRESSION: Patient is a 76 y.o. female who was seen today for physical therapy evaluation and treatment for left shoulder pain.  Pt has limited shoulder flexion and abduction with active and passive ROM.  Very tender to palpation at pectoralis, subclavius, supraspinatus muscles.  Pt has rounded shoulders and stiff thoracic spine with increased kyphosis. Pt has left shoulder weakness and pain as noted above.  After doing AROM shoulder was in elevated pain for the remainder of the evaluation.  She will benefit from skilled PT to address impairments so she can return to maximum function and use of UE.  OBJECTIVE IMPAIRMENTS: decreased ROM, decreased strength, hypomobility, impaired UE functional use, postural dysfunction, and pain.   ACTIVITY LIMITATIONS: carrying, lifting, sleeping, dressing, and reach over head  PARTICIPATION LIMITATIONS: cleaning, driving, and community activity  PERSONAL FACTORS: Age and Time since onset of injury/illness/exacerbation are also affecting patient's functional outcome.   REHAB POTENTIAL: Excellent  CLINICAL DECISION MAKING: Stable/uncomplicated  EVALUATION COMPLEXITY: Low   GOALS: Goals reviewed with patient? Yes  SHORT TERM GOALS:  Target date: 01/14/21  Pt will demonstrate 140 deg left should abduction and flexion without increased pain Baseline: Goal status: INITIAL  2.  Ind with Initial HEP Baseline:  Goal status: INITIAL  3.  Reports 25% less pain in left shoulder with daily use Baseline:  Goal status: INITIAL    LONG TERM GOALS: Target date: 03/11/2022  Pt will be independent with advanced HEP to maintain improvements made throughout therapy and understand how to continue to improve strength for returning to swimming Baseline:  Goal status: INITIAL  2.  Pt will report 75% reduction of pain due to improvements in posture, strength, and joint mobility Baseline:  Goal status: INITIAL  3.  Pt will be able to sleep on her left side without pain throughout the night Baseline:  Goal status: INITIAL  4.  Pt will be able to put her bra off/on without increased pain using both hands due to improved range of motion Baseline:  Goal status: INITIAL  5.  Pt will demonstrate at least 4/5 left shoulder strength for improved ability to lift items overhead Baseline:  Goal status: INITIAL    PLAN:  PT FREQUENCY: 1x/week  PT DURATION: 12 weeks  PLANNED INTERVENTIONS: Therapeutic exercises, Therapeutic activity, Neuromuscular re-education, Balance training, Gait training, Patient/Family education, Self Care, Joint mobilization, Dry Needling, Electrical stimulation, Cryotherapy, Moist heat, Taping, Biofeedback, Manual therapy, and Re-evaluation  PLAN FOR NEXT SESSION: stim for pain management maybe after treatment, gentle GH distraction, AAROM, PROM   Jakki L Mikala Podoll, PT 12/18/2021, 7:58 PM

## 2021-12-19 DIAGNOSIS — F411 Generalized anxiety disorder: Secondary | ICD-10-CM | POA: Diagnosis not present

## 2021-12-19 DIAGNOSIS — E782 Mixed hyperlipidemia: Secondary | ICD-10-CM | POA: Diagnosis not present

## 2021-12-19 DIAGNOSIS — R42 Dizziness and giddiness: Secondary | ICD-10-CM | POA: Diagnosis not present

## 2021-12-19 DIAGNOSIS — Z0000 Encounter for general adult medical examination without abnormal findings: Secondary | ICD-10-CM | POA: Diagnosis not present

## 2021-12-23 NOTE — Progress Notes (Unsigned)
NEUROLOGY FOLLOW UP OFFICE NOTE  Shelly [name] [id number]  Assessment/Plan:   Migraine with aura, without status migrainosus, not intractable   She will consider magnesium citrate 400mg  daily, CoQ10 300mg  daily and riboflavin 400mg  daily  Lifestyle modification Increase hydration Increase exercise Keep headache diary - monitor food intake For migraine rescue:  Ubrelvy 100mg , Zofran for nausea Follow up 4 months     Subjective:  Shelly [name] is a 76 year old female with gluten allergy, who follows up for migraine.  UPDATE: Ubrelvy effective.  Works in 30 minutes.  Only getting 2 migraines a month.   Current NSAIDS/analgesics:  Tylenol Current triptans:  none Current ergotamine:  none Current anti-emetic:  none Current muscle relaxants:  none Current Antihypertensive medications:  none Current Antidepressant medications:  none Current Anticonvulsant medications:  none Current anti-CGRP:  none Current Vitamins/Herbal/Supplements:  none Current Antihistamines/Decongestants:  meclizine 12.5-25mg  Other therapy:  none Hormone/birth control:  none     Caffeine:  2 cups coffee daily Diet:  not enough water.  Ginger ale.  Does not skip meals Exercise:  not routine Depression:  no; Anxiety:  no Sleep hygiene:  good   HISTORY: She started getting episodes of headache with dizziness and sinus symptoms several years ago, but became more frequent around 2020.  She was previously diagnosed with vasomotor rhinitis due to the prominent rhinorrhea.  She describes onset of runny nose with diffuse and right sided severe pressure headache with bilateral facial and teeth pain.  Sometimes visual aura of jagged flashes in the eye.  Associated with nausea, vertigo, bilateral ear pressure, brain fog, trouble focusing eyes.  Lasts 24 hours.  Initially occurring every 9 days.  Treats with Tylenol and sometimes Sudafed and meclizine.  Started increasing Tylenol intake (daily) and now  occurring almost daily.  Triggers include strong smells (air fresheners, Clorox), eye strain and air blowing in face.  Relieving factors include resting in dark room.  Evaluated by neurology, ENT, allergist and ophthalmology with negative workup.  Believed to be migraines.   MRI of brain and IACs with and without contrast on 06/21/2020 personally reviewed was unremarkable.   Past NSAIDS/analgesics:  tramadol Past abortive triptans:  rizatriptan Past abortive ergotamine:  none Past muscle relaxants:  none Past anti-emetic:  Zofran Past antihypertensive medications:  none Past antidepressant medications:  none Past anticonvulsant medications:  none Past anti-CGRP:  Nurtec Past vitamins/Herbal/Supplements:  none Past antihistamines/decongestants:  Zyrtec Other past therapies:  none    Family history of headache:  father.  Other family history:  Sister has ALS  PAST MEDICAL HISTORY: Past Medical History:  Diagnosis Date   Allergic rhinitis    Anxiety    Arthritis    right hand   High cholesterol    Hypotension    Transient gluten sensitivity    diarrhea and stomaCH pains   Vasomotor rhinitis     MEDICATIONS: Current Outpatient Medications on File Prior to Visit  Medication Sig Dispense Refill   acetaminophen (TYLENOL) 500 MG tablet Take 1,000 mg by mouth every 6 (six) hours as needed (headaches.).      calcium carbonate (OSCAL) 1500 (600 Ca) MG TABS tablet 1 tablet with meals     Cholecalciferol 50 MCG (2000 UT) TABS Take by mouth.     EPINEPHrine 0.3 mg/0.3 mL IJ SOAJ injection Inject into the muscle as directed.     meclizine (ANTIVERT) 12.5 MG tablet Take 12.5-25 mg by mouth daily as needed.  ondansetron (ZOFRAN-ODT) 4 MG disintegrating tablet Take 1 tablet (4 mg total) by mouth every 8 (eight) hours as needed for nausea or vomiting. 20 tablet 5   Pediatric Multivitamins-Iron (CHILD CHEWABLE VITAMINS/IRON) chewable tablet Chew 2 tablets by mouth every other day.      Ubrogepant (UBRELVY) 100 MG TABS Take as needed for earliest onset of a migraine. May repeat in two hours max of 2 tabs in 24 hour period 60 tablet 0   VITAMIN B COMPLEX-C PO Take 1 tablet by mouth daily.     No current facility-administered medications on file prior to visit.    ALLERGIES: Allergies  Allergen Reactions   Alpha-Gal Anaphylaxis    Throat swelling.   Atorvastatin Anaphylaxis   Cephalosporins     Throat swollen   Gelatin Anaphylaxis   Meat Extract Anaphylaxis   Alendronate Sodium Nausea And Vomiting   Doxycycline    Gluten Diarrhea and Nausea Only   Gluten Meal Diarrhea   Hyoscyamine Sulfate Nausea Only   Clindamycin Rash   Cortisone Rash   Paxil [Paroxetine] Rash   Penicillins Rash    Did it involve swelling of the face/tongue/throat, SOB, or low BP? No Did it involve sudden or severe rash/hives, skin peeling, or any reaction on the inside of your mouth or nose? Yes Did you need to seek medical attention at a hospital or doctor's office? No When did it last happen? 5 years ago (~2015)  If all above answers are "NO", may proceed with cephalosporin use.    Prednisone Rash   Sulfonamide Derivatives Rash    FAMILY HISTORY: Family History  Problem Relation Age of Onset   Stroke Mother    Prostate cancer Father    Emphysema Father    Other Sister        Corticobasal syndrome   ALS Maternal Uncle       Objective:  Blood pressure 110/71, pulse (!) 56, height 5\' 4"  (1.626 m), weight 139 lb 9.6 oz (63.3 kg), SpO2 98 %. General: No acute distress.  Patient appears well-groomed.     Metta Clines, DO

## 2021-12-24 ENCOUNTER — Encounter: Payer: Self-pay | Admitting: Neurology

## 2021-12-24 ENCOUNTER — Ambulatory Visit: Payer: Medicare Other | Admitting: Neurology

## 2021-12-24 VITALS — BP 110/71 | HR 56 | Ht 64.0 in | Wt 139.6 lb

## 2021-12-24 DIAGNOSIS — G43109 Migraine with aura, not intractable, without status migrainosus: Secondary | ICD-10-CM

## 2021-12-24 NOTE — Patient Instructions (Addendum)
Take Roselyn Meier as needed  Consider:  magnesium citrate 400mg  daily, riboflavin 400mg  daily and coenzyme Q10 300mg  daily

## 2021-12-25 ENCOUNTER — Ambulatory Visit: Payer: Medicare Other

## 2021-12-25 DIAGNOSIS — R293 Abnormal posture: Secondary | ICD-10-CM | POA: Diagnosis not present

## 2021-12-25 DIAGNOSIS — M6281 Muscle weakness (generalized): Secondary | ICD-10-CM

## 2021-12-25 DIAGNOSIS — M25612 Stiffness of left shoulder, not elsewhere classified: Secondary | ICD-10-CM | POA: Diagnosis not present

## 2021-12-25 DIAGNOSIS — M25512 Pain in left shoulder: Secondary | ICD-10-CM

## 2021-12-25 NOTE — Therapy (Signed)
OUTPATIENT PHYSICAL THERAPY SHOULDER EVALUATION   Patient Name: Shelly [name] MRN: [id number] DOB:October 23, 1945, 76 y.o., female Today's Date: 12/25/2021  END OF SESSION:  PT End of Session - 12/17/21 1024     Visit Number 1    Date for PT Re-Evaluation 03/11/22    Authorization Type BCBS    PT Start Time 1021    PT Stop Time 1100    PT Time Calculation (min) 39 min    Activity Tolerance Patient tolerated treatment well    Behavior During Therapy WFL for tasks assessed/performed             Past Medical History:  Diagnosis Date   Allergic rhinitis    Anxiety    Arthritis    right hand   High cholesterol    Hypotension    Transient gluten sensitivity    diarrhea and stomaCH pains   Vasomotor rhinitis    Past Surgical History:  Procedure Laterality Date   EUS N/A 06/28/2013   Procedure: ESOPHAGEAL ENDOSCOPIC ULTRASOUND (EUS) RADIAL;  Surgeon: Arta Silence, MD;  Location: [facility];  Service: Endoscopy;  Laterality: N/A;   GALLBLADDER SURGERY  2005   GANGLION CYST EXCISION Left 2003   TUBAL LIGATION  1981   VIDEO ASSISTED THORACOSCOPY (VATS)/WEDGE RESECTION Right 10/03/2018   Procedure: VIDEO ASSISTED THORACOSCOPY (VATS)/WEDGE RESECTION with multiple node dissection.;  Surgeon: Melrose Nakayama, MD;  Location: [facility] OR;  Service: Thoracic;  Laterality: Right;   Patient Active Problem List   Diagnosis Date Noted   Impingement syndrome of left shoulder 10/08/2021   Vision disturbance 05/28/2020   Flushing 05/28/2020   Mallet finger of left hand 04/23/2020   Trigger finger, right ring finger 04/23/2020   Malignant carcinoid tumor of the bronchus and lung ([place]) 05/15/2019   Nodule of upper lobe of right lung 10/03/2018   Cigarette smoker 09/05/2018   Pulmonary nodules 08/31/2011   Hemoptysis, unspecified 08/31/2011   Anxiety state 02/13/2010   DEPRESSION 02/13/2010   DIARRHEA 02/13/2010   ABDOMINAL PAIN-PERIUMBILICAL 08/65/7846    PCP: London Pepper,  MD   REFERRING PROVIDER: Garald Balding, MD   REFERRING DIAG: [phone] (ICD-10-CM) - Acute pain of left shoulder   THERAPY DIAG:  Acute pain of left shoulder  Stiffness of left shoulder, not elsewhere classified  Muscle weakness (generalized)  Abnormal posture  Rationale for Evaluation and Treatment: Rehabilitation  ONSET DATE: September  SUBJECTIVE:                                                                                                                                                                                      SUBJECTIVE STATEMENT: Patient reports  she is doing "ok".  Pain level 5/10 today.  PERTINENT HISTORY: Right handed  PAIN:  Are you having pain? Yes: NPRS scale: 5-10/10 Pain location: left shoulder Pain description: sharp to dull ache Aggravating factors: reaching overhead, lifting, pulling/pushing Relieving factors: rest, heat  PRECAUTIONS: None  WEIGHT BEARING RESTRICTIONS: No  FALLS:  Has patient fallen in last 6 months? 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  PLOF: [place] my shoulder to keep arthritis from getting worse and be able to get back to normal  NEXT MD VISIT: None scheduled  OBJECTIVE:   DIAGNOSTIC FINDINGS:  MRI moderate tendinosis of supraspinatus and partial tear; mild tendinosis of subscapularis tendin; XR left shoulder nothing significant  PATIENT SURVEYS:  FOTO = 41 at eval  COGNITION: Overall cognitive status: Within functional limits for tasks assessed     SENSATION: Not tested  POSTURE: Increased thoracic kyphosis and rounded shoulders, forward head  UPPER EXTREMITY ROM:   Active ROM Right eval Left eval  Shoulder flexion 160 128 +Pain  Shoulder extension 54 50 +Pain  Shoulder abduction 140 118 +Pain  Shoulder adduction    Shoulder internal  rotation Thumb to bra line Reach to sacrum+Pain  Shoulder external rotation    Elbow flexion    Elbow extension    Wrist flexion    Wrist extension    Wrist ulnar deviation    Wrist radial deviation    Wrist pronation    Wrist supination    (Blank rows = not tested)  UPPER EXTREMITY MMT:  MMT Right eval Left eval  Shoulder flexion 5/5 2/5 P!  Shoulder extension 5/5 5/5  Shoulder abduction 5/5 4/5  Shoulder adduction 5/5 5/5  Shoulder internal rotation 5/5 3/5 P!  Shoulder external rotation 5/5 3/5 P!  Middle trapezius    Lower trapezius    Elbow flexion 5/5 5/5  Elbow extension 5/5 5/5  Wrist flexion    Wrist extension    Wrist ulnar deviation    Wrist radial deviation    Wrist pronation    Wrist supination    Grip strength (lbs)    (Blank rows = not tested)  SHOULDER SPECIAL TESTS: Impingement tests: Neer impingement test: negative, Hawkins/Kennedy impingement test: negative, and Painful arc test: negative (unable to obtain ROM for NEER and painful arc) SLAP lesions: not tested Instability tests:  not test Rotator cuff assessment:  not tested due to pain with ROM   JOINT MOBILITY TESTING:  Thoracic and cervical hypomobility noticed with functional rotation to look Rt/Lt, no increased pain with cervical movements Hypomobility with A/P GH joint mobility  PALPATION:  TTP pecs, subclavious   TODAY'S TREATMENT:  DATE: 12/25/2021 UBE attempted but patient had pain after approx 2 min Pulleys x 2 min each: flexion, scaption and IR  Initiated scapular stabilization:  Prone shoulder ext 0# 2 x 10 left Prone shoulder row 0# 2 x 10 left Prone shoulder abd 0# 2 x 10 left Side lying ER 0# 2 x 10 left Supine Serratus punch 0# 2 x 10 Ice to left shoulder in supine x 10 min post treatment  DATE: 12/17/2021  Self care: Educated and  performed initial HEP as seen below  PATIENT EDUCATION: Education details: Access Code: [email or web address] Person educated: Patient Education method: Consulting civil engineer, Media planner, Corporate treasurer cues, Verbal cues, and Handouts Education comprehension: verbalized understanding and returned demonstration  HOME EXERCISE PROGRAM: Access Code: [email or web address] URL: [email or web address] Date: 12/25/2021 Prepared by: Candyce Churn  Exercises - Horizontal Shoulder Pendulum with Table Support  - 1 x daily - 7 x weekly - 3 sets - 10 reps - Isometric Shoulder Abduction with Ball at [place]  - 1 x daily - 7 x weekly - 3 sets - 5 reps - Isometric Shoulder Flexion with Ball at [employer]  - 1 x daily - 7 x weekly - 3 sets - 5 reps - Seated Upper Trapezius Stretch  - 1 x daily - 7 x weekly - 3 sets - 10 reps - Seated Shoulder Flexion AAROM with Pulley Behind  - 1 x daily - 7 x weekly - 1 sets - 1 reps - 2 min hold - Seated Shoulder Scaption AAROM with Pulley at Side  - 1 x daily - 7 x weekly - 3 sets - 1 reps - 2 min hold - Standing Shoulder Internal Rotation AAROM with Pulley  - 1 x daily - 7 x weekly - 1 sets - 1 reps - Prone Shoulder Extension - Single Arm  - 2 x daily - 7 x weekly - 2 sets - 10 reps - Prone Shoulder Row  - 2 x daily - 7 x weekly - 2 sets - 10 reps - Prone Single Arm Shoulder Horizontal Abduction with Scapular Retraction and Palm Down  - 2 x daily - 7 x weekly - 2 sets - 10 reps - Sidelying Shoulder External Rotation  - 2 x daily - 7 x weekly - 2 sets - 10 reps - Single Arm Serratus Punches in Supine with Dumbbell  - 2 x daily - 7 x weekly - 2 sets - 10 reps  ASSESSMENT:  CLINICAL IMPRESSION: Rylea experienced pain on the UBE and had to stop.  She demonstrates signs of impingement throughout all pulley motions but we had her go to discomfort vs pain to cut down on this.  She was able to do prone scapular stabilization without weight with verbal cues to work in pain free range.  Iced the left  shoulder post op to avoid any exacerbation of pain.  She will benefit from skilled PT to address impairments so she can return to maximum function and use of UE.  OBJECTIVE IMPAIRMENTS: decreased ROM, decreased strength, hypomobility, impaired UE functional use, postural dysfunction, and pain.   ACTIVITY LIMITATIONS: carrying, lifting, sleeping, dressing, and reach over head  PARTICIPATION LIMITATIONS: cleaning, driving, and community activity  PERSONAL FACTORS: Age and Time since onset of injury/illness/exacerbation are also affecting patient's functional outcome.   REHAB POTENTIAL: Excellent  CLINICAL DECISION MAKING: Stable/uncomplicated  EVALUATION COMPLEXITY: Low   GOALS: Goals reviewed with patient? Yes  SHORT TERM GOALS: Target date: 01/14/21  Pt will demonstrate 140 deg left should abduction and  flexion without increased pain Baseline: Goal status: INITIAL  2.  Ind with Initial HEP Baseline:  Goal status: INITIAL  3.  Reports 25% less pain in left shoulder with daily use Baseline:  Goal status: INITIAL    LONG TERM GOALS: Target date: 03/11/2022  Pt will be independent with advanced HEP to maintain improvements made throughout therapy and understand how to continue to improve strength for returning to swimming Baseline:  Goal status: INITIAL  2.  Pt will report 75% reduction of pain due to improvements in posture, strength, and joint mobility Baseline:  Goal status: INITIAL  3.  Pt will be able to sleep on her left side without pain throughout the night Baseline:  Goal status: INITIAL  4.  Pt will be able to put her bra off/on without increased pain using both hands due to improved range of motion Baseline:  Goal status: INITIAL  5.  Pt will demonstrate at least 4/5 left shoulder strength for improved ability to lift items overhead Baseline:  Goal status: INITIAL    PLAN:  PT FREQUENCY: 1x/week  PT DURATION: 12 weeks  PLANNED INTERVENTIONS:  Therapeutic exercises, Therapeutic activity, Neuromuscular re-education, Balance training, Gait training, Patient/Family education, Self Care, Joint mobilization, Dry Needling, Electrical stimulation, Cryotherapy, Moist heat, Taping, Biofeedback, Manual therapy, and Re-evaluation  PLAN FOR NEXT SESSION: UBE if symptoms improved, continue scapular stabilization if pain has improved, possibly add 1# if patient is doing really well,  Ice or stim for pain management maybe after treatment, gentle GH distraction, AAROM, PROM   Tabor Denham B. Landis Dowdy, PT 12/25/21 1:15 PM  [facility] [address], [place], [place] [address] Phone # [phone] Fax [phone]

## 2022-01-07 NOTE — Therapy (Addendum)
OUTPATIENT PHYSICAL THERAPY SHOULDER EVALUATION PHYSICAL THERAPY DISCHARGE SUMMARY  Visits from Start of Care: 1  Current functional level related to goals / functional outcomes: See below- patient did not return   Remaining deficits: See below- patient did not return   Education / Equipment: See below- patient did not return   Patient agrees to discharge. Patient goals were not met. Patient is being discharged due to not returning since the last visit.    Patient Name: Shelly [name] MRN: [id number] DOB:1945-11-29, 77 y.o., female Today's Date: 02/26/2022  END OF SESSION:  PT End of Session - 12/17/21 1024     Visit Number 1    Date for PT Re-Evaluation 03/11/22    Authorization Type BCBS    PT Start Time 1021    PT Stop Time 1100    PT Time Calculation (min) 39 min    Activity Tolerance Patient tolerated treatment well    Behavior During Therapy WFL for tasks assessed/performed             Past Medical History:  Diagnosis Date   Allergic rhinitis    Anxiety    Arthritis    right hand   High cholesterol    Hypotension    Transient gluten sensitivity    diarrhea and stomaCH pains   Vasomotor rhinitis    Past Surgical History:  Procedure Laterality Date   EUS N/A 06/28/2013   Procedure: ESOPHAGEAL ENDOSCOPIC ULTRASOUND (EUS) RADIAL;  Surgeon: Willis Modena, MD;  Location: [facility];  Service: Endoscopy;  Laterality: N/A;   GALLBLADDER SURGERY  2005   GANGLION CYST EXCISION Left 2003   TUBAL LIGATION  1981   VIDEO ASSISTED THORACOSCOPY (VATS)/WEDGE RESECTION Right 10/03/2018   Procedure: VIDEO ASSISTED THORACOSCOPY (VATS)/WEDGE RESECTION with multiple node dissection.;  Surgeon: Loreli Slot, MD;  Location: [facility] OR;  Service: Thoracic;  Laterality: Right;   Patient Active Problem List   Diagnosis Date Noted   Impingement syndrome of left shoulder 10/08/2021   Vision disturbance 05/28/2020   [place] 05/28/2020   Mallet finger of left hand  04/23/2020   Trigger finger, right ring finger 04/23/2020   Malignant carcinoid tumor of the bronchus and lung ([facility]) 05/15/2019   Nodule of upper lobe of right lung 10/03/2018   Cigarette smoker 09/05/2018   Pulmonary nodules 08/31/2011   Hemoptysis, unspecified 08/31/2011   Anxiety state 02/13/2010   DEPRESSION 02/13/2010   DIARRHEA 02/13/2010   ABDOMINAL PAIN-PERIUMBILICAL 02/13/2010    PCP: [name], MD   REFERRING PROVIDER: Valeria Batman, MD   REFERRING DIAG: [phone] (ICD-10-CM) - Acute pain of left shoulder   THERAPY DIAG:  Acute pain of left shoulder  Stiffness of left shoulder, not elsewhere classified  Abnormal posture  Muscle weakness (generalized)  Rationale for Evaluation and Treatment: Rehabilitation  ONSET DATE: September  SUBJECTIVE:  SUBJECTIVE STATEMENT: Patient reports compliance with her HEP, but also reports trying not to move her arm due to increased pain with movement.  Pain level 7/10 today.  PERTINENT HISTORY: Right handed  PAIN:  Are you having pain? Yes: NPRS scale: 5-10/10 Pain location: left shoulder Pain description: sharp to dull ache Aggravating factors: reaching overhead, lifting, pulling/pushing Relieving factors: rest, heat  PRECAUTIONS: None  WEIGHT BEARING RESTRICTIONS: No  FALLS:  Has patient fallen in last 6 months? 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  PLOF: Independent  PATIENT GOALS:strengthen my shoulder to keep arthritis from getting worse and be able to get back to normal  NEXT MD VISIT: None scheduled  OBJECTIVE:   DIAGNOSTIC FINDINGS:  MRI moderate tendinosis of supraspinatus and partial tear; mild  tendinosis of subscapularis tendin; XR left shoulder nothing significant  PATIENT SURVEYS:  FOTO = 41 at eval  COGNITION: Overall cognitive status: Within functional limits for tasks assessed     SENSATION: Not tested  POSTURE: Increased thoracic kyphosis and rounded shoulders, forward head  UPPER EXTREMITY ROM:   Active ROM Right eval Left eval  Shoulder flexion 160 128 +Pain  Shoulder extension 54 50 +Pain  Shoulder abduction 140 118 +Pain  Shoulder adduction    Shoulder internal rotation Thumb to bra line Reach to sacrum+Pain  Shoulder external rotation    Elbow flexion    Elbow extension    Wrist flexion    Wrist extension    Wrist ulnar deviation    Wrist radial deviation    Wrist pronation    Wrist supination    (Blank rows = not tested)  UPPER EXTREMITY MMT:  MMT Right eval Left eval  Shoulder flexion 5/5 2/5 P!  Shoulder extension 5/5 5/5  Shoulder abduction 5/5 4/5  Shoulder adduction 5/5 5/5  Shoulder internal rotation 5/5 3/5 P!  Shoulder external rotation 5/5 3/5 P!  Middle trapezius    Lower trapezius    Elbow flexion 5/5 5/5  Elbow extension 5/5 5/5  Wrist flexion    Wrist extension    Wrist ulnar deviation    Wrist radial deviation    Wrist pronation    Wrist supination    Grip strength (lbs)    (Blank rows = not tested)  SHOULDER SPECIAL TESTS: Impingement tests: Neer impingement test: negative, Hawkins/Kennedy impingement test: negative, and Painful arc test: negative (unable to obtain ROM for NEER and painful arc) SLAP lesions: not tested Instability tests:  not test Rotator cuff assessment:  not tested due to pain with ROM   JOINT MOBILITY TESTING:  Thoracic and cervical hypomobility noticed with functional rotation to look Rt/Lt, no increased pain with cervical movements Hypomobility with A/P GH joint mobility  PALPATION:  TTP pecs, subclavious   TODAY'S TREATMENT:          Date: 01/08/2022: Pulleys x 2 min each: flexion,  scaption and IR  Initiated scapular stabilization:  Prone shoulder ext 0# 2 x 10 left Prone shoulder row 0# 2 x 10 left Prone shoulder abd 0# 2 x 10 left Side lying ER 0# 2 x 10 left Supine Serratus  Scapular retraction 2x10  DATE: 12/25/2021 UBE 2 min (pt reports increased pain so stopped).  Pulleys x 2 min each: flexion, scaption and IR  Initiated scapular stabilization:  Prone shoulder ext 0# 2 x 10 left Prone shoulder row 0# 2 x 10 left Prone shoulder abd 0# 2 x 10 left Side lying ER 0# 2 x 10 left Supine Serratus  ppunch 0# 2 x 10 Ice to left shoulder in supine x 10 min post treatment  DATE: 12/17/2021  Self care: Educated and performed initial HEP as seen below  PATIENT EDUCATION: Education details: Access Code: [email or web address] Person educated: Patient Education method: Programmer, multimedia, Facilities manager, Actor cues, Verbal cues, and Handouts Education comprehension: verbalized understanding and returned demonstration  HOME EXERCISE PROGRAM: Access Code: [email or web address] URL: [email or web address] Date: 12/25/2021 Prepared by: Mikey Kirschner  Exercises - Horizontal Shoulder Pendulum with Table Support  - 1 x daily - 7 x weekly - 3 sets - 10 reps - Isometric Shoulder Abduction with Ball at [place]  - 1 x daily - 7 x weekly - 3 sets - 5 reps - Isometric Shoulder Flexion with Ball at [employer]  - 1 x daily - 7 x weekly - 3 sets - 5 reps - Seated Upper Trapezius Stretch  - 1 x daily - 7 x weekly - 3 sets - 10 reps - Seated Shoulder Flexion AAROM with Pulley Behind  - 1 x daily - 7 x weekly - 1 sets - 1 reps - 2 min hold - Seated Shoulder Scaption AAROM with Pulley at Side  - 1 x daily - 7 x weekly - 3 sets - 1 reps - 2 min hold - Standing Shoulder Internal Rotation AAROM with Pulley  - 1 x daily - 7 x weekly - 1 sets - 1 reps - Prone Shoulder Extension - Single Arm  - 2 x  daily - 7 x weekly - 2 sets - 10 reps - Prone Shoulder Row  - 2 x daily - 7 x weekly - 2 sets - 10 reps - Prone Single Arm Shoulder Horizontal Abduction with Scapular Retraction and Palm Down  - 2 x daily - 7 x weekly - 2 sets - 10 reps - Sidelying Shoulder External Rotation  - 2 x daily - 7 x weekly - 2 sets - 10 reps - Single Arm Serratus Punches in Supine with Dumbbell  - 2 x daily - 7 x weekly - 2 sets - 10 reps  ASSESSMENT:  CLINICAL IMPRESSION: Ezmeralda denied trying the UBE today due to past experience. She reports tolerable pain with exercises today and was able to push through session with distraction. Pt reports continued pain at end of session, but no increase above 7/10. Encouraged pt to move shoulder in pain free range at home. She will benefit from skilled PT to address impairments so she can return to maximum function and use of UE.  OBJECTIVE IMPAIRMENTS: decreased ROM, decreased strength, hypomobility, impaired UE functional use, postural dysfunction, and pain.   ACTIVITY LIMITATIONS: carrying, lifting, sleeping, dressing, and reach over head  PARTICIPATION LIMITATIONS: cleaning, driving, and community activity  PERSONAL FACTORS: Age and Time since onset of injury/illness/exacerbation are also affecting patient's functional outcome.   REHAB POTENTIAL: Excellent  CLINICAL DECISION MAKING: Stable/uncomplicated  EVALUATION COMPLEXITY: Low   GOALS: Goals reviewed with patient? Yes  SHORT TERM GOALS: Target date: 01/14/21  Pt will demonstrate 140 deg left should abduction and flexion without increased pain Baseline: Goal status: INITIAL  2.  Ind with Initial HEP Baseline:  Goal  status: INITIAL  3.  Reports 25% less pain in left shoulder with daily use Baseline:  Goal status: INITIAL    LONG TERM GOALS: Target date: 03/11/2022  Pt will be independent with advanced HEP to maintain improvements made throughout therapy and understand how to continue to improve  strength for returning to swimming Baseline:  Goal status: INITIAL  2.  Pt will report 75% reduction of pain due to improvements in posture, strength, and joint mobility Baseline:  Goal status: INITIAL  3.  Pt will be able to sleep on her left side without pain throughout the night Baseline:  Goal status: INITIAL  4.  Pt will be able to put her bra off/on without increased pain using both hands due to improved range of motion Baseline:  Goal status: INITIAL  5.  Pt will demonstrate at least 4/5 left shoulder strength for improved ability to lift items overhead Baseline:  Goal status: INITIAL    PLAN:  PT FREQUENCY: 1x/week  PT DURATION: 12 weeks  PLANNED INTERVENTIONS: Therapeutic exercises, Therapeutic activity, Neuromuscular re-education, Balance training, Gait training, Patient/Family education, Self Care, Joint mobilization, Dry Needling, Electrical stimulation, Cryotherapy, Moist heat, Taping, Biofeedback, Manual therapy, and Re-evaluation  PLAN FOR NEXT SESSION: UBE if symptoms improved, continue scapular stabilization if pain has improved, possibly add 1# if patient is doing really well,  Ice or stim for pain management maybe after treatment, gentle GH distraction, AAROM, PROM   Jennifer B. Fields, PT 02/26/22 10:53 AM

## 2022-01-08 ENCOUNTER — Ambulatory Visit: Payer: BLUE CROSS/BLUE SHIELD | Attending: Orthopaedic Surgery | Admitting: Physical Therapy

## 2022-01-08 DIAGNOSIS — M25612 Stiffness of left shoulder, not elsewhere classified: Secondary | ICD-10-CM

## 2022-01-08 DIAGNOSIS — R293 Abnormal posture: Secondary | ICD-10-CM

## 2022-01-08 DIAGNOSIS — M6281 Muscle weakness (generalized): Secondary | ICD-10-CM | POA: Diagnosis not present

## 2022-01-08 DIAGNOSIS — M25512 Pain in left shoulder: Secondary | ICD-10-CM

## 2022-01-16 DIAGNOSIS — M8589 Other specified disorders of bone density and structure, multiple sites: Secondary | ICD-10-CM | POA: Diagnosis not present

## 2022-01-16 DIAGNOSIS — M810 Age-related osteoporosis without current pathological fracture: Secondary | ICD-10-CM | POA: Diagnosis not present

## 2022-02-23 DIAGNOSIS — I700 Atherosclerosis of aorta: Secondary | ICD-10-CM | POA: Diagnosis not present

## 2022-02-23 DIAGNOSIS — R109 Unspecified abdominal pain: Secondary | ICD-10-CM | POA: Diagnosis not present

## 2022-02-23 DIAGNOSIS — R197 Diarrhea, unspecified: Secondary | ICD-10-CM | POA: Diagnosis not present

## 2022-02-23 DIAGNOSIS — R110 Nausea: Secondary | ICD-10-CM | POA: Diagnosis not present

## 2022-02-26 DIAGNOSIS — R197 Diarrhea, unspecified: Secondary | ICD-10-CM | POA: Diagnosis not present

## 2022-03-23 ENCOUNTER — Telehealth: Payer: Self-pay | Admitting: Internal Medicine

## 2022-03-23 NOTE — Telephone Encounter (Signed)
Per 3/18 IB reached out to patient.

## 2022-03-30 ENCOUNTER — Other Ambulatory Visit: Payer: Self-pay | Admitting: Neurology

## 2022-03-30 DIAGNOSIS — G43109 Migraine with aura, not intractable, without status migrainosus: Secondary | ICD-10-CM

## 2022-04-07 ENCOUNTER — Other Ambulatory Visit: Payer: Self-pay | Admitting: Internal Medicine

## 2022-04-07 DIAGNOSIS — C3490 Malignant neoplasm of unspecified part of unspecified bronchus or lung: Secondary | ICD-10-CM

## 2022-06-17 ENCOUNTER — Other Ambulatory Visit: Payer: Self-pay | Admitting: Neurology

## 2022-06-17 DIAGNOSIS — G43109 Migraine with aura, not intractable, without status migrainosus: Secondary | ICD-10-CM

## 2022-06-18 ENCOUNTER — Ambulatory Visit: Payer: Medicare Other | Admitting: Orthopaedic Surgery

## 2022-06-18 ENCOUNTER — Other Ambulatory Visit (INDEPENDENT_AMBULATORY_CARE_PROVIDER_SITE_OTHER): Payer: Medicare Other

## 2022-06-18 ENCOUNTER — Encounter: Payer: Self-pay | Admitting: Orthopaedic Surgery

## 2022-06-18 DIAGNOSIS — R2231 Localized swelling, mass and lump, right upper limb: Secondary | ICD-10-CM

## 2022-06-18 NOTE — Progress Notes (Signed)
Office Visit Note   Patient: Shelly [name]           Date of Birth: 1945-07-03           MRN: [id number] Visit Date: 06/18/2022              Requested by: [name], MD [address] [place],  [place] [address] PCP: [name], MD   Assessment & Plan: Visit Diagnoses:  1. Mass of finger of right hand     Plan: Impression 77 year old female with right hand mass with increase in size.  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  We will need a new MRI to assess the mass.  We will also obtain arthritis panel.  Follow-up after the MRI.  Follow-Up Instructions: No follow-ups on file.   Orders:  Orders Placed This Encounter  Procedures   XR Hand Complete Right   No orders of the defined types were placed in this encounter.     Procedures: No procedures performed   Clinical Data: No additional findings.   Subjective: Chief Complaint  Patient presents with   Right Hand - Pain    HPI Shelly [name] is a pleasant 77 year old female former patient of Dr. Cleophas Dunker who comes in for evaluation of enlarging mass overlying the third MCP joint.  She has noticed this for the last 2 months.  Pain is mild.  Denies any injuries.  She thinks that she may have rheumatoid arthritis but not exactly sure.  She is right-hand dominant.  This was evaluated back in 2016 with an MRI. Review of Systems  Constitutional: Negative.   HENT: Negative.    Eyes: Negative.   Respiratory: Negative.    Cardiovascular: Negative.   Endocrine: Negative.   Musculoskeletal: Negative.   Neurological: Negative.   Hematological: Negative.   Psychiatric/Behavioral: Negative.    All other systems reviewed and are negative.    Objective: Vital Signs: There were no vitals taken for this visit.  Physical Exam Vitals and nursing note reviewed.  Constitutional:      Appearance: She is well-developed.  HENT:     Head: Normocephalic and  atraumatic.  Pulmonary:     Effort: Pulmonary effort is normal.  Abdominal:     Palpations: Abdomen is soft.  Musculoskeletal:     Cervical back: Neck supple.  Skin:    General: Skin is warm.     Capillary Refill: Capillary refill takes less than 2 seconds.  Neurological:     Mental Status: She is alert and oriented to person, place, and time.  Psychiatric:        Behavior: Behavior normal.        Thought Content: Thought content normal.        Judgment: Judgment normal.     Ortho Exam Examination of the right hand shows a firm nonmobile mass over the dorsal aspect of the third MCP joint.  She has full arc of motion of the long finger.  No neurovascular compromise. Specialty Comments:  EXAM: MRI LUMBAR SPINE WITHOUT CONTRAST   TECHNIQUE: Multiplanar, multisequence MR imaging of the lumbar spine was performed. No intravenous contrast was administered.   COMPARISON:  Lumbar spine MRI 08/17/2014   FINDINGS: Segmentation:  Standard.   Alignment:  Trace facet mediated anterolisthesis of L4 on L5.   Vertebrae: No fracture, suspicious marrow lesion, or evidence of discitis.   Conus medullaris and cauda equina: Conus extends to the lower L1  level. Conus and cauda equina appear normal.   Paraspinal and other soft tissues: Bilateral renal cysts including a 3.8 cm cyst on the right with no routine follow-up imaging recommended.   Disc levels:   Disc desiccation throughout the lumbar spine.   T12-L1: Negative.   L1-2: Decreased size of a small central disc protrusion with annular fissure. No stenosis.   L2-3: Mild circumferential disc bulging, overall slightly regressed from the prior MRI. Mild facet hypertrophy. No significant stenosis.   L3-4: Mild disc bulging, small left foraminal disc protrusion, and mild-to-moderate facet and ligamentum flavum hypertrophy result in new mild spinal stenosis and mild left lateral recess stenosis without neural foraminal  stenosis.   L4-5: Anterolisthesis with left eccentric bulging of uncovered disc, moderate right and severe left facet and ligamentum flavum hypertrophy, and a 4 mm synovial cyst at the anterosuperior aspect of the left facet joint result in moderate spinal stenosis and mild bilateral lateral recess stenosis without significant neural foraminal stenosis. Spinal and left lateral recess stenosis have improved compared to the prior MRI. There are small facet joint effusions.   L5-S1: Mild disc bulging and moderate to severe facet hypertrophy without stenosis, not significantly changed. Small right facet joint effusion.   IMPRESSION: 1. New mild spinal stenosis and mild left lateral recess stenosis at L3-4. 2. Moderate spinal stenosis at L4-5, improved from 2016.     Electronically Signed   By: Sebastian Ache M.D.   On: 08/29/2021 13:05  Imaging: No results found.   PMFS History: Patient Active Problem List   Diagnosis Date Noted   Impingement syndrome of left shoulder 10/08/2021   Vision disturbance 05/28/2020   Flushing 05/28/2020   Mallet finger of left hand 04/23/2020   Trigger finger, right ring finger 04/23/2020   Malignant carcinoid tumor of the bronchus and lung (HCC) 05/15/2019   Nodule of upper lobe of right lung 10/03/2018   Cigarette smoker 09/05/2018   Pulmonary nodules 08/31/2011   Hemoptysis, unspecified 08/31/2011   Anxiety state 02/13/2010   DEPRESSION 02/13/2010   DIARRHEA 02/13/2010   ABDOMINAL PAIN-PERIUMBILICAL 02/13/2010   Past Medical History:  Diagnosis Date   Allergic rhinitis    Anxiety    Arthritis    right hand   High cholesterol    Hypotension    Transient gluten sensitivity    diarrhea and stomaCH pains   Vasomotor rhinitis     Family History  Problem Relation Age of Onset   Stroke Mother    Prostate cancer Father    Emphysema Father    Other Sister        Corticobasal syndrome   ALS Maternal Uncle     Past Surgical History:   Procedure Laterality Date   EUS N/A 06/28/2013   Procedure: ESOPHAGEAL ENDOSCOPIC ULTRASOUND (EUS) RADIAL;  Surgeon: Willis Modena, MD;  Location: [facility];  Service: Endoscopy;  Laterality: N/A;   GALLBLADDER SURGERY  2005   GANGLION CYST EXCISION Left 2003   TUBAL LIGATION  1981   VIDEO ASSISTED THORACOSCOPY (VATS)/WEDGE RESECTION Right 10/03/2018   Procedure: VIDEO ASSISTED THORACOSCOPY (VATS)/WEDGE RESECTION with multiple node dissection.;  Surgeon: Loreli Slot, MD;  Location: [facility] OR;  Service: Thoracic;  Laterality: Right;   Social History   Occupational History   Occupation: Retired  Tobacco Use   Smoking status: Former    Packs/day: 0.25    Years: 57.00    Additional pack years: 0.00    Total pack years: 14.25    Types:  Cigarettes    Quit date: 09/29/2018    Years since quitting: 3.7   Smokeless tobacco: Never  Vaping Use   Vaping Use: Never used  Substance and Sexual Activity   Alcohol use: No   Drug use: No   Sexual activity: Not on file

## 2022-06-20 LAB — SEDIMENTATION RATE: Sed Rate: 14 mm/h (ref 0–30)

## 2022-06-20 LAB — ANTI-NUCLEAR AB-TITER (ANA TITER): ANA Titer 1: 1:320 {titer} — ABNORMAL HIGH

## 2022-06-20 LAB — RHEUMATOID FACTOR: Rheumatoid fact SerPl-aCnc: <10 IU/mL (ref <14)

## 2022-06-20 LAB — URIC ACID: Uric Acid, Serum: 5.6 mg/dL (ref 2.5–7.0)

## 2022-06-20 LAB — ANA: Anti Nuclear Antibody (ANA): POSITIVE — AB

## 2022-06-22 ENCOUNTER — Other Ambulatory Visit: Payer: Self-pay

## 2022-06-22 ENCOUNTER — Inpatient Hospital Stay: Payer: Medicare Other | Attending: Internal Medicine

## 2022-06-22 ENCOUNTER — Ambulatory Visit (HOSPITAL_COMMUNITY)
Admission: RE | Admit: 2022-06-22 | Discharge: 2022-06-22 | Disposition: A | Discharge status: Home / Self Care | Payer: Medicare Other | Source: Ambulatory Visit | Attending: Internal Medicine | Admitting: Internal Medicine

## 2022-06-22 DIAGNOSIS — C3490 Malignant neoplasm of unspecified part of unspecified bronchus or lung: Secondary | ICD-10-CM | POA: Insufficient documentation

## 2022-06-22 DIAGNOSIS — R918 Other nonspecific abnormal finding of lung field: Secondary | ICD-10-CM | POA: Insufficient documentation

## 2022-06-22 DIAGNOSIS — Z85110 Personal history of malignant carcinoid tumor of bronchus and lung: Secondary | ICD-10-CM | POA: Insufficient documentation

## 2022-06-22 LAB — CBC WITH DIFFERENTIAL (CANCER CENTER ONLY)
Abs Immature Granulocytes: 0.04 10*3/uL (ref 0.00–0.07)
Basophils Absolute: 0.1 10*3/uL (ref 0.0–0.1)
Basophils Relative: 1 %
Eosinophils Absolute: 0.1 10*3/uL (ref 0.0–0.5)
Eosinophils Relative: 2 %
HCT: 38.3 % (ref 36.0–46.0)
Hemoglobin: 12.9 g/dL (ref 12.0–15.0)
Immature Granulocytes: 1 %
Lymphocytes Relative: 32 %
Lymphs Abs: 2 10*3/uL (ref 0.7–4.0)
MCH: 31.2 pg (ref 26.0–34.0)
MCHC: 33.7 g/dL (ref 30.0–36.0)
MCV: 92.7 fL (ref 80.0–100.0)
Monocytes Absolute: 0.4 10*3/uL (ref 0.1–1.0)
Monocytes Relative: 6 %
Neutro Abs: 3.8 10*3/uL (ref 1.7–7.7)
Neutrophils Relative %: 58 %
Platelet Count: 374 10*3/uL (ref 150–400)
RBC: 4.13 MIL/uL (ref 3.87–5.11)
RDW: 12.5 % (ref 11.5–15.5)
WBC Count: 6.4 10*3/uL (ref 4.0–10.5)
nRBC: 0 % (ref 0.0–0.2)

## 2022-06-22 LAB — CMP (CANCER CENTER ONLY)
ALT: 13 U/L (ref 0–44)
AST: 19 U/L (ref 15–41)
Albumin: 4.4 g/dL (ref 3.5–5.0)
Alkaline Phosphatase: 69 U/L (ref 38–126)
Anion gap: 10 (ref 5–15)
BUN: 10 mg/dL (ref 8–23)
CO2: 27 mmol/L (ref 22–32)
Calcium: 9.6 mg/dL (ref 8.9–10.3)
Chloride: 102 mmol/L (ref 98–111)
Creatinine: 0.81 mg/dL (ref 0.44–1.00)
GFR, Estimated: >60 mL/min (ref >60)
Glucose, Bld: 95 mg/dL (ref 70–99)
Potassium: 3.6 mmol/L (ref 3.5–5.1)
Sodium: 139 mmol/L (ref 135–145)
Total Bilirubin: 0.4 mg/dL (ref 0.3–1.2)
Total Protein: 7.4 g/dL (ref 6.5–8.1)

## 2022-06-22 MED ORDER — IOHEXOL 300 MG/ML  SOLN
75.0000 mL | Freq: Once | INTRAMUSCULAR | Status: AC | PRN
  Administered 2022-06-22: 75 mL via INTRAVENOUS

## 2022-06-22 MED ORDER — SODIUM CHLORIDE (PF) 0.9 % IJ SOLN
INTRAMUSCULAR | Status: AC
  Filled 2022-06-22: qty 50

## 2022-06-23 ENCOUNTER — Inpatient Hospital Stay: Payer: Medicare Other | Admitting: Internal Medicine

## 2022-06-25 DIAGNOSIS — Z1231 Encounter for screening mammogram for malignant neoplasm of breast: Secondary | ICD-10-CM | POA: Diagnosis not present

## 2022-06-26 NOTE — Progress Notes (Unsigned)
[facility] OFFICE PROGRESS NOTE  [name], MD [address] [place] [address]  DIAGNOSIS: Stage IA (T1b, N0, M0) typical carcinoid tumor. The patient has multiple other pulmonary nodules that need close monitoring and observation.   PRIOR THERAPY: Status post wedge resection of right upper lobectomy with lymph node sampling under the care of Dr. Dorris Fetch on 10/03/2018.   CURRENT THERAPY: Observation   INTERVAL HISTORY: Shelly [name] 77 y.o. female returns to the clinic today for follow-up visit accompanied by her friend. The patient is currently on observation for her history of stage I typical carcinoid tumor.  Although she has multiple other pulmonary nodules that we are monitoring closely.  She is feeling well today without any major changes in her health.  She denies any recent fever, chills, or unexplained weight loss. She has night sweats intermittently. Denies any chest pain, shortness of breath, cough, or hemoptysis. Denies any nausea, vomiting, diarrhea, or constipation.  She denies any headache or visual changes except she does periodically struggle with migraines for which she follows with neurology.  She is on a new medication for migraines which has helped push her migraines out to be a little less frequent. She is also been seeing orthopedics recently and is undergoing workup for possible autoimmune disorder. She is expected to follow up with her doctor to review this results next week. She denies any recent upper respiratory infections. She recently had a restaging CT scan performed.  She is here today for evaluation and repeat blood work and to review her scan results.  MEDICAL HISTORY: Past Medical History:  Diagnosis Date   Allergic rhinitis    Anxiety    Arthritis    right hand   High cholesterol    Hypotension    Transient gluten sensitivity    diarrhea and stomaCH pains   Vasomotor rhinitis     ALLERGIES:  is allergic  to alpha-gal, atorvastatin, cephalosporins, gelatin, meat extract, alendronate sodium, doxycycline, gluten, gluten meal, hyoscyamine sulfate, clindamycin, cortisone, paxil [paroxetine], penicillins, prednisone, and sulfonamide derivatives.  MEDICATIONS:  Current Outpatient Medications  Medication Sig Dispense Refill   acetaminophen (TYLENOL) 500 MG tablet Take 1,000 mg by mouth every 6 (six) hours as needed (headaches.).      EPINEPHrine 0.3 mg/0.3 mL IJ SOAJ injection Inject into the muscle as directed.     meclizine (ANTIVERT) 12.5 MG tablet Take 12.5-25 mg by mouth daily as needed.     ondansetron (ZOFRAN-ODT) 4 MG disintegrating tablet Take 1 tablet (4 mg total) by mouth every 8 (eight) hours as needed for nausea or vomiting. 20 tablet 5   Pediatric Multivitamins-Iron (CHILD CHEWABLE VITAMINS/IRON) chewable tablet Chew 2 tablets by mouth every other day.     Ubrogepant (UBRELVY) 100 MG TABS TAKE 1 TABLET BY MOUTH ONCE AS NEEDED FOR EARLIEST ONSET OF MIGRAINE. MAY REPEAT IN 2 HOURS. MAX OF 2 TABLETS IN A 24 HOUR PERIOD 16 tablet 11   VITAMIN B COMPLEX-C PO Take 1 tablet by mouth daily.     Cholecalciferol 50 MCG (2000 UT) TABS Take by mouth. (Patient not taking: Reported on 06/29/2022)     colestipol (COLESTID) 1 g tablet Take 2 g by mouth daily. (Patient not taking: Reported on 06/29/2022)     rizatriptan (MAXALT) 5 MG tablet Take 5 mg by mouth as needed for migraine. (Patient not taking: Reported on 06/29/2022)     No current facility-administered medications for this visit.    SURGICAL  HISTORY:  Past Surgical History:  Procedure Laterality Date   EUS N/A 06/28/2013   Procedure: ESOPHAGEAL ENDOSCOPIC ULTRASOUND (EUS) RADIAL;  Surgeon: Willis Modena, MD;  Location: [facility];  Service: Endoscopy;  Laterality: N/A;   GALLBLADDER SURGERY  2005   GANGLION CYST EXCISION Left 2003   TUBAL LIGATION  1981   VIDEO ASSISTED THORACOSCOPY (VATS)/WEDGE RESECTION Right 10/03/2018   Procedure: VIDEO  ASSISTED THORACOSCOPY (VATS)/WEDGE RESECTION with multiple node dissection.;  Surgeon: Loreli Slot, MD;  Location: [facility] OR;  Service: Thoracic;  Laterality: Right;    REVIEW OF SYSTEMS:   Review of Systems  Constitutional: Negative for appetite change, chills, fatigue, fever and unexpected weight change.  HENT: Negative for mouth sores, nosebleeds, sore throat and trouble swallowing.   Eyes: Negative for eye problems and icterus.  Respiratory: Negative for cough, hemoptysis, shortness of breath and wheezing.   Cardiovascular: Negative for chest pain and leg swelling.  Gastrointestinal: Negative for abdominal pain, constipation, diarrhea, nausea and vomiting.  Genitourinary: Negative for bladder incontinence, difficulty urinating, dysuria, frequency and hematuria.   Musculoskeletal: Positive for joint stiffness in the hands. Negative for back pain, gait problem, neck pain and neck stiffness.  Skin: Positive for mild small skin pigmentation of the upper abdomen. Negative for itching and rash.  Neurological:Positive for chronic migraines. Negative for dizziness, extremity weakness, gait problem, headaches, light-headedness and seizures.  Hematological: Negative for adenopathy. Does not bruise/bleed easily.  Psychiatric/Behavioral: Negative for confusion, depression and sleep disturbance. The patient is not nervous/anxious.     PHYSICAL EXAMINATION:  Blood pressure 121/61, pulse 77, temperature 98.1 F (36.7 C), temperature source Oral, resp. rate 17, weight 142 lb 8 oz (64.6 kg), SpO2 98 %.  ECOG PERFORMANCE STATUS: 1  Physical Exam  Constitutional: Oriented to person, place, and time and well-developed, well-nourished, and in no distress.  HENT:  Head: Normocephalic and atraumatic.  Mouth/Throat: Oropharynx is clear and moist. No oropharyngeal exudate.  Eyes: Conjunctivae are normal. Right eye exhibits no discharge. Left eye exhibits no discharge. No scleral icterus.  Neck:  Normal range of motion. Neck supple.  Cardiovascular: Normal rate, regular rhythm, normal heart sounds and intact distal pulses.   Pulmonary/Chest: Effort normal and breath sounds normal. No respiratory distress. No wheezes. No rales.  Abdominal: Soft. Bowel sounds are normal. Exhibits no distension and no mass. There is no tenderness.  Musculoskeletal: Normal range of motion. Exhibits no edema.  Lymphadenopathy:    No cervical adenopathy.  Neurological: Alert and oriented to person, place, and time. Exhibits normal muscle tone. Gait normal. Coordination normal.  Skin: Mild pigmented skin lesion on upper abdomen. Skin is warm and dry. No rash noted. Not diaphoretic. No erythema. No pallor.  Psychiatric: Mood, memory and judgment normal.  Vitals reviewed.  LABORATORY DATA: Lab Results  Component Value Date   WBC 6.4 06/22/2022   HGB 12.9 06/22/2022   HCT 38.3 06/22/2022   MCV 92.7 06/22/2022   PLT 374 06/22/2022      Chemistry      Component Value Date/Time   NA 139 06/22/2022 1403   K 3.6 06/22/2022 1403   CL 102 06/22/2022 1403   CO2 27 06/22/2022 1403   BUN 10 06/22/2022 1403   CREATININE 0.81 06/22/2022 1403      Component Value Date/Time   CALCIUM 9.6 06/22/2022 1403   ALKPHOS 69 06/22/2022 1403   AST 19 06/22/2022 1403   ALT 13 06/22/2022 1403   BILITOT 0.4 06/22/2022 1403  RADIOGRAPHIC STUDIES:  CT Chest W Contrast  Result Date: 06/26/2022 CLINICAL DATA:  Non-small cell lung cancer staging; * Tracking Code: BO * EXAM: CT CHEST WITH CONTRAST TECHNIQUE: Multidetector CT imaging of the chest was performed during intravenous contrast administration. RADIATION DOSE REDUCTION: This exam was performed according to the departmental dose-optimization program which includes automated exposure control, adjustment of the mA and/or kV according to patient size and/or use of iterative reconstruction technique. CONTRAST:  75mL OMNIPAQUE IOHEXOL 300 MG/ML  SOLN COMPARISON:   Chest CT dated June 13, 2021 FINDINGS: Cardiovascular: Normal heart size. No pericardial effusion. Normal caliber thoracic aorta with moderate atherosclerotic disease. Mitral annular calcifications. Mediastinum/Nodes: Esophagus and thyroid are unremarkable. Stable prominent right paratracheal lymph node measuring 7 mm on series 2, image 30. No pathologically enlarged lymph nodes seen in the chest. Lungs/Pleura: Central airways are patent. Stable postsurgical findings of right upper lobe segmentectomy. New bilateral branching nodular consolidations, largest is in the right middle lobe on series 8, image 66. Previously seen solid nodules are stable. Reference nodule of the right lower lobe measuring 5 mm on series 8, image 37. No pleural effusion. Upper Abdomen: Prior cholecystectomy.  No acute abnormality. Musculoskeletal: No chest wall abnormality. No acute or significant osseous findings. IMPRESSION: 1. New bilateral branching nodular consolidations, largest is in the right middle lobe, likely infectious/inflammatory. Short-term follow-up chest CT is recommended in 3 months to ensure resolution. 2. Stable postsurgical findings of right upper lobe segmentectomy. No evidence of recurrent or metastatic disease. 3. Aortic Atherosclerosis (ICD10-I70.0). Electronically Signed   By: Allegra Lai M.D.   On: 06/26/2022 16:44   XR Hand Complete Right  Result Date: 06/18/2022 Three-view x-rays show degenerative changes to the third MCP joint with joint space narrowing and osteophytosis of the metacarpal head.  Appears that there is some radial subluxation of the proximal phalanx.  This appears to be chronic in nature.    ASSESSMENT/PLAN:  This is a very pleasant 77 year old Caucasian female with stage I carcinoid tumor of the lung.  She was diagnosed in 2020.  She is status post wedge resection of the right upper lobe with lymph node sampling which was performed in September 2020 under the care of Dr.  Dorris Fetch.  She is currently on observation and feeling well.  The patient recently had a restaging CT scan performed.  The patient was seen with Dr. Arbutus Ped today.  Dr. Arbutus Ped personally and independently reviewed the scan and discussed results with the patient today.  The scan showed new bilateral branching nodular consolidations the largest in the right middle lobe which could be infectious and inflammatory.  Dr. Arbutus Ped recommends to treat her with antibiotics.  The patient has several antibiotic allergies.  She is does tolerate azithromycin.  Therefore we will send her prescription for azithromycin.  We will then follow-up with a repeat CT scan in 3 months to assess for resolution.  Patient is asymptomatic for any infection or upper respiratory infection at this time.  She will follow-up with her orthopedic provider next week to review the results of her ANA.  The patient was advised to call immediately if she has any concerning symptoms in the interval. The patient voices understanding of current disease status and treatment options and is in agreement with the current care plan. All questions were answered. The patient knows to call the clinic with any problems, questions or concerns. We can certainly see the patient much sooner if necessary   No orders of the defined types  were placed in this encounter.    Eirene Rather L Juliann Olesky, PA-C 06/29/22  ADDENDUM: Hematology/Oncology Attending: I had a face-to-face encounter with the patient today.  I reviewed her records, lab, scan and recommended her care plan.  This is a very pleasant 77 years old white female with a stage Ia typical carcinoid tumor in addition to multiple other pulmonary nodules that need close monitoring and observation.  She is status post wedge resection of the right upper lobe in September 2020 and has been in observation since that time.  The patient has been doing fine with no concerning complaints except for  recent mild cough. She had repeat CT scan of the chest performed recently.  I personally and independently reviewed the scan images and discussed the result with the patient and her husband.  Her scan showed new bilateral branching nodular consolidations the largest is in the right middle lobe and likely infectious/inflammatory but short-term follow-up was recommended. I discussed with the patient her scan and recommended for her to have repeat CT scan of the chest in 3 months but in the interval we will treat her with a course of antibiotics.  Unfortunately she has hypersensitivity reaction to multiple antibiotics and the only option left for [place] is to treat her with Z-Pak. She was advised to call immediately if she has any other concerning symptoms in the interval. The total time spent in the appointment was 30 minutes. Disclaimer: This note was dictated with voice recognition software. Similar sounding words can inadvertently be transcribed and may be missed upon review. Lajuana Matte, MD

## 2022-06-29 ENCOUNTER — Inpatient Hospital Stay: Payer: Medicare Other | Admitting: Physician Assistant

## 2022-06-29 ENCOUNTER — Other Ambulatory Visit: Payer: Self-pay

## 2022-06-29 VITALS — BP 121/61 | HR 77 | Temp 98.1°F | Resp 17 | Wt 142.5 lb

## 2022-06-29 DIAGNOSIS — R918 Other nonspecific abnormal finding of lung field: Secondary | ICD-10-CM | POA: Diagnosis not present

## 2022-06-29 DIAGNOSIS — C7A090 Malignant carcinoid tumor of the bronchus and lung: Secondary | ICD-10-CM

## 2022-06-29 DIAGNOSIS — Z85110 Personal history of malignant carcinoid tumor of bronchus and lung: Secondary | ICD-10-CM | POA: Diagnosis not present

## 2022-06-29 MED ORDER — AZITHROMYCIN 250 MG PO TABS
ORAL_TABLET | ORAL | 0 refills | Status: DC
Start: 2022-06-29 — End: 2022-06-30

## 2022-06-29 NOTE — Progress Notes (Unsigned)
NEUROLOGY FOLLOW UP OFFICE NOTE  GIAVANA [name] [id number]  Assessment/Plan:   Migraine with aura, without status migrainosus, not intractable   Start nortriptyline 10mg  at bedtime.  Increase to 25mg  at bedtime in 4-6 weeks if needed For migraine rescue:  Ubrelvy 100mg , Zofran for nausea.  May use Tylenol or Motrin but limit to no more than 2 days out of week Follow up 6 months     Subjective:  Shelly [name] is a 77 year old female with gluten allergy, who follows up for migraine.  UPDATE: Ubrelvy effective.  Works in 30 minutes.  But she gets a "regular headache" an hour later and then takes Tylenol.  Increased headaches.  Reports increased stress.  Averaging 9 to 10 days a month.  Takes Tylenol or Motrin every other day.     Current NSAIDS/analgesics:  Motrin, Tylenol Current triptans:  none Current ergotamine:  none Current anti-emetic:  Zofran 4mg  Current muscle relaxants:  none Current Antihypertensive medications:  none Current Antidepressant medications:  none Current Anticonvulsant medications:  none Current anti-CGRP:  Ubrelvy 100mg  Current Vitamins/Herbal/Supplements:  magnesium citrate 400mg  daily, CoQ10 300mg  daily and riboflavin 400mg  daily  Current Antihistamines/Decongestants:  meclizine 12.5-25mg  Other therapy:  none Hormone/birth control:  none     Caffeine:  2 cups coffee daily Diet:  12 oz water daily  Ginger ale.  Does not skip meals Exercise:  swimming 3 days a week.  Before it got hot, walking 3 days a week Depression:  no; Anxiety:  no Sleep hygiene:  good   HISTORY: She started getting episodes of headache with dizziness and sinus symptoms several years ago, but became more frequent around 2020.  She was previously diagnosed with vasomotor rhinitis due to the prominent rhinorrhea.  She describes onset of runny nose with diffuse and right sided severe pressure headache with bilateral facial and teeth pain.  Sometimes visual aura of jagged  flashes in the eye.  Associated with nausea, vertigo, bilateral ear pressure, brain fog, trouble focusing eyes.  Lasts 24 hours.  Initially occurring every 9 days.  Treats with Tylenol and sometimes Sudafed and meclizine.  Started increasing Tylenol intake (daily) and now occurring almost daily.  Triggers include strong smells (air fresheners, Clorox), eye strain and air blowing in face.  Relieving factors include resting in dark room.  Evaluated by neurology, ENT, allergist and ophthalmology with negative workup.  Believed to be migraines.   MRI of brain and IACs with and without contrast on 06/21/2020 personally reviewed was unremarkable.   Past NSAIDS/analgesics:  tramadol Past abortive triptans:  rizatriptan Past abortive ergotamine:  none Past muscle relaxants:  none Past anti-emetic:  Zofran Past antihypertensive medications:  none Past antidepressant medications:  none Past anticonvulsant medications:  none Past anti-CGRP:  Nurtec Past vitamins/Herbal/Supplements:  none Past antihistamines/decongestants:  Zyrtec Other past therapies:  none    Family history of headache:  father.  Other family history:  Sister has ALS  PAST MEDICAL HISTORY: Past Medical History:  Diagnosis Date   Allergic rhinitis    Anxiety    Arthritis    right hand   High cholesterol    Hypotension    Transient gluten sensitivity    diarrhea and stomaCH pains   Vasomotor rhinitis     MEDICATIONS: Current Outpatient Medications on File Prior to Visit  Medication Sig Dispense Refill   acetaminophen (TYLENOL) 500 MG tablet Take 1,000 mg by mouth every 6 (six) hours as needed (headaches.).  Cholecalciferol 50 MCG (2000 UT) TABS Take by mouth.     colestipol (COLESTID) 1 g tablet Take 2 g by mouth daily.     EPINEPHrine 0.3 mg/0.3 mL IJ SOAJ injection Inject into the muscle as directed.     meclizine (ANTIVERT) 12.5 MG tablet Take 12.5-25 mg by mouth daily as needed.     ondansetron (ZOFRAN-ODT) 4 MG  disintegrating tablet Take 1 tablet (4 mg total) by mouth every 8 (eight) hours as needed for nausea or vomiting. 20 tablet 5   Pediatric Multivitamins-Iron (CHILD CHEWABLE VITAMINS/IRON) chewable tablet Chew 2 tablets by mouth every other day.     rizatriptan (MAXALT) 5 MG tablet Take 5 mg by mouth as needed for migraine.     Ubrogepant (UBRELVY) 100 MG TABS TAKE 1 TABLET BY MOUTH ONCE AS NEEDED FOR EARLIEST ONSET OF MIGRAINE. MAY REPEAT IN 2 HOURS. MAX OF 2 TABLETS IN A 24 HOUR PERIOD 16 tablet 11   VITAMIN B COMPLEX-C PO Take 1 tablet by mouth daily.     No current facility-administered medications on file prior to visit.    ALLERGIES: Allergies  Allergen Reactions   Alpha-Gal Anaphylaxis    Throat swelling.   Atorvastatin Anaphylaxis   Cephalosporins     Throat swollen   Gelatin Anaphylaxis   Meat Extract Anaphylaxis   Alendronate Sodium Nausea And Vomiting   Doxycycline    Gluten Diarrhea and Nausea Only   Gluten Meal Diarrhea   Hyoscyamine Sulfate Nausea Only   Clindamycin Rash   Cortisone Rash   Paxil [Paroxetine] Rash   Penicillins Rash    Did it involve swelling of the face/tongue/throat, SOB, or low BP? No Did it involve sudden or severe rash/hives, skin peeling, or any reaction on the inside of your mouth or nose? Yes Did you need to seek medical attention at a hospital or doctor's office? No When did it last happen? 5 years ago (~2015)  If all above answers are "NO", may proceed with cephalosporin use.    Prednisone Rash   Sulfonamide Derivatives Rash    FAMILY HISTORY: Family History  Problem Relation Age of Onset   Stroke Mother    Prostate cancer Father    Emphysema Father    Other Sister        Corticobasal syndrome   ALS Maternal Uncle       Objective:  Blood pressure 116/65, pulse 80, height 5\' 4"  (1.626 m), weight 141 lb (64 kg), SpO2 98 %. General: No acute distress.  Patient appears well-groomed.   Head:  Normocephalic/atraumatic Neck:   Supple.  No paraspinal tenderness.  Full range of motion. Heart:  Regular rate and rhythm. Neuro:  Alert and oriented.  Speech fluent and not dysarthric.  Language intact.  CN II-XII intact.  Bulk and tone normal.  Muscle strength 5/5 throughout.  Deep tendon reflexes 2+ throughout.  Gait normal.  Romberg negative.   Shon Millet, DO

## 2022-06-30 ENCOUNTER — Ambulatory Visit: Payer: Medicare Other | Admitting: Neurology

## 2022-06-30 ENCOUNTER — Encounter: Payer: Self-pay | Admitting: Neurology

## 2022-06-30 VITALS — BP 116/65 | HR 80 | Ht 64.0 in | Wt 141.0 lb

## 2022-06-30 DIAGNOSIS — G43109 Migraine with aura, not intractable, without status migrainosus: Secondary | ICD-10-CM | POA: Diagnosis not present

## 2022-06-30 MED ORDER — NORTRIPTYLINE HCL 10 MG PO CAPS: 10.0000 mg | ORAL_CAPSULE | Freq: Every day | ORAL | 5 refills | Status: DC

## 2022-06-30 NOTE — Patient Instructions (Signed)
Start nortriptyline 10mg  at bedtime.  If no improvement in 4 to 6 weeks, contact me and we can increase dose Ubrelvy as needed.  Limit use of pain relievers like Tylenol and Motrin to no more than 2 days out of week to prevent risk of rebound or medication-overuse headache.

## 2022-07-02 ENCOUNTER — Other Ambulatory Visit: Payer: Self-pay | Admitting: Orthopaedic Surgery

## 2022-07-02 ENCOUNTER — Ambulatory Visit
Admission: RE | Admit: 2022-07-02 | Discharge: 2022-07-02 | Disposition: A | Discharge status: Home / Self Care | Payer: Medicare Other | Source: Ambulatory Visit | Attending: Orthopaedic Surgery | Admitting: Orthopaedic Surgery

## 2022-07-02 DIAGNOSIS — R2231 Localized swelling, mass and lump, right upper limb: Secondary | ICD-10-CM

## 2022-07-02 DIAGNOSIS — M19041 Primary osteoarthritis, right hand: Secondary | ICD-10-CM | POA: Diagnosis not present

## 2022-07-03 DIAGNOSIS — N6489 Other specified disorders of breast: Secondary | ICD-10-CM | POA: Diagnosis not present

## 2022-07-03 DIAGNOSIS — R922 Inconclusive mammogram: Secondary | ICD-10-CM | POA: Diagnosis not present

## 2022-07-08 ENCOUNTER — Ambulatory Visit: Payer: Medicare Other | Admitting: Orthopaedic Surgery

## 2022-07-08 DIAGNOSIS — R2231 Localized swelling, mass and lump, right upper limb: Secondary | ICD-10-CM | POA: Diagnosis not present

## 2022-07-08 NOTE — Progress Notes (Signed)
Office Visit Note   Patient: Shelly [name]           Date of Birth: 1945/10/18           MRN: [id number] Visit Date: 07/08/2022              Requested by: [name], MD [address] [place],  [place] [address] PCP: [name], MD   Assessment & Plan: Visit Diagnoses:  1. Mass of finger of right hand     Plan: [name] is a 77 year old female with MRI findings consistent with degenerative changes of the right MCP joint.  Her arthritis panel was also positive for ANA titers.  Based on these findings and my concern for an underlying rheumatologic process I will make a referral to rheumatology for further evaluation and treatment.  Follow-Up Instructions: No follow-ups on file.   Orders:  No orders of the defined types were placed in this encounter.  No orders of the defined types were placed in this encounter.     Procedures: No procedures performed   Clinical Data: No additional findings.   Subjective: Chief Complaint  Patient presents with   Right Hand - Pain    HPI [name] returns today for MRI review. Review of Systems   Objective: Vital Signs: There were no vitals taken for this visit.  Physical Exam  Ortho Exam Exam of the right hand is unchanged. Specialty Comments:  EXAM: MRI LUMBAR SPINE WITHOUT CONTRAST   TECHNIQUE: Multiplanar, multisequence MR imaging of the lumbar spine was performed. No intravenous contrast was administered.   COMPARISON:  Lumbar spine MRI 08/17/2014   FINDINGS: Segmentation:  Standard.   Alignment:  Trace facet mediated anterolisthesis of L4 on L5.   Vertebrae: No fracture, suspicious marrow lesion, or evidence of discitis.   Conus medullaris and cauda equina: Conus extends to the lower L1 level. Conus and cauda equina appear normal.   Paraspinal and other soft tissues: Bilateral renal cysts including a 3.8 cm cyst on the right with no routine follow-up imaging recommended.   Disc  levels:   Disc desiccation throughout the lumbar spine.   T12-L1: Negative.   L1-2: Decreased size of a small central disc protrusion with annular fissure. No stenosis.   L2-3: Mild circumferential disc bulging, overall slightly regressed from the prior MRI. Mild facet hypertrophy. No significant stenosis.   L3-4: Mild disc bulging, small left foraminal disc protrusion, and mild-to-moderate facet and ligamentum flavum hypertrophy result in new mild spinal stenosis and mild left lateral recess stenosis without neural foraminal stenosis.   L4-5: Anterolisthesis with left eccentric bulging of uncovered disc, moderate right and severe left facet and ligamentum flavum hypertrophy, and a 4 mm synovial cyst at the anterosuperior aspect of the left facet joint result in moderate spinal stenosis and mild bilateral lateral recess stenosis without significant neural foraminal stenosis. Spinal and left lateral recess stenosis have improved compared to the prior MRI. There are small facet joint effusions.   L5-S1: Mild disc bulging and moderate to severe facet hypertrophy without stenosis, not significantly changed. Small right facet joint effusion.   IMPRESSION: 1. New mild spinal stenosis and mild left lateral recess stenosis at L3-4. 2. Moderate spinal stenosis at L4-5, improved from 2016.     Electronically Signed   By: Sebastian Ache M.D.   On: 08/29/2021 13:05  Imaging: No results found.   PMFS History: Patient Active Problem List   Diagnosis Date Noted   Impingement syndrome  of left shoulder 10/08/2021   Vision disturbance 05/28/2020   Flushing 05/28/2020   Mallet finger of left hand 04/23/2020   Trigger finger, right ring finger 04/23/2020   Malignant carcinoid tumor of the bronchus and lung (HCC) 05/15/2019   Nodule of upper lobe of right lung 10/03/2018   Cigarette smoker 09/05/2018   Pulmonary nodules 08/31/2011   Hemoptysis, unspecified 08/31/2011   Anxiety state  02/13/2010   DEPRESSION 02/13/2010   DIARRHEA 02/13/2010   ABDOMINAL PAIN-PERIUMBILICAL 02/13/2010   Past Medical History:  Diagnosis Date   Allergic rhinitis    Anxiety    Arthritis    right hand   High cholesterol    Hypotension    Transient gluten sensitivity    diarrhea and stomaCH pains   Vasomotor rhinitis     Family History  Problem Relation Age of Onset   Stroke Mother    Prostate cancer Father    Emphysema Father    Other Sister        Corticobasal syndrome   ALS Maternal Uncle     Past Surgical History:  Procedure Laterality Date   EUS N/A 06/28/2013   Procedure: ESOPHAGEAL ENDOSCOPIC ULTRASOUND (EUS) RADIAL;  Surgeon: Willis Modena, MD;  Location: [facility];  Service: Endoscopy;  Laterality: N/A;   GALLBLADDER SURGERY  2005   GANGLION CYST EXCISION Left 2003   TUBAL LIGATION  1981   VIDEO ASSISTED THORACOSCOPY (VATS)/WEDGE RESECTION Right 10/03/2018   Procedure: VIDEO ASSISTED THORACOSCOPY (VATS)/WEDGE RESECTION with multiple node dissection.;  Surgeon: Loreli Slot, MD;  Location: [facility] OR;  Service: Thoracic;  Laterality: Right;   Social History   Occupational History   Occupation: Retired  Tobacco Use   Smoking status: Former    Packs/day: 0.25    Years: 57.00    Additional pack years: 0.00    Total pack years: 14.25    Types: Cigarettes    Quit date: 09/29/2018    Years since quitting: 3.7   Smokeless tobacco: Never  Vaping Use   Vaping Use: Never used  Substance and Sexual Activity   Alcohol use: No   Drug use: No   Sexual activity: Not on file

## 2022-07-08 NOTE — Addendum Note (Signed)
Addended by: Wendi Maya on: 07/08/2022 12:52 PM   Modules accepted: Orders

## 2022-07-13 ENCOUNTER — Ambulatory Visit: Payer: Medicare Other | Admitting: Internal Medicine

## 2022-07-23 ENCOUNTER — Telehealth: Payer: Self-pay | Admitting: Physical Medicine and Rehabilitation

## 2022-07-23 NOTE — Telephone Encounter (Signed)
Patient called. Would like an appointment with Dr. Newton 

## 2022-07-24 NOTE — Telephone Encounter (Signed)
LVM to return call to clinic to get more information. Last injection 08/14/21

## 2022-07-28 ENCOUNTER — Other Ambulatory Visit: Payer: Self-pay | Admitting: Physical Medicine and Rehabilitation

## 2022-07-28 DIAGNOSIS — M48062 Spinal stenosis, lumbar region with neurogenic claudication: Secondary | ICD-10-CM

## 2022-07-28 DIAGNOSIS — M5416 Radiculopathy, lumbar region: Secondary | ICD-10-CM

## 2022-07-28 NOTE — Telephone Encounter (Signed)
Spoke with patient and she is requesting a repeat injection. Patient is having the same type of pain. Last injection in 08/2021 lasted almost a year and she had 75-80% relief. No new falls, accidents or injuries. Please advise

## 2022-07-29 ENCOUNTER — Ambulatory Visit: Payer: Medicare Other | Admitting: Physical Medicine and Rehabilitation

## 2022-08-03 ENCOUNTER — Telehealth: Payer: Self-pay

## 2022-08-03 ENCOUNTER — Other Ambulatory Visit: Payer: Self-pay | Admitting: Physical Medicine and Rehabilitation

## 2022-08-03 MED ORDER — DIAZEPAM 5 MG PO TABS: ORAL_TABLET | ORAL | 0 refills | Status: DC

## 2022-08-03 NOTE — Telephone Encounter (Signed)
Patient is scheduled for injection 08/20/22. Needs pre procedure Valium sent to [facility]

## 2022-08-20 ENCOUNTER — Other Ambulatory Visit: Payer: Self-pay

## 2022-08-20 ENCOUNTER — Ambulatory Visit: Payer: Medicare Other | Admitting: Physical Medicine and Rehabilitation

## 2022-08-20 VITALS — BP 105/72 | HR 96

## 2022-08-20 DIAGNOSIS — M5416 Radiculopathy, lumbar region: Secondary | ICD-10-CM

## 2022-08-20 MED ORDER — METHYLPREDNISOLONE ACETATE 80 MG/ML IJ SUSP
80.0000 mg | Freq: Once | INTRAMUSCULAR | Status: AC
Start: 2022-08-20 — End: 2022-08-20
  Administered 2022-08-20: 80 mg

## 2022-08-20 NOTE — Progress Notes (Signed)
Functional Pain Scale - descriptive words and definitions  Distracting (5)    Aware of pain/able to complete some ADL's but limited by pain/sleep is affected and active distractions are only slightly useful. Moderate range order  Average Pain 5   +Driver, -BT, -Dye Allergies.  Lower back pain on left side

## 2022-08-20 NOTE — Procedures (Signed)
Lumbar Epidural Steroid Injection - Interlaminar Approach with Fluoroscopic Guidance  Patient: SURAIYA [name]      Date of Birth: 07-19-1945 MRN: [id number] PCP: [name], MD      Visit Date: 08/20/2022   Universal Protocol:     Consent Given By: the patient  Position: PRONE  Additional Comments: Vital signs were monitored before and after the procedure. Patient was prepped and draped in the usual sterile fashion. The correct patient, procedure, and site was verified.   Injection Procedure Details:   Procedure diagnoses: Lumbar radiculopathy [M54.16]   Meds Administered:  Meds ordered this encounter  Medications   methylPREDNISolone acetate (DEPO-MEDROL) injection 80 mg     Laterality: Left  Location/Site:  L5-S1  Needle: 3.5 in., 20 ga. Tuohy  Needle Placement: Paramedian epidural  Findings:   -Comments: Excellent flow of contrast into the epidural space.  Procedure Details: Using a paramedian approach from the side mentioned above, the region overlying the inferior lamina was localized under fluoroscopic visualization and the soft tissues overlying this structure were infiltrated with 4 ml. of 1% Lidocaine without Epinephrine. The Tuohy needle was inserted into the epidural space using a paramedian approach.   The epidural space was localized using loss of resistance along with counter oblique bi-planar fluoroscopic views.  After negative aspirate for air, blood, and CSF, a 2 ml. volume of Isovue-250 was injected into the epidural space and the flow of contrast was observed. Radiographs were obtained for documentation purposes.    The injectate was administered into the level noted above.   Additional Comments:  No complications occurred Dressing: 2 x 2 sterile gauze and Band-Aid    Post-procedure details: Patient was observed during the procedure. Post-procedure instructions were reviewed.  Patient left the clinic in stable condition.

## 2022-08-20 NOTE — Patient Instructions (Signed)

## 2022-09-03 NOTE — Progress Notes (Signed)
Shelly [name] - 77 y.o. female MRN [id number]  Date of birth: 11/09/1945  Office Visit Note: Visit Date: 08/20/2022 PCP: [name], MD Referred by: [name], MD  Subjective: Chief Complaint  Patient presents with   Lower Back - Pain   HPI:  Shelly [name] is a 77 y.o. female who comes in today for planned repeat Left L5-S1  Lumbar Interlaminar epidural steroid injection with fluoroscopic guidance.  The patient has failed conservative care including home exercise, medications, time and activity modification.  This injection will be diagnostic and hopefully therapeutic.  Please see requesting physician notes for further details and justification. Patient received more than 50% pain relief from prior injection.   Referring: Ellin Goodie, FNP and Dr. Norlene Campbell    ROS Otherwise per HPI.  Assessment & Plan: Visit Diagnoses:    ICD-10-CM   1. Lumbar radiculopathy  M54.16 XR C-ARM NO REPORT    Epidural Steroid injection    methylPREDNISolone acetate (DEPO-MEDROL) injection 80 mg      Plan: No additional findings.   Meds & Orders:  Meds ordered this encounter  Medications   methylPREDNISolone acetate (DEPO-MEDROL) injection 80 mg    Orders Placed This Encounter  Procedures   XR C-ARM NO REPORT   Epidural Steroid injection    Follow-up: Return for visit to requesting provider as needed.   Procedures: No procedures performed  Lumbar Epidural Steroid Injection - Interlaminar Approach with Fluoroscopic Guidance  Patient: Shelly [name]      Date of Birth: [birth year] MRN: [id number] PCP: [name], MD      Visit Date: 08/20/2022   Universal Protocol:     Consent Given By: the patient  Position: PRONE  Additional Comments: Vital signs were monitored before and after the procedure. Patient was prepped and draped in the usual sterile fashion. The correct patient, procedure, and site was verified.   Injection Procedure Details:   Procedure  diagnoses: Lumbar radiculopathy [M54.16]   Meds Administered:  Meds ordered this encounter  Medications   methylPREDNISolone acetate (DEPO-MEDROL) injection 80 mg     Laterality: Left  Location/Site:  L5-S1  Needle: 3.5 in., 20 ga. Tuohy  Needle Placement: Paramedian epidural  Findings:   -Comments: Excellent flow of contrast into the epidural space.  Procedure Details: Using a paramedian approach from the side mentioned above, the region overlying the inferior lamina was localized under fluoroscopic visualization and the soft tissues overlying this structure were infiltrated with 4 ml. of 1% Lidocaine without Epinephrine. The Tuohy needle was inserted into the epidural space using a paramedian approach.   The epidural space was localized using loss of resistance along with counter oblique bi-planar fluoroscopic views.  After negative aspirate for air, blood, and CSF, a 2 ml. volume of Isovue-250 was injected into the epidural space and the flow of contrast was observed. Radiographs were obtained for documentation purposes.    The injectate was administered into the level noted above.   Additional Comments:  No complications occurred Dressing: 2 x 2 sterile gauze and Band-Aid    Post-procedure details: Patient was observed during the procedure. Post-procedure instructions were reviewed.  Patient left the clinic in stable condition.   Clinical History: EXAM: MRI LUMBAR SPINE WITHOUT CONTRAST   TECHNIQUE: Multiplanar, multisequence MR imaging of the lumbar spine was performed. No intravenous contrast was administered.   COMPARISON:  Lumbar spine MRI 08/17/2014   FINDINGS: Segmentation:  Standard.   Alignment:  Trace facet mediated anterolisthesis of L4  on L5.   Vertebrae: No fracture, suspicious marrow lesion, or evidence of discitis.   Conus medullaris and cauda equina: Conus extends to the lower L1 level. Conus and cauda equina appear normal.   Paraspinal  and other soft tissues: Bilateral renal cysts including a 3.8 cm cyst on the right with no routine follow-up imaging recommended.   Disc levels:   Disc desiccation throughout the lumbar spine.   T12-L1: Negative.   L1-2: Decreased size of a small central disc protrusion with annular fissure. No stenosis.   L2-3: Mild circumferential disc bulging, overall slightly regressed from the prior MRI. Mild facet hypertrophy. No significant stenosis.   L3-4: Mild disc bulging, small left foraminal disc protrusion, and mild-to-moderate facet and ligamentum flavum hypertrophy result in new mild spinal stenosis and mild left lateral recess stenosis without neural foraminal stenosis.   L4-5: Anterolisthesis with left eccentric bulging of uncovered disc, moderate right and severe left facet and ligamentum flavum hypertrophy, and a 4 mm synovial cyst at the anterosuperior aspect of the left facet joint result in moderate spinal stenosis and mild bilateral lateral recess stenosis without significant neural foraminal stenosis. Spinal and left lateral recess stenosis have improved compared to the prior MRI. There are small facet joint effusions.   L5-S1: Mild disc bulging and moderate to severe facet hypertrophy without stenosis, not significantly changed. Small right facet joint effusion.   IMPRESSION: 1. New mild spinal stenosis and mild left lateral recess stenosis at L3-4. 2. Moderate spinal stenosis at L4-5, improved from 2016.     Electronically Signed   By: Sebastian Ache M.D.   On: 08/29/2021 13:05     Objective:  VS:  HT:    WT:   BMI:     BP:105/72  HR:96bpm  TEMP: ( )  RESP:  Physical Exam Vitals and nursing note reviewed.  Constitutional:      General: She is not in acute distress.    Appearance: Normal appearance. She is not ill-appearing.  HENT:     Head: Normocephalic and atraumatic.     Right Ear: External ear normal.     Left Ear: External ear normal.  Eyes:      Extraocular Movements: Extraocular movements intact.  Cardiovascular:     Rate and Rhythm: Normal rate.     Pulses: Normal pulses.  Pulmonary:     Effort: Pulmonary effort is normal. No respiratory distress.  Abdominal:     General: There is no distension.     Palpations: Abdomen is soft.  Musculoskeletal:        General: Tenderness present.     Cervical back: Neck supple.     Right lower leg: No edema.     Left lower leg: No edema.     Comments: Patient has good distal strength with no pain over the greater trochanters.  No clonus or focal weakness.  Skin:    Findings: No erythema, lesion or rash.  Neurological:     General: No focal deficit present.     Mental Status: She is alert and oriented to person, place, and time.     Sensory: No sensory deficit.     Motor: No weakness or abnormal muscle tone.     Coordination: Coordination normal.  Psychiatric:        Mood and Affect: Mood normal.        Behavior: Behavior normal.      Imaging: No results found.

## 2022-09-16 ENCOUNTER — Ambulatory Visit: Payer: Medicare Other | Admitting: Orthopaedic Surgery

## 2022-09-22 ENCOUNTER — Ambulatory Visit: Payer: Medicare Other | Admitting: Orthopaedic Surgery

## 2022-09-22 DIAGNOSIS — M25511 Pain in right shoulder: Secondary | ICD-10-CM

## 2022-09-22 DIAGNOSIS — G8929 Other chronic pain: Secondary | ICD-10-CM

## 2022-09-22 MED ORDER — LIDOCAINE HCL 1 % IJ SOLN
3.0000 mL | INTRAMUSCULAR | Status: AC | PRN
Start: 2022-09-22 — End: 2022-09-22
  Administered 2022-09-22: 3 mL

## 2022-09-22 MED ORDER — BUPIVACAINE HCL 0.5 % IJ SOLN
3.0000 mL | INTRAMUSCULAR | Status: AC | PRN
Start: 2022-09-22 — End: 2022-09-22
  Administered 2022-09-22: 3 mL via INTRA_ARTICULAR

## 2022-09-22 MED ORDER — METHYLPREDNISOLONE ACETATE 40 MG/ML IJ SUSP
40.0000 mg | INTRAMUSCULAR | Status: AC | PRN
Start: 2022-09-22 — End: 2022-09-22
  Administered 2022-09-22: 40 mg via INTRA_ARTICULAR

## 2022-09-22 NOTE — Progress Notes (Signed)
Office Visit Note   Patient: Shelly [name]           Date of Birth: [birth year]           MRN: [id number] Visit Date: 09/22/2022              Requested by: [name], MD [address] [place],  [place] [address] PCP: [name], MD   Assessment & Plan: Visit Diagnoses:  1. Chronic right shoulder pain     Plan: Shelly [name] is a 77 year old female with right shoulder pain for 2 months.  Impression is rotator cuff tendinopathy and impingement.  Disease process explained and treatment options were reviewed.  She would like a subacromial injection today and she will start the rotator cuff exercises when she feels better.  Follow-Up Instructions: No follow-ups on file.   Orders:  No orders of the defined types were placed in this encounter.  No orders of the defined types were placed in this encounter.     Procedures: Large Joint Inj: R subacromial bursa on 09/22/2022 3:13 PM Indications: pain Details: 22 G needle  Arthrogram: No  Medications: 3 mL lidocaine 1 %; 3 mL bupivacaine 0.5 %; 40 mg methylPREDNISolone acetate 40 MG/ML Outcome: tolerated well, no immediate complications Consent was given by the patient. Patient was prepped and draped in the usual sterile fashion.       Clinical Data: No additional findings.   Subjective: Chief Complaint  Patient presents with   Right Upper Arm - Pain    HPI Shelly [name] is a 77 year old female comes in for right arm pain for 2 months.  Cold increases pain and the pain is worse at night.  Denies any previous injuries or surgeries.  Has pain when she is lifting her arm.  Takes Tylenol Advil for this. Review of Systems  Constitutional: Negative.   HENT: Negative.    Eyes: Negative.   Respiratory: Negative.    Cardiovascular: Negative.   Endocrine: Negative.   Musculoskeletal: Negative.   Neurological: Negative.   Hematological: Negative.   Psychiatric/Behavioral: Negative.    All other systems  reviewed and are negative.    Objective: Vital Signs: There were no vitals taken for this visit.  Physical Exam Vitals and nursing note reviewed.  Constitutional:      Appearance: She is well-developed.  HENT:     Head: Normocephalic and atraumatic.  Pulmonary:     Effort: Pulmonary effort is normal.  Abdominal:     Palpations: Abdomen is soft.  Musculoskeletal:     Cervical back: Neck supple.  Skin:    General: Skin is warm.     Capillary Refill: Capillary refill takes less than 2 seconds.  Neurological:     Mental Status: She is alert and oriented to person, place, and time.  Psychiatric:        Behavior: Behavior normal.        Thought Content: Thought content normal.        Judgment: Judgment normal.     Ortho Exam Examination of the right shoulder shows slight decreased forward flexion and abduction secondary to pain.  Positive Neer.  Pain and minor weakness to manual muscle testing of the supraspinatus. Specialty Comments:  EXAM: MRI LUMBAR SPINE WITHOUT CONTRAST   TECHNIQUE: Multiplanar, multisequence MR imaging of the lumbar spine was performed. No intravenous contrast was administered.   COMPARISON:  Lumbar spine MRI 08/17/2014   FINDINGS: Segmentation:  Standard.   Alignment:  Trace facet  mediated anterolisthesis of L4 on L5.   Vertebrae: No fracture, suspicious marrow lesion, or evidence of discitis.   Conus medullaris and cauda equina: Conus extends to the lower L1 level. Conus and cauda equina appear normal.   Paraspinal and other soft tissues: Bilateral renal cysts including a 3.8 cm cyst on the right with no routine follow-up imaging recommended.   Disc levels:   Disc desiccation throughout the lumbar spine.   T12-L1: Negative.   L1-2: Decreased size of a small central disc protrusion with annular fissure. No stenosis.   L2-3: Mild circumferential disc bulging, overall slightly regressed from the prior MRI. Mild facet hypertrophy. No  significant stenosis.   L3-4: Mild disc bulging, small left foraminal disc protrusion, and mild-to-moderate facet and ligamentum flavum hypertrophy result in new mild spinal stenosis and mild left lateral recess stenosis without neural foraminal stenosis.   L4-5: Anterolisthesis with left eccentric bulging of uncovered disc, moderate right and severe left facet and ligamentum flavum hypertrophy, and a 4 mm synovial cyst at the anterosuperior aspect of the left facet joint result in moderate spinal stenosis and mild bilateral lateral recess stenosis without significant neural foraminal stenosis. Spinal and left lateral recess stenosis have improved compared to the prior MRI. There are small facet joint effusions.   L5-S1: Mild disc bulging and moderate to severe facet hypertrophy without stenosis, not significantly changed. Small right facet joint effusion.   IMPRESSION: 1. New mild spinal stenosis and mild left lateral recess stenosis at L3-4. 2. Moderate spinal stenosis at L4-5, improved from 2016.     Electronically Signed   By: Sebastian Ache M.D.   On: 08/29/2021 13:05  Imaging: No results found.   PMFS History: Patient Active Problem List   Diagnosis Date Noted   Impingement syndrome of left shoulder 10/08/2021   Migraine variant 01/27/2021   Vision disturbance 05/28/2020   Flushing 05/28/2020   Mallet finger of left hand 04/23/2020   Trigger finger, right ring finger 04/23/2020   Malignant carcinoid tumor of the bronchus and lung (HCC) 05/15/2019   Nodule of upper lobe of right lung 10/03/2018   Cigarette smoker 09/05/2018   Pulmonary nodules 08/31/2011   Hemoptysis, unspecified 08/31/2011   Anxiety state 02/13/2010   DEPRESSION 02/13/2010   DIARRHEA 02/13/2010   ABDOMINAL PAIN-PERIUMBILICAL 02/13/2010   Past Medical History:  Diagnosis Date   Allergic rhinitis    Anxiety    Arthritis    right hand   High cholesterol    Hypotension    Transient gluten  sensitivity    diarrhea and stomaCH pains   Vasomotor rhinitis     Family History  Problem Relation Age of Onset   Stroke Mother    Prostate cancer Father    Emphysema Father    Other Sister        Corticobasal syndrome   ALS Maternal Uncle     Past Surgical History:  Procedure Laterality Date   EUS N/A 06/28/2013   Procedure: ESOPHAGEAL ENDOSCOPIC ULTRASOUND (EUS) RADIAL;  Surgeon: Willis Modena, MD;  Location: [facility];  Service: Endoscopy;  Laterality: N/A;   GALLBLADDER SURGERY  2005   GANGLION CYST EXCISION Left 2003   TUBAL LIGATION  1981   VIDEO ASSISTED THORACOSCOPY (VATS)/WEDGE RESECTION Right 10/03/2018   Procedure: VIDEO ASSISTED THORACOSCOPY (VATS)/WEDGE RESECTION with multiple node dissection.;  Surgeon: Loreli Slot, MD;  Location[facility];  Service: Thoracic;  Laterality: Right;   Social History   Occupational History   Occupation: Retired  Tobacco Use   Smoking status: Former    Current packs/day: 0.00    Average packs/day: 0.3 packs/day for 57.0 years (14.3 ttl pk-yrs)    Types: Cigarettes    Start date: 09/28/1961    Quit date: 09/29/2018    Years since quitting: 3.9   Smokeless tobacco: Never  Vaping Use   Vaping status: Never Used  Substance and Sexual Activity   Alcohol use: No   Drug use: No   Sexual activity: Not on file

## 2022-09-30 ENCOUNTER — Inpatient Hospital Stay: Payer: Medicare Other | Attending: Internal Medicine

## 2022-09-30 ENCOUNTER — Ambulatory Visit (HOSPITAL_COMMUNITY)
Admission: RE | Admit: 2022-09-30 | Discharge: 2022-09-30 | Disposition: A | Discharge status: Home / Self Care | Payer: Medicare Other | Source: Ambulatory Visit | Attending: Physician Assistant | Admitting: Physician Assistant

## 2022-09-30 ENCOUNTER — Other Ambulatory Visit: Payer: Self-pay

## 2022-09-30 DIAGNOSIS — C7A090 Malignant carcinoid tumor of the bronchus and lung: Secondary | ICD-10-CM | POA: Insufficient documentation

## 2022-09-30 DIAGNOSIS — I700 Atherosclerosis of aorta: Secondary | ICD-10-CM | POA: Diagnosis not present

## 2022-09-30 DIAGNOSIS — R918 Other nonspecific abnormal finding of lung field: Secondary | ICD-10-CM | POA: Diagnosis not present

## 2022-09-30 LAB — CBC WITH DIFFERENTIAL (CANCER CENTER ONLY)
Abs Immature Granulocytes: 0.12 10*3/uL — ABNORMAL HIGH (ref 0.00–0.07)
Basophils Absolute: 0.1 10*3/uL (ref 0.0–0.1)
Basophils Relative: 2 %
Eosinophils Absolute: 0.2 10*3/uL (ref 0.0–0.5)
Eosinophils Relative: 2 %
HCT: 38.1 % (ref 36.0–46.0)
Hemoglobin: 12.7 g/dL (ref 12.0–15.0)
Immature Granulocytes: 1 %
Lymphocytes Relative: 35 %
Lymphs Abs: 2.9 10*3/uL (ref 0.7–4.0)
MCH: 31.4 pg (ref 26.0–34.0)
MCHC: 33.3 g/dL (ref 30.0–36.0)
MCV: 94.1 fL (ref 80.0–100.0)
Monocytes Absolute: 0.6 10*3/uL (ref 0.1–1.0)
Monocytes Relative: 7 %
Neutro Abs: 4.5 10*3/uL (ref 1.7–7.7)
Neutrophils Relative %: 53 %
Platelet Count: 369 10*3/uL (ref 150–400)
RBC: 4.05 MIL/uL (ref 3.87–5.11)
RDW: 13.3 % (ref 11.5–15.5)
WBC Count: 8.3 10*3/uL (ref 4.0–10.5)
nRBC: 0 % (ref 0.0–0.2)

## 2022-09-30 LAB — CMP (CANCER CENTER ONLY)
ALT: 14 U/L (ref 0–44)
AST: 15 U/L (ref 15–41)
Albumin: 4.2 g/dL (ref 3.5–5.0)
Alkaline Phosphatase: 70 U/L (ref 38–126)
Anion gap: 6 (ref 5–15)
BUN: 18 mg/dL (ref 8–23)
CO2: 29 mmol/L (ref 22–32)
Calcium: 9.2 mg/dL (ref 8.9–10.3)
Chloride: 106 mmol/L (ref 98–111)
Creatinine: 0.96 mg/dL (ref 0.44–1.00)
GFR, Estimated: >60 mL/min (ref >60)
Glucose, Bld: 93 mg/dL (ref 70–99)
Potassium: 3.7 mmol/L (ref 3.5–5.1)
Sodium: 141 mmol/L (ref 135–145)
Total Bilirubin: 0.4 mg/dL (ref 0.3–1.2)
Total Protein: 7.3 g/dL (ref 6.5–8.1)

## 2022-09-30 MED ORDER — IOHEXOL 300 MG/ML  SOLN
75.0000 mL | Freq: Once | INTRAMUSCULAR | Status: AC | PRN
  Administered 2022-09-30: 75 mL via INTRAVENOUS

## 2022-10-07 ENCOUNTER — Inpatient Hospital Stay: Payer: Medicare Other | Attending: Internal Medicine | Admitting: Internal Medicine

## 2022-10-07 VITALS — BP 117/68 | HR 84 | Temp 97.9°F | Resp 16 | Ht 64.0 in | Wt 139.5 lb

## 2022-10-07 DIAGNOSIS — Z08 Encounter for follow-up examination after completed treatment for malignant neoplasm: Secondary | ICD-10-CM | POA: Insufficient documentation

## 2022-10-07 DIAGNOSIS — C7A090 Malignant carcinoid tumor of the bronchus and lung: Secondary | ICD-10-CM | POA: Diagnosis not present

## 2022-10-07 DIAGNOSIS — Z85110 Personal history of malignant carcinoid tumor of bronchus and lung: Secondary | ICD-10-CM | POA: Diagnosis not present

## 2022-10-07 NOTE — Progress Notes (Signed)
[facility] [phone]) [phone]   Fax:([phone]  OFFICE PROGRESS NOTE  [name], MD [address] [place] [address]  DIAGNOSIS: stage IA (T1b, N0, M0) typical carcinoid tumor. The patient has multiple other pulmonary nodules that need close monitoring and observation.  PRIOR THERAPY: Status post wedge resection of right upper lobectomy with lymph node sampling under the care of Dr. Dorris Fetch on 10/03/2018.  CURRENT THERAPY: Observation.  INTERVAL HISTORY: Shelly [name] 77 y.o. female returns to the clinic today for follow-up visit accompanied by her friend.Discussed the use of AI scribe software for clinical note transcription with the patient, who gave verbal consent to proceed.  History of Present Illness   Shelly [name], a 77 year old patient with a history of stage one typical carcinoid tumor, underwent surgical resection of the right upper lobe of the lung in September 2020. The patient has been under regular surveillance with annual scans and lab work since the surgery. Three months prior to the current consultation, the patient was prescribed antibiotics for a questionable inflammatory lung condition.  The patient expressed concern about recent lab results, which showed an elevated level of immature granulocytes. However, the patient denied any new physical complaints related to this finding. The patient also reported frequent migraines, for which she takes [place], and recent episodes of hot flashes. The patient also mentioned having dark circles under her eyes, suggesting possible sleep disturbances.  In addition to the above, the patient has been experiencing arthritis-like symptoms, with a lump in an unspecified location. This was being investigated by an orthopedic specialist, Dr. Cathie Hoops, who ordered ANA and antinuclear antibodies tests. The results of these tests were abnormal.  The patient's overall condition appears  stable with no new complaints related to her previous lung cancer diagnosis. The patient's primary concerns during this consultation were the abnormal lab results and their implications, as well as the frequent migraines and recent hot flashes.         MEDICAL HISTORY: Past Medical History:  Diagnosis Date   Allergic rhinitis    Anxiety    Arthritis    right hand   High cholesterol    Hypotension    Transient gluten sensitivity    diarrhea and stomaCH pains   Vasomotor rhinitis     ALLERGIES:  is allergic to alpha-gal, atorvastatin, cephalosporins, gelatin, meat extract, alendronate sodium, doxycycline, ezetimibe, gluten, gluten meal, hyoscyamine sulfate, clindamycin, cortisone, paxil [paroxetine], penicillins, prednisone, and sulfonamide derivatives.  MEDICATIONS:  Current Outpatient Medications  Medication Sig Dispense Refill   acetaminophen (TYLENOL) 500 MG tablet Take 1,000 mg by mouth every 6 (six) hours as needed (headaches.).      calcium carbonate (SUPER CALCIUM) 1500 (600 Ca) MG TABS tablet Take 1,500 mg by mouth.     Cholecalciferol 50 MCG (2000 UT) TABS Take by mouth.     diazepam (VALIUM) 5 MG tablet Take one tablet by mouth with food one hour prior to procedure. May repeat 30 minutes prior if needed. 2 tablet 0   EPINEPHrine 0.3 mg/0.3 mL IJ SOAJ injection Inject into the muscle as directed.     meclizine (ANTIVERT) 12.5 MG tablet Take 12.5-25 mg by mouth daily as needed.     nortriptyline (PAMELOR) 10 MG capsule Take 1 capsule (10 mg total) by mouth at bedtime. 30 capsule 5   ondansetron (ZOFRAN-ODT) 4 MG disintegrating tablet Take 1 tablet (4 mg total) by mouth every 8 (eight) hours as needed for  nausea or vomiting. 20 tablet 5   Pediatric Multivitamins-Iron (CHILD CHEWABLE VITAMINS/IRON) chewable tablet Chew 2 tablets by mouth every other day.     Rimegepant Sulfate (NURTEC) 75 MG TBDP Take by mouth.     Ubrogepant (UBRELVY) 100 MG TABS TAKE 1 TABLET BY MOUTH ONCE AS  NEEDED FOR EARLIEST ONSET OF MIGRAINE. MAY REPEAT IN 2 HOURS. MAX OF 2 TABLETS IN A 24 HOUR PERIOD 16 tablet 11   VITAMIN B COMPLEX-C PO Take 1 tablet by mouth daily.     No current facility-administered medications for this visit.    SURGICAL HISTORY:  Past Surgical History:  Procedure Laterality Date   EUS N/A 06/28/2013   Procedure: ESOPHAGEAL ENDOSCOPIC ULTRASOUND (EUS) RADIAL;  Surgeon: Willis Modena, MD;  Location: [facility];  Service: Endoscopy;  Laterality: N/A;   GALLBLADDER SURGERY  2005   GANGLION CYST EXCISION Left 2003   TUBAL LIGATION  1981   VIDEO ASSISTED THORACOSCOPY (VATS)/WEDGE RESECTION Right 10/03/2018   Procedure: VIDEO ASSISTED THORACOSCOPY (VATS)/WEDGE RESECTION with multiple node dissection.;  Surgeon: Loreli Slot, MD;  Location: [facility] OR;  Service: Thoracic;  Laterality: Right;    REVIEW OF SYSTEMS:  A comprehensive review of systems was negative except for: Neurological: positive for dizziness and headaches   PHYSICAL EXAMINATION: General appearance: alert, cooperative, and no distress Head: Normocephalic, without obvious abnormality, atraumatic Neck: no adenopathy, no JVD, supple, symmetrical, trachea midline, and thyroid not enlarged, symmetric, no tenderness/mass/nodules Lymph nodes: Cervical, supraclavicular, and axillary nodes normal. Resp: clear to auscultation bilaterally Back: symmetric, no curvature. ROM normal. No CVA tenderness. Cardio: regular rate and rhythm, S1, S2 normal, no murmur, click, rub or gallop GI: soft, non-tender; bowel sounds normal; no masses,  no organomegaly Extremities: extremities normal, atraumatic, no cyanosis or edema  ECOG PERFORMANCE STATUS: 1 - Symptomatic but completely ambulatory  Blood pressure 117/68, pulse 84, temperature 97.9 F (36.6 C), temperature source Oral, resp. rate 16, height 5\' 4"  (1.626 m), weight 139 lb 8 oz (63.3 kg), SpO2 97%.  LABORATORY DATA: Lab Results  Component Value Date   WBC  8.3 09/30/2022   HGB 12.7 09/30/2022   HCT 38.1 09/30/2022   MCV 94.1 09/30/2022   PLT 369 09/30/2022      Chemistry      Component Value Date/Time   NA 141 09/30/2022 1249   K 3.7 09/30/2022 1249   CL 106 09/30/2022 1249   CO2 29 09/30/2022 1249   BUN 18 09/30/2022 1249   CREATININE 0.96 09/30/2022 1249      Component Value Date/Time   CALCIUM 9.2 09/30/2022 1249   ALKPHOS 70 09/30/2022 1249   AST 15 09/30/2022 1249   ALT 14 09/30/2022 1249   BILITOT 0.4 09/30/2022 1249       RADIOGRAPHIC STUDIES: CT Chest W Contrast  Result Date: 10/07/2022 CLINICAL DATA:  Surveillance hx of carcinoid of the lung and other small nodules; * Tracking Code: BO * EXAM: CT CHEST WITH CONTRAST TECHNIQUE: Multidetector CT imaging of the chest was performed during intravenous contrast administration. RADIATION DOSE REDUCTION: This exam was performed according to the departmental dose-optimization program which includes automated exposure control, adjustment of the mA and/or kV according to patient size and/or use of iterative reconstruction technique. CONTRAST:  75mL OMNIPAQUE IOHEXOL 300 MG/ML  SOLN COMPARISON:  Multiple priors, most recent chest CT dated June 12, 2022 FINDINGS: Cardiovascular: Normal heart size. No pericardial effusion. Normal caliber thoracic aorta with mild atherosclerotic disease. No coronary artery calcifications. Mediastinum/Nodes: Esophagus  and thyroid are unremarkable. Stable prominent subcentimeter retrotracheal lymph node measuring 8 mm in short axis on series 2, image 22. No enlarged lymph nodes seen in the chest. Lungs/Pleura: Central airways are patent. Stable postsurgical changes of right upper lobe wedge resection. Branching nodular opacities of the right upper lobe which were new on prior exam are stable. Interval decreased branching nodular opacity of the posterior left lower lobe located on image 89. Stable bilateral solid pulmonary nodules. Reference right lower lobe solid  nodule measuring 6 mm on series 6, image 83. Reference solid nodule of the lingula measuring 10 x 6 mm on image 87. Upper Abdomen: Partially visualized simple appearing cyst of the right kidney, no specific follow-up imaging is necessary prior cholecystectomy. Stable scattered small low-attenuation liver lesions, likely simple cysts or hemangiomas. Musculoskeletal: No chest wall abnormality. No acute or significant osseous findings. IMPRESSION: 1. Stable postsurgical changes of right upper lobe wedge resection. 2. Stable branching opacities of the right upper lobe and right middle lobe and decreased size of left lower lobe branching opacity. 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 Recommend continued surveillance or further evaluation with bronchoscopy. 3. Additional scattered bilateral solid pulmonary nodules are stable. 4. Aortic Atherosclerosis (ICD10-I70.0). Electronically Signed   By: Allegra Lai M.D.   On: 10/07/2022 11:22     ASSESSMENT AND PLAN: This is a very pleasant 77 years old white female with a stage Ia carcinoid tumor of the lung status post wedge resection of the right upper lobe with lymph node sampling in September 2020 under the care of Dr. Dorris Fetch. The patient is currently on observation and she is feeling fine with no concerning complaints. I personally and independently reviewed the scan and discussed the result with the patient and her friend. Her scan showed no concerning findings for disease recurrence or metastasis.  There was improvement in the size of the left lower lobe branching opacity consistent with bronchoceles. Assessment and Plan    Stage 1 Typical Carcinoid Tumor Post-surgical resection in September 2020 with no signs of recurrence on recent scan. Inflammatory area noted on scan, not malignant. -Continue annual scans and lab work. Next scan due in one year.  Arthritis Elevated ANA and antinuclear  antibodies noted on labs ordered by orthopedic specialist, likely related to arthritis. -No specific plan discussed in this conversation.  Migraines Frequent migraines managed with Bernita Raisin. -No changes to current management plan.  Aortic Atherosclerosis Noted on scan, common for patient's age. -No specific plan discussed in this conversation.  Follow-up in one year or sooner if any issues arise.   The patient was advised to call immediately if she has any other concerning symptoms in the interval. The patient voices understanding of current disease status and treatment options and is in agreement with the current care plan.  All questions were answered. The patient knows to call the clinic with any problems, questions or concerns. We can certainly see the patient much sooner if necessary.  Disclaimer: This note was dictated with voice recognition software. Similar sounding words can inadvertently be transcribed and may not be corrected upon review.

## 2022-10-22 DIAGNOSIS — H5213 Myopia, bilateral: Secondary | ICD-10-CM | POA: Diagnosis not present

## 2022-11-10 ENCOUNTER — Other Ambulatory Visit (HOSPITAL_COMMUNITY): Payer: Self-pay

## 2022-11-10 ENCOUNTER — Telehealth: Payer: Self-pay | Admitting: Pharmacy Technician

## 2022-11-10 NOTE — Telephone Encounter (Signed)
Pharmacy Patient Advocate Encounter   Received notification from CoverMyMeds that prior authorization for Ubrelvy 100MG  tablets is required/requested.   Insurance verification completed.   The patient is insured through [facility] .   Per test claim: PA required; PA started via CoverMyMeds. KEY [id number] . Waiting for clinical questions to populate.

## 2022-11-12 NOTE — Telephone Encounter (Signed)
Clinical Questions Have Been Submitted

## 2022-11-13 NOTE — Telephone Encounter (Signed)
Left message with the after hour service on 11-12-22 at 5:25 pm   Caller state she is calling about Prior auth on the [place]

## 2022-11-20 ENCOUNTER — Other Ambulatory Visit (HOSPITAL_COMMUNITY): Payer: Self-pay

## 2022-11-20 NOTE — Telephone Encounter (Signed)
Pharmacy Patient Advocate Encounter  Received notification from [facility] that Prior Authorization for Ubrelvy 100MG  tablets  has been APPROVED from 11/12/2022 to 11/12/2023. Ran test claim, Copay is $391.87 due to being in the Coverage Gap (donut hole). This test claim was processed through [facility]- copay amounts may vary at other pharmacies due to pharmacy/plan contracts, or as the patient moves through the different stages of their insurance plan.   PA #/Case ID/Reference #: [id number]

## 2022-11-25 DIAGNOSIS — I700 Atherosclerosis of aorta: Secondary | ICD-10-CM | POA: Diagnosis not present

## 2022-12-01 ENCOUNTER — Encounter: Payer: Medicare Other | Admitting: Internal Medicine

## 2023-01-07 NOTE — Progress Notes (Signed)
 NEUROLOGY FOLLOW UP OFFICE NOTE  Shelly [name] [id number]  Assessment/Plan:   Chronic migraine with aura, without status migrainosus, not intractable   Start Depakote  ER 250mg  daily.  Increase to 500mg  at bedtime in 4-6 weeks if needed.  Check CBC and CMP today and again in 3 months.  If ineffective or has side effects, plan would be start Botox . For migraine rescue:  Ubrelvy  100mg , Zofran  for nausea.  May use Tylenol  or Motrin but limit to no more than 2 days out of week Follow up 6 months     Subjective:  Shelly [name] is a 78 year old female with gluten allergy, who follows up for migraine.  UPDATE: Started nortriptyline  and stopped due to side effects.   Intensity:  moderate and severe Duration:  moderate last 1 hour with Ubrelvy , 24 hours for severe Frequency:  15 days a month (2 are severe)   Current NSAIDS/analgesics:  Motrin, Tylenol  Current triptans:  none Current ergotamine:  none Current anti-emetic:  Zofran  4mg  Current muscle relaxants:  none Current Antihypertensive medications:  none Current Antidepressant medications:  none Current Anticonvulsant medications:  none Current anti-CGRP:  Ubrelvy  100mg  Current Vitamins/Herbal/Supplements:  magnesium citrate 400mg  daily, CoQ10 300mg  daily and riboflavin 400mg  daily  Current Antihistamines/Decongestants:  meclizine 12.5-25mg  Other therapy:  none      Caffeine:  2 cups coffee daily Diet:  12 oz water daily  Ginger ale.  Does not skip meals Exercise:  swimming 3 days a week.  Before it got hot, walking 3 days a week Depression:  no; Anxiety:  no Sleep hygiene:  good   HISTORY: She started getting episodes of headache with dizziness and sinus symptoms several years ago, but became more frequent around 2020.  She was previously diagnosed with vasomotor rhinitis due to the prominent rhinorrhea.  She describes onset of runny nose with diffuse and right sided severe pressure headache with bilateral facial and  teeth pain.  Sometimes visual aura of jagged flashes in the eye.  Associated with nausea, vertigo, bilateral ear pressure, brain fog, trouble focusing eyes.  Lasts 24 hours.  Initially occurring every 9 days.  Treats with Tylenol  and sometimes Sudafed and meclizine.  Started increasing Tylenol  intake (daily) and now occurring almost daily.  Triggers include strong smells (air fresheners, Clorox), eye strain and air blowing in face.  Relieving factors include resting in dark room.  Evaluated by neurology, ENT, allergist and ophthalmology with negative workup.  Believed to be migraines.   MRI of brain and IACs with and without contrast on 06/21/2020 personally reviewed was unremarkable.   Past NSAIDS/analgesics:  tramadol  Past abortive triptans:  rizatriptan Past abortive ergotamine:  none Past muscle relaxants:  none Past anti-emetic:  Zofran  Past antihypertensive medications:  none Past antidepressant medications:  nortriptyline  Past anticonvulsant medications:  none Past anti-CGRP:  Nurtec Past vitamins/Herbal/Supplements:  none Past antihistamines/decongestants:  Zyrtec Other past therapies:  none    Family history of headache:  father.  Other family history:  Sister has ALS  PAST MEDICAL HISTORY: Past Medical History:  Diagnosis Date   Allergic rhinitis    Anxiety    Arthritis    right hand   High cholesterol    Hypotension    Transient gluten sensitivity    diarrhea and stomaCH pains   Vasomotor rhinitis     MEDICATIONS: Current Outpatient Medications on File Prior to Visit  Medication Sig Dispense Refill   acetaminophen  (TYLENOL ) 500 MG tablet Take 1,000 mg by mouth  every 6 (six) hours as needed (headaches.).      calcium carbonate (SUPER CALCIUM) 1500 (600 Ca) MG TABS tablet Take 1,500 mg by mouth.     Cholecalciferol 50 MCG (2000 UT) TABS Take by mouth.     diazepam  (VALIUM ) 5 MG tablet Take one tablet by mouth with food one hour prior to procedure. May repeat 30 minutes  prior if needed. 2 tablet 0   EPINEPHrine 0.3 mg/0.3 mL IJ SOAJ injection Inject into the muscle as directed.     meclizine (ANTIVERT) 12.5 MG tablet Take 12.5-25 mg by mouth daily as needed.     nortriptyline  (PAMELOR ) 10 MG capsule Take 1 capsule (10 mg total) by mouth at bedtime. 30 capsule 5   ondansetron  (ZOFRAN -ODT) 4 MG disintegrating tablet Take 1 tablet (4 mg total) by mouth every 8 (eight) hours as needed for nausea or vomiting. 20 tablet 5   Pediatric Multivitamins-Iron (CHILD CHEWABLE VITAMINS/IRON) chewable tablet Chew 2 tablets by mouth every other day.     Rimegepant Sulfate (NURTEC) 75 MG TBDP Take by mouth.     Ubrogepant  (UBRELVY ) 100 MG TABS TAKE 1 TABLET BY MOUTH ONCE AS NEEDED FOR EARLIEST ONSET OF MIGRAINE. MAY REPEAT IN 2 HOURS. MAX OF 2 TABLETS IN A 24 HOUR PERIOD 16 tablet 11   VITAMIN B COMPLEX-C PO Take 1 tablet by mouth daily.     No current facility-administered medications on file prior to visit.    ALLERGIES: Allergies  Allergen Reactions   Alpha-Gal Anaphylaxis    Throat swelling.   Atorvastatin Anaphylaxis   Cephalosporins     Throat swollen   Gelatin Anaphylaxis   Meat Extract Anaphylaxis   Alendronate Sodium Nausea And Vomiting   Doxycycline    Ezetimibe Other (See Comments)   Gluten Diarrhea and Nausea Only   Gluten Meal Diarrhea   Hyoscyamine Sulfate Nausea Only   Clindamycin Rash   Cortisone Rash   Paxil [Paroxetine] Rash   Penicillins Rash    Did it involve swelling of the face/tongue/throat, SOB, or low BP? No Did it involve sudden or severe rash/hives, skin peeling, or any reaction on the inside of your mouth or nose? Yes Did you need to seek medical attention at a hospital or doctor's office? No When did it last happen? 5 years ago (~2015)  If all above answers are "NO", may proceed with cephalosporin use.    Prednisone Rash   Sulfonamide Derivatives Rash    FAMILY HISTORY: Family History  Problem Relation Age of Onset   Stroke  Mother    Prostate cancer Father    Emphysema Father    Other Sister        Corticobasal syndrome   ALS Maternal Uncle       Objective:  Blood pressure (!) 107/57, pulse 99, height 5' 4 (1.626 m), weight 144 lb (65.3 kg), SpO2 96%. General: No acute distress.  Patient appears well-groomed.      Juliene Dunnings, DO  CC:  [employer], MD

## 2023-01-08 ENCOUNTER — Encounter: Payer: Self-pay | Admitting: Neurology

## 2023-01-08 ENCOUNTER — Other Ambulatory Visit: Payer: Medicare Other

## 2023-01-08 ENCOUNTER — Ambulatory Visit (INDEPENDENT_AMBULATORY_CARE_PROVIDER_SITE_OTHER): Payer: Medicare Other | Admitting: Neurology

## 2023-01-08 VITALS — BP 107/57 | HR 99 | Ht 64.0 in | Wt 144.0 lb

## 2023-01-08 DIAGNOSIS — Z79899 Other long term (current) drug therapy: Secondary | ICD-10-CM | POA: Diagnosis not present

## 2023-01-08 DIAGNOSIS — G43109 Migraine with aura, not intractable, without status migrainosus: Secondary | ICD-10-CM

## 2023-01-08 MED ORDER — DIVALPROEX SODIUM ER 250 MG PO TB24: 250.0000 mg | ORAL_TABLET | Freq: Every day | ORAL | 5 refills | Status: DC

## 2023-01-08 NOTE — Patient Instructions (Addendum)
 Start divalproex ER 250mg  at bedtime.  If no improvement in 4 weeks, contact me and we can increase dose.  Check CBC and CMP Ubrelvy and Zofran as needed. Keep headache diary

## 2023-01-09 LAB — COMPREHENSIVE METABOLIC PANEL
AG Ratio: 1.6 (calc) (ref 1.0–2.5)
ALT: 14 U/L (ref 6–29)
AST: 17 U/L (ref 10–35)
Albumin: 4.4 g/dL (ref 3.6–5.1)
Alkaline phosphatase (APISO): 76 U/L (ref 37–153)
BUN: 12 mg/dL (ref 7–25)
CO2: 28 mmol/L (ref 20–32)
Calcium: 9.6 mg/dL (ref 8.6–10.4)
Chloride: 103 mmol/L (ref 98–110)
Creat: 0.84 mg/dL (ref 0.60–1.00)
Globulin: 2.8 g/dL (ref 1.9–3.7)
Glucose, Bld: 82 mg/dL (ref 65–99)
Potassium: 4.6 mmol/L (ref 3.5–5.3)
Sodium: 142 mmol/L (ref 135–146)
Total Bilirubin: 0.3 mg/dL (ref 0.2–1.2)
Total Protein: 7.2 g/dL (ref 6.1–8.1)

## 2023-01-09 LAB — CBC
HCT: 38.3 % (ref 35.0–45.0)
Hemoglobin: 12.8 g/dL (ref 11.7–15.5)
MCH: 31.1 pg (ref 27.0–33.0)
MCHC: 33.4 g/dL (ref 32.0–36.0)
MCV: 93.2 fL (ref 80.0–100.0)
MPV: 9.4 fL (ref 7.5–12.5)
Platelets: 408 10*3/uL — ABNORMAL HIGH (ref 140–400)
RBC: 4.11 10*6/uL (ref 3.80–5.10)
RDW: 11.7 % (ref 11.0–15.0)
WBC: 7.4 10*3/uL (ref 3.8–10.8)

## 2023-01-11 NOTE — Progress Notes (Signed)
 Patient advised.

## 2023-01-15 ENCOUNTER — Encounter: Payer: Self-pay | Admitting: Neurology

## 2023-01-18 ENCOUNTER — Telehealth: Payer: Self-pay | Admitting: Pharmacy Technician

## 2023-01-18 ENCOUNTER — Telehealth: Payer: Self-pay

## 2023-01-18 ENCOUNTER — Other Ambulatory Visit (HOSPITAL_COMMUNITY): Payer: Self-pay

## 2023-01-18 NOTE — Telephone Encounter (Signed)
 Per Dr.Jaffe mychart note, Let us  get prior authorization for Botox  to treat chronic migraine with aura, without status migrainosus, not intractable:   She has 15 headache days a month  She has tried and failed nortriptyline  and divalproex .    PA team please start a PA for Botox .

## 2023-01-18 NOTE — Telephone Encounter (Signed)
 Pharmacy Patient Advocate Encounter  [employer] verification has been submitted. Benefit Verification #:   [id number]  Pharmacy PA has been submitted for BOTOX  200u via CoverMyMeds. INSURANCE: [employer] DATE SUBMITTED: 01/18/23 KEY: [id number] Status is pending

## 2023-01-18 NOTE — Telephone Encounter (Signed)
 PA request has been Submitted. New Encounter created for follow up. For additional info see Pharmacy Prior Auth telephone encounter from 01/18/23.

## 2023-01-28 DIAGNOSIS — Z0000 Encounter for general adult medical examination without abnormal findings: Secondary | ICD-10-CM | POA: Diagnosis not present

## 2023-01-28 DIAGNOSIS — G43909 Migraine, unspecified, not intractable, without status migrainosus: Secondary | ICD-10-CM | POA: Diagnosis not present

## 2023-01-28 DIAGNOSIS — E782 Mixed hyperlipidemia: Secondary | ICD-10-CM | POA: Diagnosis not present

## 2023-01-28 DIAGNOSIS — E559 Vitamin D deficiency, unspecified: Secondary | ICD-10-CM | POA: Diagnosis not present

## 2023-01-28 DIAGNOSIS — M810 Age-related osteoporosis without current pathological fracture: Secondary | ICD-10-CM | POA: Diagnosis not present

## 2023-01-28 NOTE — Telephone Encounter (Signed)
Patient call the office 01/27/23 to advise her she received a letter of approval for Botox.  PA team can you please check and let me know.

## 2023-01-29 ENCOUNTER — Other Ambulatory Visit (HOSPITAL_COMMUNITY): Payer: Self-pay | Admitting: Family Medicine

## 2023-01-29 DIAGNOSIS — E782 Mixed hyperlipidemia: Secondary | ICD-10-CM

## 2023-01-29 MED ORDER — ONABOTULINUMTOXINA 200 UNITS IJ SOLR: INTRAMUSCULAR | 4 refills | Status: DC

## 2023-01-29 NOTE — Telephone Encounter (Signed)
Pharmacy Patient Advocate Encounter  Received notification from [facility] that Prior Authorization for Botox 200UNIT solution has been APPROVED from 01-18-2023 to 01-18-2024   PA #/Case ID/Reference #: [id number]

## 2023-01-29 NOTE — Telephone Encounter (Signed)
Patient scheduled, Script sent to [facility].

## 2023-01-31 ENCOUNTER — Other Ambulatory Visit: Payer: Self-pay | Admitting: Neurology

## 2023-02-10 ENCOUNTER — Ambulatory Visit (HOSPITAL_COMMUNITY)
Admission: RE | Admit: 2023-02-10 | Discharge: 2023-02-10 | Disposition: A | Discharge status: Home / Self Care | Payer: Medicare Other | Source: Ambulatory Visit | Attending: Family Medicine | Admitting: Family Medicine

## 2023-02-10 DIAGNOSIS — E782 Mixed hyperlipidemia: Secondary | ICD-10-CM | POA: Insufficient documentation

## 2023-03-19 ENCOUNTER — Ambulatory Visit: Payer: Medicare Other | Admitting: Neurology

## 2023-03-19 DIAGNOSIS — G43109 Migraine with aura, not intractable, without status migrainosus: Secondary | ICD-10-CM

## 2023-03-19 MED ORDER — ONABOTULINUMTOXINA 100 UNITS IJ SOLR
200.0000 [IU] | Freq: Once | INTRAMUSCULAR | Status: AC
Start: 2023-03-19 — End: 2023-03-19
  Administered 2023-03-19: 155 [IU] via INTRAMUSCULAR

## 2023-03-19 NOTE — Progress Notes (Signed)

## 2023-04-18 ENCOUNTER — Encounter: Payer: Self-pay | Admitting: Neurology

## 2023-06-25 ENCOUNTER — Ambulatory Visit: Admitting: Neurology

## 2023-06-30 DIAGNOSIS — L820 Inflamed seborrheic keratosis: Secondary | ICD-10-CM | POA: Diagnosis not present

## 2023-06-30 DIAGNOSIS — L281 Prurigo nodularis: Secondary | ICD-10-CM | POA: Diagnosis not present

## 2023-06-30 DIAGNOSIS — L538 Other specified erythematous conditions: Secondary | ICD-10-CM | POA: Diagnosis not present

## 2023-07-01 DIAGNOSIS — Z1231 Encounter for screening mammogram for malignant neoplasm of breast: Secondary | ICD-10-CM | POA: Diagnosis not present

## 2023-07-05 DIAGNOSIS — M810 Age-related osteoporosis without current pathological fracture: Secondary | ICD-10-CM | POA: Diagnosis not present

## 2023-07-05 DIAGNOSIS — E782 Mixed hyperlipidemia: Secondary | ICD-10-CM | POA: Diagnosis not present

## 2023-07-11 NOTE — Progress Notes (Unsigned)
 NEUROLOGY FOLLOW UP OFFICE NOTE  Shelly [name] [id number]  Assessment/Plan:   Chronic migraine with aura, without status migrainosus, not intractable   Start Depakote  ER 250mg  daily.  Increase to 500mg  at bedtime in 4-6 weeks if needed.  Check CBC and CMP today and again in 3 months.  If ineffective or has side effects, plan would be start Botox . For migraine rescue:  Ubrelvy  100mg , Zofran  for nausea.  May use Tylenol  or Motrin but limit to no more than 2 days out of week Follow up 6 months     Subjective:  Shelly [name] is a 78 year old female with gluten allergy, who follows up for migraine.  UPDATE: Started divalproex  but discontinued due to side effects.  She had on treatment of Botox  in March but discontinued further treatments due to ptosis.  Recommended trying propranolol but she deferred further pharmacologic preventative management.   Intensity:  moderate and severe Duration:  moderate last 1 hour with Ubrelvy , 24 hours for severe Frequency:  15 days a month (2 are severe)   Current NSAIDS/analgesics:  Motrin, Tylenol  Current triptans:  none Current ergotamine:  none Current anti-emetic:  Zofran  4mg  Current muscle relaxants:  none Current Antihypertensive medications:  none Current Antidepressant medications:  none Current Anticonvulsant medications:  none Current anti-CGRP:  Ubrelvy  100mg  Current Vitamins/Herbal/Supplements:  magnesium citrate 400mg  daily, CoQ10 300mg  daily and riboflavin 400mg  daily  Current Antihistamines/Decongestants:  meclizine 12.5-25mg  Other therapy:  none      Caffeine:  2 cups coffee daily Diet:  12 oz water daily  Ginger ale.  Does not skip meals Exercise:  swimming 3 days a week.  Before it got hot, walking 3 days a week Depression:  no; Anxiety:  no Sleep hygiene:  good   HISTORY: She started getting episodes of headache with dizziness and sinus symptoms several years ago, but became more frequent around 2020.  She was  previously diagnosed with vasomotor rhinitis due to the prominent rhinorrhea.  She describes onset of runny nose with diffuse and right sided severe pressure headache with bilateral facial and teeth pain.  Sometimes visual aura of jagged flashes in the eye.  Associated with nausea, vertigo, bilateral ear pressure, brain fog, trouble focusing eyes.  Lasts 24 hours.  Initially occurring every 9 days.  Treats with Tylenol  and sometimes Sudafed and meclizine.  Started increasing Tylenol  intake (daily) and now occurring almost daily.  Triggers include strong smells (air fresheners, Clorox), eye strain and air blowing in face.  Relieving factors include resting in dark room.  Evaluated by neurology, ENT, allergist and ophthalmology with negative workup.  Believed to be migraines.   MRI of brain and IACs with and without contrast on 06/21/2020 personally reviewed was unremarkable.   Past NSAIDS/analgesics:  tramadol  Past abortive triptans:  rizatriptan Past abortive ergotamine:  none Past muscle relaxants:  none Past anti-emetic:  Zofran  Past antihypertensive medications:  none Past antidepressant medications:  nortriptyline  (headache, blurred vision, dizziness, nausea) Past anticonvulsant medications:  divalproex  (sore throat, dysphagia, hoarseness, cough, swollen lips, nausea, headache) Past anti-CGRP:  Nurtec Past vitamins/Herbal/Supplements:  none Past antihistamines/decongestants:  Zyrtec Other past therapies:  none    Family history of headache:  father.  Other family history:  Sister has ALS  PAST MEDICAL HISTORY: Past Medical History:  Diagnosis Date   Allergic rhinitis    Anxiety    Arthritis    right hand   High cholesterol    Hypotension    Transient gluten sensitivity  diarrhea and stomaCH pains   Vasomotor rhinitis     MEDICATIONS: Current Outpatient Medications on File Prior to Visit  Medication Sig Dispense Refill   acetaminophen  (TYLENOL ) 500 MG tablet Take 1,000 mg by  mouth every 6 (six) hours as needed (headaches.).      botulinum toxin Type A  (BOTOX ) 200 units injection Inject 155 units IM into multiple site in the face,neck and head once every 90 days 1 each 4   calcium carbonate (SUPER CALCIUM) 1500 (600 Ca) MG TABS tablet Take 1,500 mg by mouth.     Cholecalciferol 50 MCG (2000 UT) TABS Take by mouth.     diazepam  (VALIUM ) 5 MG tablet Take one tablet by mouth with food one hour prior to procedure. May repeat 30 minutes prior if needed. 2 tablet 0   divalproex  (DEPAKOTE  ER) 250 MG 24 hr tablet Take 1 tablet (250 mg total) by mouth at bedtime. 30 tablet 5   EPINEPHrine 0.3 mg/0.3 mL IJ SOAJ injection Inject into the muscle as directed.     meclizine (ANTIVERT) 12.5 MG tablet Take 12.5-25 mg by mouth daily as needed.     ondansetron  (ZOFRAN -ODT) 4 MG disintegrating tablet DISSOLVE ONE TABLET ON TOP OF TONGUE EVERY 8 HOURS AS NEEDED FOR NAUSEA AND VOMITING 40 tablet 2   Pediatric Multivitamins-Iron (CHILD CHEWABLE VITAMINS/IRON) chewable tablet Chew 2 tablets by mouth every other day.     Ubrogepant  (UBRELVY ) 100 MG TABS TAKE 1 TABLET BY MOUTH ONCE AS NEEDED FOR EARLIEST ONSET OF MIGRAINE. MAY REPEAT IN 2 HOURS. MAX OF 2 TABLETS IN A 24 HOUR PERIOD 16 tablet 11   VITAMIN B COMPLEX-C PO Take 1 tablet by mouth daily.     No current facility-administered medications on file prior to visit.    ALLERGIES: Allergies  Allergen Reactions   Alpha-Gal Anaphylaxis    Throat swelling.   Atorvastatin Anaphylaxis   Cephalosporins     Throat swollen   Gelatin Anaphylaxis   Meat Extract Anaphylaxis   Alendronate Sodium Nausea And Vomiting   Doxycycline    Ezetimibe Other (See Comments)   Gluten Diarrhea and Nausea Only   Gluten Meal Diarrhea   Hyoscyamine Sulfate Nausea Only   Clindamycin Rash   Cortisone Rash   Paxil [Paroxetine] Rash   Penicillins Rash    Did it involve swelling of the face/tongue/throat, SOB, or low BP? No Did it involve sudden or severe  rash/hives, skin peeling, or any reaction on the inside of your mouth or nose? Yes Did you need to seek medical attention at a hospital or doctor's office? No When did it last happen? 5 years ago (~2015)  If all above answers are "NO", may proceed with cephalosporin use.    Prednisone Rash   Sulfonamide Derivatives Rash    FAMILY HISTORY: Family History  Problem Relation Age of Onset   Stroke Mother    Prostate cancer Father    Emphysema Father    Other Sister        Corticobasal syndrome   ALS Maternal Uncle       Objective:  *** General: No acute distress.  Patient appears well-groomed.   Head:  Normocephalic/atraumatic Neck:  Supple.  No paraspinal tenderness.  Full range of motion. Heart:  Regular rate and rhythm. Neuro:  ***    Juliene Dunnings, DO  CC:  [employer], MD

## 2023-07-12 ENCOUNTER — Encounter: Payer: Self-pay | Admitting: Neurology

## 2023-07-12 ENCOUNTER — Ambulatory Visit: Payer: Medicare Other | Admitting: Neurology

## 2023-07-12 VITALS — BP 110/72 | HR 90 | Ht 64.0 in | Wt 141.4 lb

## 2023-07-12 DIAGNOSIS — G43109 Migraine with aura, not intractable, without status migrainosus: Secondary | ICD-10-CM | POA: Diagnosis not present

## 2023-07-12 MED ORDER — UBRELVY 100 MG PO TABS
ORAL_TABLET | ORAL | 11 refills | Status: AC
Start: 2023-07-12 — End: ?

## 2023-07-12 NOTE — Patient Instructions (Signed)
  Let me know if you want to restart Botox  Take Ubrelvy  at earliest onset of headache.  May repeat dose once in 2 hours if needed.  Maximum 2 tablets in 24 hours. Limit use of pain relievers to no more than 9 days out of the month.  These medications include acetaminophen , NSAIDs (ibuprofen/Advil/Motrin, naproxen/Aleve, triptans (Imitrex/sumatriptan), Excedrin, and narcotics.  This will help reduce risk of rebound headaches. Be aware of common food triggers Routine exercise Stay adequately hydrated (aim for 64 oz water daily) Keep headache diary Maintain proper stress management Maintain proper sleep hygiene Do not skip meals Consider supplements:  magnesium citrate 400mg  daily, riboflavin 400mg  daily, coenzyme Q10 100mg  three times daily.

## 2023-08-03 DIAGNOSIS — D485 Neoplasm of uncertain behavior of skin: Secondary | ICD-10-CM | POA: Diagnosis not present

## 2023-08-03 DIAGNOSIS — C44629 Squamous cell carcinoma of skin of left upper limb, including shoulder: Secondary | ICD-10-CM | POA: Diagnosis not present

## 2023-08-05 DIAGNOSIS — M810 Age-related osteoporosis without current pathological fracture: Secondary | ICD-10-CM | POA: Diagnosis not present

## 2023-08-05 DIAGNOSIS — E782 Mixed hyperlipidemia: Secondary | ICD-10-CM | POA: Diagnosis not present

## 2023-08-12 DIAGNOSIS — J449 Chronic obstructive pulmonary disease, unspecified: Secondary | ICD-10-CM | POA: Diagnosis not present

## 2023-08-12 DIAGNOSIS — Z87891 Personal history of nicotine dependence: Secondary | ICD-10-CM | POA: Diagnosis not present

## 2023-08-25 DIAGNOSIS — K591 Functional diarrhea: Secondary | ICD-10-CM | POA: Diagnosis not present

## 2023-08-25 DIAGNOSIS — R195 Other fecal abnormalities: Secondary | ICD-10-CM | POA: Diagnosis not present

## 2023-08-25 DIAGNOSIS — R110 Nausea: Secondary | ICD-10-CM | POA: Diagnosis not present

## 2023-08-25 DIAGNOSIS — K921 Melena: Secondary | ICD-10-CM | POA: Diagnosis not present

## 2023-08-31 DIAGNOSIS — K649 Unspecified hemorrhoids: Secondary | ICD-10-CM | POA: Diagnosis not present

## 2023-08-31 DIAGNOSIS — K644 Residual hemorrhoidal skin tags: Secondary | ICD-10-CM | POA: Diagnosis not present

## 2023-08-31 DIAGNOSIS — K921 Melena: Secondary | ICD-10-CM | POA: Diagnosis not present

## 2023-09-05 DIAGNOSIS — M810 Age-related osteoporosis without current pathological fracture: Secondary | ICD-10-CM | POA: Diagnosis not present

## 2023-09-05 DIAGNOSIS — E782 Mixed hyperlipidemia: Secondary | ICD-10-CM | POA: Diagnosis not present

## 2023-09-15 DIAGNOSIS — C44629 Squamous cell carcinoma of skin of left upper limb, including shoulder: Secondary | ICD-10-CM | POA: Diagnosis not present

## 2023-10-04 ENCOUNTER — Inpatient Hospital Stay

## 2023-10-04 ENCOUNTER — Ambulatory Visit (HOSPITAL_COMMUNITY)

## 2023-10-05 DIAGNOSIS — E782 Mixed hyperlipidemia: Secondary | ICD-10-CM | POA: Diagnosis not present

## 2023-10-05 DIAGNOSIS — M810 Age-related osteoporosis without current pathological fracture: Secondary | ICD-10-CM | POA: Diagnosis not present

## 2023-10-06 ENCOUNTER — Telehealth: Payer: Self-pay | Admitting: *Deleted

## 2023-10-06 NOTE — Telephone Encounter (Signed)
 PC to patient regarding canceled CT scan - she states she is sick & thinks she has the flu.  Central Scheduling number given so she can reschedule the scan.  Appointment with Dr Sherrod on 10/11/23 canceled, she will reschedule this as well.

## 2023-10-11 ENCOUNTER — Ambulatory Visit: Admitting: Internal Medicine

## 2023-10-12 ENCOUNTER — Other Ambulatory Visit: Payer: Self-pay | Admitting: Medical Oncology

## 2023-10-12 DIAGNOSIS — C7A090 Malignant carcinoid tumor of the bronchus and lung: Secondary | ICD-10-CM

## 2023-10-13 ENCOUNTER — Ambulatory Visit: Admitting: Internal Medicine

## 2023-10-13 ENCOUNTER — Telehealth: Payer: Self-pay | Admitting: Internal Medicine

## 2023-10-13 ENCOUNTER — Other Ambulatory Visit

## 2023-10-13 NOTE — Telephone Encounter (Signed)
 Rescheduled appointments with the patient per the patients request. She is aware of the updated details.

## 2023-10-28 ENCOUNTER — Other Ambulatory Visit: Payer: Self-pay

## 2023-10-28 DIAGNOSIS — C7A090 Malignant carcinoid tumor of the bronchus and lung: Secondary | ICD-10-CM

## 2023-10-29 ENCOUNTER — Inpatient Hospital Stay: Attending: Internal Medicine

## 2023-10-29 ENCOUNTER — Ambulatory Visit (HOSPITAL_COMMUNITY)
Admission: RE | Admit: 2023-10-29 | Discharge: 2023-10-29 | Disposition: A | Discharge status: Home / Self Care | Source: Ambulatory Visit | Attending: Internal Medicine | Admitting: Internal Medicine

## 2023-10-29 DIAGNOSIS — C7A090 Malignant carcinoid tumor of the bronchus and lung: Secondary | ICD-10-CM | POA: Insufficient documentation

## 2023-10-29 DIAGNOSIS — R918 Other nonspecific abnormal finding of lung field: Secondary | ICD-10-CM | POA: Diagnosis not present

## 2023-10-29 DIAGNOSIS — I700 Atherosclerosis of aorta: Secondary | ICD-10-CM | POA: Diagnosis not present

## 2023-10-29 LAB — CBC WITH DIFFERENTIAL (CANCER CENTER ONLY)
Abs Immature Granulocytes: 0.06 K/uL (ref 0.00–0.07)
Basophils Absolute: 0.1 K/uL (ref 0.0–0.1)
Basophils Relative: 1 %
Eosinophils Absolute: 0.2 K/uL (ref 0.0–0.5)
Eosinophils Relative: 2 %
HCT: 38.6 % (ref 36.0–46.0)
Hemoglobin: 12.8 g/dL (ref 12.0–15.0)
Immature Granulocytes: 1 %
Lymphocytes Relative: 36 %
Lymphs Abs: 2.6 K/uL (ref 0.7–4.0)
MCH: 30.1 pg (ref 26.0–34.0)
MCHC: 33.2 g/dL (ref 30.0–36.0)
MCV: 90.8 fL (ref 80.0–100.0)
Monocytes Absolute: 0.7 K/uL (ref 0.1–1.0)
Monocytes Relative: 9 %
Neutro Abs: 3.6 K/uL (ref 1.7–7.7)
Neutrophils Relative %: 51 %
Platelet Count: 372 K/uL (ref 150–400)
RBC: 4.25 MIL/uL (ref 3.87–5.11)
RDW: 13 % (ref 11.5–15.5)
WBC Count: 7.2 K/uL (ref 4.0–10.5)
nRBC: 0 % (ref 0.0–0.2)

## 2023-10-29 LAB — CMP (CANCER CENTER ONLY)
ALT: 13 U/L (ref 0–44)
AST: 17 U/L (ref 15–41)
Albumin: 4.3 g/dL (ref 3.5–5.0)
Alkaline Phosphatase: 68 U/L (ref 38–126)
Anion gap: 6 (ref 5–15)
BUN: 10 mg/dL (ref 8–23)
CO2: 31 mmol/L (ref 22–32)
Calcium: 9.4 mg/dL (ref 8.9–10.3)
Chloride: 104 mmol/L (ref 98–111)
Creatinine: 0.86 mg/dL (ref 0.44–1.00)
GFR, Estimated: >60 mL/min (ref >60)
Glucose, Bld: 97 mg/dL (ref 70–99)
Potassium: 3.8 mmol/L (ref 3.5–5.1)
Sodium: 141 mmol/L (ref 135–145)
Total Bilirubin: 0.4 mg/dL (ref 0.0–1.2)
Total Protein: 7.4 g/dL (ref 6.5–8.1)

## 2023-10-29 MED ORDER — IOHEXOL 300 MG/ML  SOLN
75.0000 mL | Freq: Once | INTRAMUSCULAR | Status: AC | PRN
  Administered 2023-10-29: 75 mL via INTRAVENOUS

## 2023-11-04 ENCOUNTER — Inpatient Hospital Stay: Admitting: Internal Medicine

## 2023-11-04 VITALS — BP 132/77 | HR 81 | Temp 97.6°F | Resp 17 | Ht 64.0 in | Wt 140.0 lb

## 2023-11-04 DIAGNOSIS — C7A090 Malignant carcinoid tumor of the bronchus and lung: Secondary | ICD-10-CM

## 2023-11-04 NOTE — Progress Notes (Signed)
 [facility] [phone]   [phone]  OFFICE PROGRESS NOTE  [name], MD [address] [place] [address]  DIAGNOSIS: stage IA (T1b, N0, M0) typical carcinoid tumor. The patient has multiple other pulmonary nodules that need close monitoring and observation.  PRIOR THERAPY: Status post wedge resection of right upper lobectomy with lymph node sampling under the care of Dr. Kerrin on 10/03/2018.  CURRENT THERAPY: Observation.  INTERVAL HISTORY: Shelly [name] 78 y.o. female returns to the clinic today for follow-up visit accompanied by her friend.Discussed the use of AI scribe software for clinical note transcription with the patient, who gave verbal consent to proceed.  History of Present Illness Shelly [name] is a 78 year old female with stage one E small cell lung cancer and typical carcinoid tumor who presents for evaluation with repeat CT scans for re-staging.  She has a history of stage one E small cell lung cancer and typical carcinoid tumor. She underwent a wedge resection of the right upper lobe in September 2020 and is currently in an observation period. No new chest pain, breathing issues, nausea, vomiting, diarrhea, or weight loss.  She experiences migraines, which started last night. She usually takes Ubrelvy  to manage them, but finds it ineffective when the migraine begins in the middle of the night. Her current medications include Ubrelvy  for migraines.  Approximately four to five weeks ago, she contracted COVID-19. She also had a squamous cell carcinoma removed from her left elbow area recently.       MEDICAL HISTORY: Past Medical History:  Diagnosis Date   Allergic rhinitis    Anxiety    Arthritis    right hand   High cholesterol    Hypotension    Transient gluten sensitivity    diarrhea and stomaCH pains   Vasomotor rhinitis     ALLERGIES:  is allergic to alpha-gal, atorvastatin,  cephalosporins, gelatin, meat extract, alendronate sodium, doxycycline, ezetimibe, gluten, gluten meal, hyoscyamine sulfate, clindamycin, cortisone, paxil [paroxetine], penicillins, prednisone, and sulfonamide derivatives.  MEDICATIONS:  Current Outpatient Medications  Medication Sig Dispense Refill   acetaminophen  (TYLENOL ) 500 MG tablet Take 1,000 mg by mouth every 6 (six) hours as needed (headaches.).      calcium carbonate (SUPER CALCIUM) 1500 (600 Ca) MG TABS tablet Take 1,500 mg by mouth.     Cholecalciferol 50 MCG (2000 UT) TABS Take by mouth.     co-enzyme Q-10 30 MG capsule Take 30 mg by mouth 3 (three) times daily.     EPINEPHrine 0.3 mg/0.3 mL IJ SOAJ injection Inject into the muscle as directed.     meclizine (ANTIVERT) 12.5 MG tablet Take 12.5-25 mg by mouth daily as needed.     ondansetron  (ZOFRAN -ODT) 4 MG disintegrating tablet DISSOLVE ONE TABLET ON TOP OF TONGUE EVERY 8 HOURS AS NEEDED FOR NAUSEA AND VOMITING (Patient taking differently: Take 4 mg by mouth as needed for vomiting or nausea.) 40 tablet 2   Pediatric Multivitamins-Iron (CHILD CHEWABLE VITAMINS/IRON) chewable tablet Chew 2 tablets by mouth every other day.     Ubrogepant  (UBRELVY ) 100 MG TABS TAKE 1 TABLET BY MOUTH ONCE AS NEEDED FOR EARLIEST ONSET OF MIGRAINE. MAY REPEAT IN 2 HOURS. MAX OF 2 TABLETS IN A 24 HOUR PERIOD 16 tablet 11   VITAMIN B COMPLEX-C PO Take 1 tablet by mouth daily.     No current facility-administered medications for this visit.    SURGICAL HISTORY:  Past  Surgical History:  Procedure Laterality Date   EUS N/A 06/28/2013   Procedure: ESOPHAGEAL ENDOSCOPIC ULTRASOUND (EUS) RADIAL;  Surgeon: Elsie Cree, MD;  Location: [facility];  Service: Endoscopy;  Laterality: N/A;   GALLBLADDER SURGERY  2005   GANGLION CYST EXCISION Left 2003   TUBAL LIGATION  1981   VIDEO ASSISTED THORACOSCOPY (VATS)/WEDGE RESECTION Right 10/03/2018   Procedure: VIDEO ASSISTED THORACOSCOPY (VATS)/WEDGE RESECTION  with multiple node dissection.;  Surgeon: Kerrin Elspeth BROCKS, MD;  Location: [facility] OR;  Service: Thoracic;  Laterality: Right;    REVIEW OF SYSTEMS:  A comprehensive review of systems was negative except for: Neurological: positive for headaches   PHYSICAL EXAMINATION: General appearance: alert, cooperative, and no distress Head: Normocephalic, without obvious abnormality, atraumatic Neck: no adenopathy, no JVD, supple, symmetrical, trachea midline, and thyroid  not enlarged, symmetric, no tenderness/mass/nodules Lymph nodes: Cervical, supraclavicular, and axillary nodes normal. Resp: clear to auscultation bilaterally Back: symmetric, no curvature. ROM normal. No CVA tenderness. Cardio: regular rate and rhythm, S1, S2 normal, no murmur, click, rub or gallop GI: soft, non-tender; bowel sounds normal; no masses,  no organomegaly Extremities: extremities normal, atraumatic, no cyanosis or edema  ECOG PERFORMANCE STATUS: 1 - Symptomatic but completely ambulatory  Blood pressure 132/77, pulse 81, temperature 97.6 F (36.4 C), temperature source Temporal, resp. rate 17, height 5' 4 (1.626 m), weight 140 lb (63.5 kg), SpO2 96%.  LABORATORY DATA: Lab Results  Component Value Date   WBC 7.2 10/29/2023   HGB 12.8 10/29/2023   HCT 38.6 10/29/2023   MCV 90.8 10/29/2023   PLT 372 10/29/2023      Chemistry      Component Value Date/Time   NA 141 10/29/2023 1433   K 3.8 10/29/2023 1433   CL 104 10/29/2023 1433   CO2 31 10/29/2023 1433   BUN 10 10/29/2023 1433   CREATININE 0.86 10/29/2023 1433   CREATININE 0.84 01/08/2023 1511      Component Value Date/Time   CALCIUM 9.4 10/29/2023 1433   ALKPHOS 68 10/29/2023 1433   AST 17 10/29/2023 1433   ALT 13 10/29/2023 1433   BILITOT 0.4 10/29/2023 1433       RADIOGRAPHIC STUDIES: CT Chest W Contrast Result Date: 10/31/2023 CLINICAL DATA:  Pulmonary carcinoid surveillance * Tracking Code: BO * EXAM: CT CHEST WITH CONTRAST TECHNIQUE:  Multidetector CT imaging of the chest was performed during intravenous contrast administration. RADIATION DOSE REDUCTION: This exam was performed according to the departmental dose-optimization program which includes automated exposure control, adjustment of the mA and/or kV according to patient size and/or use of iterative reconstruction technique. CONTRAST:  75mL OMNIPAQUE  IOHEXOL  300 MG/ML  SOLN COMPARISON:  09/30/2022 FINDINGS: Cardiovascular: Aortic atherosclerosis. Normal heart size. No pericardial effusion. Mediastinum/Nodes: No enlarged mediastinal, hilar, or axillary lymph nodes. Thyroid  gland, trachea, and esophagus demonstrate no significant findings. Lungs/Pleura: Unchanged postoperative appearance of the chest status post wedge resection of the posterior right upper lobe (series 8, image 62). Unchanged clustered tree-in-bud nodules in the peripheral right upper lobe (series 8, image 50). Largest nodule measures 0.8 cm (series 8, image 51). Additional clustered tree-in-bud nodularity in the peripheral left upper lobe, largest nodule measuring 0.8 cm (series 8, image 56). Multiple additional scattered small bilateral pulmonary nodules, for example a 0.6 cm nodule in the right lower lobe (series 8, image 106) and a 0.6 cm nodule in the central right middle lobe (series 8, image 78). Mild biapical pleural-parenchymal scarring. No pleural effusion or pneumothorax. Upper Abdomen: No acute abnormality.  Cholecystectomy. Musculoskeletal: No chest wall abnormality. No acute osseous findings. IMPRESSION: 1. Unchanged postoperative appearance of the chest status post wedge resection of the posterior right upper lobe. 2. Unchanged clustered tree-in-bud nodules in both the peripheral right upper and peripheral left upper lobes, morphologically infectious or inflammatory. Multiple additional scattered small bilateral pulmonary nodules unchanged and nonspecific. Aortic Atherosclerosis (ICD10-I70.0). Electronically  Signed   By: Marolyn JONETTA Jaksch M.D.   On: 10/31/2023 16:09     ASSESSMENT AND PLAN: This is a very pleasant 78 years old white female with a stage Ia carcinoid tumor of the lung status post wedge resection of the right upper lobe with lymph node sampling in September 2020 under the care of Dr. Kerrin. The patient is currently on observation and she is feeling fine with no concerning complaints. She had repeat CT scan of the chest performed recently.  I personally independently reviewed the scan and discussed the result with the patient and her friend today.  Her scan showed no concerning findings for disease recurrence or metastasis. Assessment and Plan Assessment & Plan Right upper lobe typical carcinoid tumor (lung) Status post wedge resection in September 2020, currently in remission with no evidence of recurrence. Recent lung inflammation likely due to COVID-19 infection, not concerning. - Discharge from oncology follow-up - Advise follow-up with family doctor, Dr. Kip - Instruct to return if new symptoms or concerns arise  Skin squamous cell carcinoma, left elbow Status post excision with no current concerns, adequately treated. She was advised to call immediately if she has any other concerning symptoms in the interval.  The patient voices understanding of current disease status and treatment options and is in agreement with the current care plan.  All questions were answered. The patient knows to call the clinic with any problems, questions or concerns. We can certainly see the patient much sooner if necessary.  Disclaimer: This note was dictated with voice recognition software. Similar sounding words can inadvertently be transcribed and may not be corrected upon review.

## 2023-11-05 DIAGNOSIS — M810 Age-related osteoporosis without current pathological fracture: Secondary | ICD-10-CM | POA: Diagnosis not present

## 2023-11-05 DIAGNOSIS — E782 Mixed hyperlipidemia: Secondary | ICD-10-CM | POA: Diagnosis not present

## 2023-11-15 ENCOUNTER — Telehealth: Payer: Self-pay | Admitting: Pharmacy Technician

## 2023-11-15 NOTE — Telephone Encounter (Signed)
 Pharmacy Patient Advocate Encounter   Received notification from CoverMyMeds that prior authorization for UBRELVY  100MG  is required/requested.   Insurance verification completed.   The patient is insured through [facility].   Per test claim: PA required; PA submitted to above mentioned insurance via Latent Key/confirmation #/EOC [id number] Status is pending

## 2023-11-16 ENCOUNTER — Other Ambulatory Visit (HOSPITAL_COMMUNITY): Payer: Self-pay

## 2023-11-16 ENCOUNTER — Telehealth: Payer: Self-pay | Admitting: Neurology

## 2023-11-16 NOTE — Telephone Encounter (Signed)
 Melia from [employer] called and stated that the prescription for  UBRELVY  100MG  has been approved. It is approved for 1 year starting 11-15-23. Melia will be faxing over the approval information. Thanks

## 2023-11-16 NOTE — Telephone Encounter (Signed)
 Pharmacy Patient Advocate Encounter  Received notification from [facility] that Prior Authorization for UBRELVY  100MG  has been APPROVED from 11.11.25 to 11.10.26. Ran test claim, Copay is $45. This test claim was processed through [facility]- copay amounts may vary at other pharmacies due to pharmacy/plan contracts, or as the patient moves through the different stages of their insurance plan.   PA #/Case ID/Reference #: [id number]

## 2023-11-25 ENCOUNTER — Encounter: Payer: Self-pay | Admitting: Neurology

## 2023-12-05 DIAGNOSIS — M810 Age-related osteoporosis without current pathological fracture: Secondary | ICD-10-CM | POA: Diagnosis not present

## 2023-12-05 DIAGNOSIS — E782 Mixed hyperlipidemia: Secondary | ICD-10-CM | POA: Diagnosis not present

## 2024-01-12 ENCOUNTER — Telehealth: Payer: Self-pay | Admitting: Physical Medicine and Rehabilitation

## 2024-01-12 NOTE — Telephone Encounter (Signed)
 Pt called requesting another injection with [place]. Last injection 08/20/22. Pt number is [phone].

## 2024-01-18 ENCOUNTER — Ambulatory Visit: Admitting: Physical Medicine and Rehabilitation

## 2024-01-21 ENCOUNTER — Ambulatory Visit: Admitting: Physical Medicine and Rehabilitation

## 2024-01-21 ENCOUNTER — Encounter: Payer: Self-pay | Admitting: Physical Medicine and Rehabilitation

## 2024-01-21 DIAGNOSIS — M5416 Radiculopathy, lumbar region: Secondary | ICD-10-CM

## 2024-01-21 DIAGNOSIS — M48062 Spinal stenosis, lumbar region with neurogenic claudication: Secondary | ICD-10-CM | POA: Diagnosis not present

## 2024-01-21 DIAGNOSIS — M5442 Lumbago with sciatica, left side: Secondary | ICD-10-CM

## 2024-01-21 DIAGNOSIS — G8929 Other chronic pain: Secondary | ICD-10-CM

## 2024-01-21 MED ORDER — DIAZEPAM 5 MG PO TABS: ORAL_TABLET | ORAL | 0 refills | Status: DC

## 2024-01-21 NOTE — Progress Notes (Signed)
 Pain Scale   Average Pain 5 Patient advising she had an injection 2023 and it helped with her lower back pain. Patient advising her pain radiates to right hip        +Driver, -BT, -Dye Allergies.

## 2024-01-21 NOTE — Progress Notes (Signed)
 "  Shelly [name] - 79 y.o. female MRN [id number]  Date of birth: 24-Oct-1945  Office Visit Note: Visit Date: 01/21/2024 PCP: [name], MD Referred by: [name], MD  Subjective: Chief Complaint  Patient presents with   Lower Back - Pain   HPI: Shelly [name] is a 79 y.o. female who comes in today for evaluation of chronic, worsening and severe left sided lower back pain radiating to buttock, hip and down lateral leg. Numbness to left buttock region. Pain ongoing for several years, worsens with standing and walking. She describes pain as sore, aching and cramping sensation, currently rates as 7 out of 10. Some relief of pain with home exercise regimen, rest and use of medications. Lumbar MRI imaging from 2023 shows mild-to-moderate facet and ligamentum flavum hypertrophy result in new mild spinal stenosis at L3-L4, moderate right and severe left facet and ligamentum flavum hypertrophy, and a 4 mm synovial cyst at the anterosuperior aspect of the left facet joint result in moderate spinal stenosis at L4-L5. She underwent left L5-S1 interlaminar epidural steroid injection in our office on 08/20/2022. She reports greater than 80% relief of pain for 2 years. Patient denies focal weakness. No recent trauma or falls.      Review of Systems  Musculoskeletal:  Positive for back pain.  Neurological:  Positive for tingling. Negative for focal weakness and weakness.  All other systems reviewed and are negative.  Otherwise per HPI.  Assessment & Plan: Visit Diagnoses:    ICD-10-CM   1. Chronic left-sided low back pain with left-sided sciatica  M54.42 Ambulatory referral to Physical Medicine Rehab   G89.29     2. Lumbar radiculopathy  M54.16 Ambulatory referral to Physical Medicine Rehab    3. Spinal stenosis of lumbar region with neurogenic claudication  M48.062 Ambulatory referral to Physical Medicine Rehab       Plan: Findings:  Chronic, worsening and severe left sided lower back  pain radiating to buttock, hip and down lateral leg. Paresthesias to left buttock. Patient continues to have severe pain despite good conservative therapies such as home exercise regimen, rest and use of medications. Patients clinical presentation and exam are consistent with neurogenic claudication as a result of spinal canal stenosis. Her pain pattern is most consistent with L5 distribution. We discussed treatment plan in detail today. Next step is to place order for diagnostic and hopefully therapeutic left L5-S1 interlaminar epidural steroid injection under fluoroscopic guidance. If good relief of pain with injection we can repeat this procedure infrequently as needed. I did call in pre-procedure Valium  for her to take on day of injection. She has severe anxiety with injection procedures. At some point, would consider obtaining new lumbar MRI imaging. She has no questions at this time. Her exam today is non focal, good strength noted to bilateral lower extremities.     Meds & Orders:  Meds ordered this encounter  Medications   diazepam  (VALIUM ) 5 MG tablet    Sig: Take one tablet by mouth with light food one hour prior to procedure.    Dispense:  1 tablet    Refill:  0    Orders Placed This Encounter  Procedures   Ambulatory referral to Physical Medicine Rehab    Follow-up: Return for Left L5-S1 interlaminar epidural steroid injection.   Procedures: No procedures performed      Clinical History: EXAM: MRI LUMBAR SPINE WITHOUT CONTRAST   TECHNIQUE: Multiplanar, multisequence MR imaging of the lumbar spine was performed. No intravenous  contrast was administered.   COMPARISON:  Lumbar spine MRI 08/17/2014   FINDINGS: Segmentation:  Standard.   Alignment:  Trace facet mediated anterolisthesis of L4 on L5.   Vertebrae: No fracture, suspicious marrow lesion, or evidence of discitis.   Conus medullaris and cauda equina: Conus extends to the lower L1 level. Conus and cauda equina  appear normal.   Paraspinal and other soft tissues: Bilateral renal cysts including a 3.8 cm cyst on the right with no routine follow-up imaging recommended.   Disc levels:   Disc desiccation throughout the lumbar spine.   T12-L1: Negative.   L1-2: Decreased size of a small central disc protrusion with annular fissure. No stenosis.   L2-3: Mild circumferential disc bulging, overall slightly regressed from the prior MRI. Mild facet hypertrophy. No significant stenosis.   L3-4: Mild disc bulging, small left foraminal disc protrusion, and mild-to-moderate facet and ligamentum flavum hypertrophy result in new mild spinal stenosis and mild left lateral recess stenosis without neural foraminal stenosis.   L4-5: Anterolisthesis with left eccentric bulging of uncovered disc, moderate right and severe left facet and ligamentum flavum hypertrophy, and a 4 mm synovial cyst at the anterosuperior aspect of the left facet joint result in moderate spinal stenosis and mild bilateral lateral recess stenosis without significant neural foraminal stenosis. Spinal and left lateral recess stenosis have improved compared to the prior MRI. There are small facet joint effusions.   L5-S1: Mild disc bulging and moderate to severe facet hypertrophy without stenosis, not significantly changed. Small right facet joint effusion.   IMPRESSION: 1. New mild spinal stenosis and mild left lateral recess stenosis at L3-4. 2. Moderate spinal stenosis at L4-5, improved from 2016.     Electronically Signed   By: Dasie Hamburg M.D.   On: 08/29/2021 13:05   She reports that she quit smoking about 5 years ago. Her smoking use included cigarettes. She started smoking about 62 years ago. She has a 14.3 pack-year smoking history. She has never used smokeless tobacco. No results for input(s): HGBA1C, LABURIC in the last 8760 hours.  Objective:  VS:  HT:    WT:   BMI:     BP:   HR: bpm  TEMP: ( )  RESP:   Physical Exam Vitals and nursing note reviewed.  HENT:     Head: Normocephalic and atraumatic.     Right Ear: External ear normal.     Left Ear: External ear normal.     Nose: Nose normal.     Mouth/Throat:     Mouth: Mucous membranes are moist.  Eyes:     Extraocular Movements: Extraocular movements intact.  Cardiovascular:     Rate and Rhythm: Normal rate.     Pulses: Normal pulses.  Pulmonary:     Effort: Pulmonary effort is normal.  Abdominal:     General: Abdomen is flat. There is no distension.  Musculoskeletal:        General: Tenderness present.     Cervical back: Normal range of motion.     Comments: Patient rises from seated position to standing without difficulty. Good lumbar range of motion. No pain noted with facet loading. 5/5 strength noted with bilateral hip flexion, knee flexion/extension, ankle dorsiflexion/plantarflexion and EHL. No clonus noted bilaterally. No pain upon palpation of greater trochanters. No pain with internal/external rotation of bilateral hips. Sensation intact bilaterally. Negative slump test bilaterally. Ambulates without aid, gait steady.     Skin:    General: Skin is warm and  dry.     Capillary Refill: Capillary refill takes less than 2 seconds.  Neurological:     General: No focal deficit present.     Mental Status: She is alert and oriented to person, place, and time.  Psychiatric:        Mood and Affect: Mood normal.        Behavior: Behavior normal.     Ortho Exam  Imaging: No results found.  Past Medical/Family/Surgical/Social History: Medications & Allergies reviewed per EMR, new medications updated. Patient Active Problem List   Diagnosis Date Noted   Impingement syndrome of left shoulder 10/08/2021   Migraine variant 01/27/2021   Vision disturbance 05/28/2020   Flushing 05/28/2020   Mallet finger of left hand 04/23/2020   Trigger finger, right ring finger 04/23/2020   Malignant carcinoid tumor of the bronchus and lung  (HCC) 05/15/2019   Nodule of upper lobe of right lung 10/03/2018   Cigarette smoker 09/05/2018   Pulmonary nodules 08/31/2011   Hemoptysis 08/31/2011   Anxiety state 02/13/2010   DEPRESSION 02/13/2010   DIARRHEA 02/13/2010   Periumbilical abdominal pain 02/13/2010   Past Medical History:  Diagnosis Date   Allergic rhinitis    Anxiety    Arthritis    right hand   High cholesterol    Hypotension    Transient gluten sensitivity    diarrhea and stomaCH pains   Vasomotor rhinitis    Family History  Problem Relation Age of Onset   Stroke Mother    Prostate cancer Father    Emphysema Father    Other Sister        Corticobasal syndrome   ALS Maternal Uncle    Past Surgical History:  Procedure Laterality Date   EUS N/A 06/28/2013   Procedure: ESOPHAGEAL ENDOSCOPIC ULTRASOUND (EUS) RADIAL;  Surgeon: Elsie Cree, MD;  Location: [facility];  Service: Endoscopy;  Laterality: N/A;   GALLBLADDER SURGERY  2005   GANGLION CYST EXCISION Left 2003   TUBAL LIGATION  1981   VIDEO ASSISTED THORACOSCOPY (VATS)/WEDGE RESECTION Right 10/03/2018   Procedure: VIDEO ASSISTED THORACOSCOPY (VATS)/WEDGE RESECTION with multiple node dissection.;  Surgeon: Kerrin Elspeth BROCKS, MD;  Location: [facility] OR;  Service: Thoracic;  Laterality: Right;   Social History   Occupational History   Occupation: Retired  Tobacco Use   Smoking status: Former    Current packs/day: 0.00    Average packs/day: 0.3 packs/day for 57.0 years (14.3 ttl pk-yrs)    Types: Cigarettes    Start date: 09/28/1961    Quit date: 09/29/2018    Years since quitting: 5.3   Smokeless tobacco: Never  Vaping Use   Vaping status: Never Used  Substance and Sexual Activity   Alcohol use: No   Drug use: No   Sexual activity: Not on file    "

## 2024-01-24 ENCOUNTER — Other Ambulatory Visit: Payer: Self-pay | Admitting: Physical Medicine and Rehabilitation

## 2024-01-24 ENCOUNTER — Telehealth: Payer: Self-pay

## 2024-01-24 NOTE — Telephone Encounter (Signed)
 Pre Procedural Valium 

## 2024-01-25 ENCOUNTER — Telehealth: Payer: Self-pay | Admitting: Radiology

## 2024-01-25 NOTE — Telephone Encounter (Signed)
 Montie Grade (KeyBETHA SHALL) - [phone] [id number]  5MG  tablets status: PA Response - ApprovedCreated: January 16th, 2026 [phone]: January 19th, 2026

## 2024-02-10 ENCOUNTER — Encounter: Admitting: Physical Medicine and Rehabilitation

## 2024-02-11 ENCOUNTER — Other Ambulatory Visit: Payer: Self-pay | Admitting: Physical Medicine and Rehabilitation

## 2024-02-11 ENCOUNTER — Encounter: Payer: Self-pay | Admitting: Physical Medicine and Rehabilitation

## 2024-02-11 MED ORDER — DIAZEPAM 5 MG PO TABS: ORAL_TABLET | ORAL | 0 refills | Status: AC

## 2024-02-17 ENCOUNTER — Encounter: Admitting: Physical Medicine and Rehabilitation

## 2024-03-01 ENCOUNTER — Encounter: Admitting: Orthopedic Surgery

## 2024-03-13 ENCOUNTER — Ambulatory Visit: Admitting: Neurology

## 2024-03-15 ENCOUNTER — Ambulatory Visit: Admitting: Neurology
